# Patient Record
Sex: Male | Born: 1935 | Race: White | Hispanic: No | Marital: Married | Smoking: Never smoker
Health system: Southern US, Community
[De-identification: ages and names within clinical notes are randomized; demographics above are authoritative.]

## PROBLEM LIST (undated history)

## (undated) DIAGNOSIS — N179 Acute kidney failure, unspecified: Secondary | ICD-10-CM

## (undated) DIAGNOSIS — N400 Enlarged prostate without lower urinary tract symptoms: Secondary | ICD-10-CM

## (undated) DIAGNOSIS — Z978 Presence of other specified devices: Secondary | ICD-10-CM

## (undated) DIAGNOSIS — N189 Chronic kidney disease, unspecified: Secondary | ICD-10-CM

## (undated) DIAGNOSIS — H547 Unspecified visual loss: Secondary | ICD-10-CM

## (undated) DIAGNOSIS — H9193 Unspecified hearing loss, bilateral: Secondary | ICD-10-CM

## (undated) DIAGNOSIS — E039 Hypothyroidism, unspecified: Secondary | ICD-10-CM

## (undated) DIAGNOSIS — Z960 Presence of urogenital implants: Secondary | ICD-10-CM

## (undated) DIAGNOSIS — M1990 Unspecified osteoarthritis, unspecified site: Secondary | ICD-10-CM

## (undated) DIAGNOSIS — G709 Myoneural disorder, unspecified: Secondary | ICD-10-CM

## (undated) HISTORY — PX: HERNIA REPAIR: SHX51

## (undated) HISTORY — PX: KNEE ARTHROSCOPY WITH EXCISION BAKER'S CYST: SHX5646

## (undated) HISTORY — PX: VASECTOMY: SHX75

## (undated) HISTORY — PX: BACK SURGERY: SHX140

## (undated) HISTORY — PX: TESTICLE REMOVAL: SHX68

## (undated) HISTORY — PX: CATARACT EXTRACTION: SUR2

## (undated) HISTORY — PX: CARPAL TUNNEL RELEASE: SHX101

## (undated) HISTORY — PX: EYE SURGERY: SHX253

## (undated) HISTORY — PX: KYPHOPLASTY: SHX5884

## (undated) HISTORY — PX: SEPTOPLASTY: SUR1290

## (undated) HISTORY — PX: ABDOMINAL HERNIA REPAIR: SHX539

---

## 1997-09-12 ENCOUNTER — Emergency Department (HOSPITAL_COMMUNITY): Admission: EM | Admit: 1997-09-12 | Discharge: 1997-09-12 | Payer: Self-pay | Admitting: Emergency Medicine

## 1997-10-08 ENCOUNTER — Encounter: Payer: Self-pay | Admitting: Otolaryngology

## 1997-10-09 ENCOUNTER — Ambulatory Visit (HOSPITAL_COMMUNITY): Admission: RE | Admit: 1997-10-09 | Discharge: 1997-10-09 | Payer: Self-pay | Admitting: Otolaryngology

## 1999-10-06 ENCOUNTER — Ambulatory Visit (HOSPITAL_BASED_OUTPATIENT_CLINIC_OR_DEPARTMENT_OTHER): Admission: RE | Admit: 1999-10-06 | Discharge: 1999-10-06 | Payer: Self-pay | Admitting: Otolaryngology

## 2000-04-18 ENCOUNTER — Encounter: Admission: RE | Admit: 2000-04-18 | Discharge: 2000-04-18 | Payer: Self-pay | Admitting: Internal Medicine

## 2000-04-18 ENCOUNTER — Encounter: Payer: Self-pay | Admitting: Internal Medicine

## 2002-04-29 ENCOUNTER — Encounter: Payer: Self-pay | Admitting: Ophthalmology

## 2002-04-29 ENCOUNTER — Ambulatory Visit (HOSPITAL_COMMUNITY): Admission: RE | Admit: 2002-04-29 | Discharge: 2002-04-30 | Payer: Self-pay | Admitting: Ophthalmology

## 2002-08-19 ENCOUNTER — Encounter: Admission: RE | Admit: 2002-08-19 | Discharge: 2002-08-19 | Payer: Self-pay | Admitting: Internal Medicine

## 2002-08-19 ENCOUNTER — Encounter: Payer: Self-pay | Admitting: Internal Medicine

## 2002-09-18 ENCOUNTER — Encounter: Payer: Self-pay | Admitting: Internal Medicine

## 2002-09-18 ENCOUNTER — Encounter: Admission: RE | Admit: 2002-09-18 | Discharge: 2002-09-18 | Payer: Self-pay | Admitting: Internal Medicine

## 2002-10-17 ENCOUNTER — Ambulatory Visit (HOSPITAL_COMMUNITY): Admission: RE | Admit: 2002-10-17 | Discharge: 2002-10-17 | Payer: Self-pay | Admitting: Ophthalmology

## 2003-08-05 ENCOUNTER — Emergency Department (HOSPITAL_COMMUNITY): Admission: EM | Admit: 2003-08-05 | Discharge: 2003-08-05 | Payer: Self-pay | Admitting: Family Medicine

## 2003-11-27 ENCOUNTER — Encounter: Admission: RE | Admit: 2003-11-27 | Discharge: 2003-11-27 | Payer: Self-pay | Admitting: Neurosurgery

## 2003-11-28 ENCOUNTER — Encounter: Admission: RE | Admit: 2003-11-28 | Discharge: 2003-11-28 | Payer: Self-pay | Admitting: Orthopedic Surgery

## 2003-12-24 ENCOUNTER — Ambulatory Visit (HOSPITAL_COMMUNITY): Admission: RE | Admit: 2003-12-24 | Discharge: 2003-12-24 | Payer: Self-pay | Admitting: Gastroenterology

## 2003-12-24 ENCOUNTER — Encounter (INDEPENDENT_AMBULATORY_CARE_PROVIDER_SITE_OTHER): Payer: Self-pay | Admitting: Specialist

## 2004-08-19 ENCOUNTER — Ambulatory Visit (HOSPITAL_COMMUNITY): Admission: RE | Admit: 2004-08-19 | Discharge: 2004-08-19 | Payer: Self-pay | Admitting: Ophthalmology

## 2006-07-12 ENCOUNTER — Encounter: Admission: RE | Admit: 2006-07-12 | Discharge: 2006-07-12 | Payer: Self-pay | Admitting: Urology

## 2006-07-16 ENCOUNTER — Ambulatory Visit (HOSPITAL_BASED_OUTPATIENT_CLINIC_OR_DEPARTMENT_OTHER): Admission: RE | Admit: 2006-07-16 | Discharge: 2006-07-16 | Payer: Self-pay | Admitting: Urology

## 2006-07-16 ENCOUNTER — Encounter (INDEPENDENT_AMBULATORY_CARE_PROVIDER_SITE_OTHER): Payer: Self-pay | Admitting: Urology

## 2007-03-28 ENCOUNTER — Emergency Department (HOSPITAL_COMMUNITY): Admission: EM | Admit: 2007-03-28 | Discharge: 2007-03-28 | Payer: Self-pay | Admitting: Family Medicine

## 2007-03-29 ENCOUNTER — Emergency Department (HOSPITAL_COMMUNITY): Admission: EM | Admit: 2007-03-29 | Discharge: 2007-03-29 | Payer: Self-pay | Admitting: Emergency Medicine

## 2007-03-30 ENCOUNTER — Emergency Department (HOSPITAL_COMMUNITY): Admission: EM | Admit: 2007-03-30 | Discharge: 2007-03-30 | Payer: Self-pay | Admitting: Emergency Medicine

## 2007-05-18 ENCOUNTER — Emergency Department (HOSPITAL_COMMUNITY): Admission: EM | Admit: 2007-05-18 | Discharge: 2007-05-18 | Payer: Self-pay | Admitting: Family Medicine

## 2007-08-03 ENCOUNTER — Emergency Department (HOSPITAL_COMMUNITY): Admission: EM | Admit: 2007-08-03 | Discharge: 2007-08-03 | Payer: Self-pay | Admitting: Emergency Medicine

## 2008-05-01 ENCOUNTER — Encounter: Admission: RE | Admit: 2008-05-01 | Discharge: 2008-05-01 | Payer: Self-pay | Admitting: Internal Medicine

## 2010-03-06 ENCOUNTER — Encounter: Payer: Self-pay | Admitting: Orthopedic Surgery

## 2010-06-28 NOTE — Op Note (Signed)
[name], [name]               ACCOUNT NO.:  [id number]   MEDICAL RECORD NO.:  [id number]          PATIENT TYPE:  AMB   LOCATION:  [facility]                         FACILITY:  [facility]   PHYSICIAN:  Mark C. Vernie Ammons, M.D.  DATE OF BIRTH:  [birth year]   DATE OF PROCEDURE:  07/16/2006  DATE OF DISCHARGE:                               OPERATIVE REPORT   PREOP DIAGNOSIS:  Chronic left testicular pain.   POSTOP DIAGNOSIS:  Chronic left testicular pain.   PROCEDURE:  Scrotal exploration and left simple orchiectomy.   SURGEON:  Mark C. Vernie Ammons, M.D.   ANESTHESIA:  General with local supplements.   SPECIMENS:  Left testicle to pathology.   BLOOD LOSS:  Less than 1 mL.   COMPLICATIONS:  None.   INDICATIONS:  The patient is a 77 year old white male who has had a  longstanding chronic left testicular pain.  I have tried multiple  antibiotics without improvement.  It had gotten to the point where I was  trying just to control the pain with Lyrica and that was marginally  beneficial.  I have discussed with the patient the options; and we have  elected to proceed with removal of the testicle.  The risks,  complication, and alternatives were discussed.  The patient understands  and elects to proceed.   DESCRIPTION OF OPERATION:  After informed consent, the patient was  brought to the major OR, and placed on the table and administered  general anesthesia.  Scrotum was sterilely prepped and draped.  A  midline median raphe skin incision was then made; and scrotal  exploration was undertaken.  Once entering the skin.  I then explored  the left hemi scrotum; and found no definite abnormality within.  The  testicle was palpably normal although somewhat atrophic.  I then  delivered the testicle and further exploration revealed no definite  masses or growths associated with the testicle.   I then used blunt dissection to dissect up the cord nearly to the  external inguinal ring.  I then divided  the cord into two equal  portions; and used Kelly clamps on each portion dividing the cord  distally to the clamps.  I then doubly ligated each portion of the cord  with 2-0 Vicryl suture first placing a 2-0 Vicryl tie, then a 2-0 Vicryl  suture ligature.  No bleeding was noted; and the cord was allowed to  drop back into its normal anatomic position.  Further exploration of the  scrotum revealed no abnormality within the scrotum and no bleeding  points were noted.   I injected a total of 12 mL of 1/2% plain Marcaine in the subcu tissue  and closed the skin with running 3-0 chromic suture.  A sterile dressing  as well as fluffed Kerlix and scrotal support were applied.  The patient  was then awakened and taken to recovery in stable satisfactory  condition.  He tolerated the procedure well; there were no  intraoperative complications.   He will be given a prescription for Vicoprofen 1-2 q.4 h. p.r.n. pain  #30.  He will follow up in  my office in 2 weeks; and I will contact him  with results of the pathology as his left testicle and associated  spermatic cord have been sent to pathology for pathologic evaluation.      Mark C. Vernie Ammons, M.D.  Electronically Signed     MCO/MEDQ  D:  07/16/2006  T:  07/16/2006  Job:  [id number]

## 2010-07-01 NOTE — Op Note (Signed)
NAME:  [name], [name]                         ACCOUNT NO.:  [id number]   MEDICAL RECORD NO.:  [id number]                   PATIENT TYPE:  OIB   LOCATION:  5735                                 FACILITY:  [facility]   PHYSICIAN:  Guadelupe Sabin, M.D.             DATE OF BIRTH:  September 19, 1925   DATE OF PROCEDURE:  04/29/2002  DATE OF DISCHARGE:  04/30/2002                                 OPERATIVE REPORT   PREOPERATIVE DIAGNOSIS:  Rhegmatogenous retinal detachment, left eye.   POSTOPERATIVE DIAGNOSIS:  Rhegmatogenous retinal detachment, left eye.   OPERATION PERFORMED:  1. Scleral buckling procedure, left eye, using solid silicone implants, #277     and 240.  2. Cryoapplication and diathermy application, external drainage of     subretinal fluid.   SURGEON:  Guadelupe Sabin, M.D.   ASSISTANT:  Nurse.   ANESTHESIA:  General.   OPHTHALMOSCOPY:  As previously described.   DESCRIPTION OF OPERATION:  After the patient was prepped and draped lid  traction sutures were placed in the left upper and lower lids.  The lid  speculum was inserted.  A peritomy was performed adjacent to the limbus 360  degrees.  The subconjunctival tissue was cleaned and the rectus muscles  isolated and looped with 4-0 silk traction sutures.  The sclera was  inspected and felt to be of satisfactory thickness for lamellar scleral  dissection.  Localization of the retinal tear was then performed using the  retinal cryoprobe and the tear itself treated with cryoapplications.  Lamellar scleral dissection was then carried out from the 10 to 2 o'clock  position, the bed measuring 9 mm in width.  Light diathermy applications  were applied to the inner scleral lamella.  A total of three 4-0 green  Mersilene sutures were used to close the scleral flap loosely over a trimmed  #277 solid silicone implant.  A 240 solid silicone encircling band was  placed about the globe, tied with two sutures of 4-0 green Mersilene at  the  4 o'clock position and anchoring sutures of 5-0 white dacron were placed at  the 4:30 and 8 o'clock positions to hold the encircling band in place.   After repeat indirect ophthalmoscopy it was elected to drain fluid at the  12:30 position in the bed.  An incision was made through the inner scleral  lamella, the choroid treated with direct diathermy applications and then  perforated with the 10 electrode.  A moderate amount of subretinal fluid  drained.  The retina was inspected revealing flattening of the retina.  An  additional site, however, was then utilized as there was no subretinal fluid  remaining at the previously treated areas.  A second site was performed at  the 1:30 position at the end of the buckle.  Perforation again revealed  further drainage of subretinal fluid.  The eye was becoming quite hypotonus  and it was elected to  inject 1 mL of filtered air through a pars plana  injection with a 30-gauge needle.  This reestablished the intraocular  pressure.  Fundus inspection revealed flattening of the retina, although  there still was a meridional wrinkle at the 12:30 position.  It was felt  that with the intraocular tamponade of the air bubble that the retina would  further settle in place.  It was therefore elected to close.   The tension of the encircling band was adjusted.  Tenon's capsule was pulled  forward in the four quadrants and tied as a separate layer with a 6-0  chromic catgut suture.  The conjunctiva was then pulled forward with a  running 6-0 chromic catgut suture.  Depo-Garamycin and dexamethasone were  injected in the sub-Tenon's space inferiorly.  Maxitrol and atropine  ointments were instilled in the conjunctival cul-de-sac.  A light patch and  protective shield were applied to the operated left eye.   DURATION OF PROCEDURE:  One-and-one-half hours.   CONDITION:  The patient tolerated the procedure well in general and left the  operating room for the  recovery room in good condition.  The patient was  taken to the recovery room and subsequently to the 23-hour observation unit.  The patient was to be positioned on his back with the head elevated at least  at 45 degrees to allow the intraocular air bubble to tamponade the break.  Schiotz tonometry at the end of the procedure was recorded at five to six  scale units with a 5.5 gram weight indicating a normotensive eye.                                                 Guadelupe Sabin, M.D.    HNJ/MEDQ  D:  07/25/2002  T:  07/25/2002  Job:  [id number]

## 2010-07-01 NOTE — Op Note (Signed)
NAMEFIELD, STANISZEWSKI NO.:  [id number]   MEDICAL RECORD NO.:  [id number]          PATIENT TYPE:  OIB   LOCATION:  2899                         FACILITY:  [facility]   PHYSICIAN:  Guadelupe Sabin, M.D.DATE OF BIRTH:  January 24, 1926   DATE OF PROCEDURE:  DATE OF DISCHARGE:                                 OPERATIVE REPORT   PREOPERATIVE DIAGNOSIS:  Senile nuclear brunescent cataract, right eye.   POSTOPERATIVE DIAGNOSIS:  Senile nuclear brunescent cataract, right eye.   DATE OF OPERATION:  Planned extracapsular cataract extraction,  phacoemulsification, primary insertion of posterior chamber intraocular lens  implant.   SURGEON:  Guadelupe Sabin, M.D.   ASSISTANT:  Nurse.   ANESTHESIA:  Local 4% Xylocaine, 0.75% Marcaine retrobulbar block was Wydase  added, topical tetracaine, intraocular Xylocaine, anesthesia standby  required in this patient.  The patient was given sodium Pentothal  intravenously during the period of retrobulbar blocking.   OPERATIVE PROCEDURE:  After the patient was prepped and draped, a lid  speculum was inserted in the right eye.  The eye was turned downward and a  superior rectus traction suture placed.  Schiotz tonometry was recorded at  seven scale units with a 5.5 g weight.  A peritomy was performed adjacent to  the limbus from the 11 to 1 o'clock position.  The corneoscleral junction  was cleaned and a corneoscleral groove made with a 45 degree Superblade.  The anterior chamber was then entered with a 2.5 mm diamond keratome at the  12 o'clock position and a 15 degrees blade at the 2:30 position.  Using a  bent 26 gauge needle on an Ocucoat syringe, a circular capsulorrhexis was  begun and then completed with the Utrata forceps.  Hydrodissection and  hydrodelineation were performed using 1% Xylocaine.  The 30 degree  phacoemulsification tip was then inserted with slow emulsification of the  brunescent, firm nucleus.  Back-cracking was  achieved with the Bechert pick.  Total ultrasonic time 1 minute 59 seconds, average power level 18%, total  amount of fluid used 90 mL.  Following removal of the nucleus, the residual  cortex was aspirated with the silicone-tipped irrigation-aspiration probe.  The posterior capsule appeared intact.  It was therefore elected to insert  an [employer] SI40NB silicone three-piece posterior chamber  intraocular lens implant, diopter strength +17.00.  This was inserted with  the McDonald forceps into the anterior chamber and then centered into the  capsular bag using the [facility] lens rotator.  The lens appeared to be well-  centered.  The Ocucoat and Provisc which had been used intermittently during  the procedures were aspirated and replaced with balanced salt solution and  Miochol ophthalmic solution.  The operative incisions appeared to be self-  sealing.  It was, however, elected to place a single 10-0 interrupted nylon  suture across the 12 o'clock incision to ensure closure and  to prevent endophthalmitis.  Pilopine ophthalmic gel and Maxitrol ointment  were instilled in the conjunctival cul-de-sac and a light patch and  protective shield applied.  Duration of procedure and anesthesia  administration 45  minutes.  The patient tolerated the procedure well in  general, left the operating room for the recovery room in good condition.       HNJ/MEDQ  D:  08/19/2004  T:  08/19/2004  Job:  [id number]

## 2010-07-01 NOTE — H&P (Signed)
NAME:  [name], [name]                         ACCOUNT NO.:  [id number]   MEDICAL RECORD NO.:  [id number]                   PATIENT TYPE:  OIB   LOCATION:  5735                                 FACILITY:  [facility]   PHYSICIAN:  Guadelupe Sabin, M.D.             DATE OF BIRTH:  [birth year]   DATE OF ADMISSION:  04/29/2002  DATE OF DISCHARGE:  04/30/2002                                HISTORY & PHYSICAL   IDENTIFYING INFORMATION/JUSTIFICATION FOR ADMISSION AND CARE:  This is an  urgent outpatient admission of this 77 year old white male admitted with a  rhegmatogenous retinal detachment of the left eye.   PRESENT ILLNESS:  This patient has been followed in my office intermittently  since December 08, 1993.  At that time the patient was complaining of  floaters and flashes in his right eye.  Examination revealed an acute  posterior vitreous detachment with mild vitreous hemorrhage associated with  early cataract formation.  The patient limited his activity and no retinal  detachment developed.  The patient, however, began to note the sudden onset  of similar symptoms in his left eye.  He returned to the office and was  found to have a superior rhegmatogenous retinal detachment with a horseshoe  tear located at the 12:30 position.  The patient was given oral discussion  and printed information concerning the proposed procedure and its possible  complications.  The patient signed an informed consent and arrangements were  made for his outpatient admission at this time.   PAST MEDICAL HISTORY:  The patient has also had recent cataract implant  surgery of the left eye performed by Dr. Cherly Hensen in October 2003.   REVIEW OF SYSTEMS:  No cardiorespiratory complaints.   MEDICATIONS:  The patient has been taking levofloxacin from Dr. Vernie Ammons for  a recent urinary tract infection.  Also the patient takes Cardura one tablet  at bedtime and doxycycline.  He has been taking Refresh ophthalmic  solution  as needed for eye dryness.   ALLERGIES:  SULFA and CIPRO.   PHYSICAL EXAMINATION:  VITAL SIGNS:  As recorded on admission.  Blood  pressure 132/79, heart rate 107 and temperature 98.8.  GENERAL APPEARANCE:  The patient is a pleasant, well-nourished, well-  developed white male in no acute distress, except for his eyes.  HEENT:  Eyes:  Visual acuity with correction 20/40 +2, right eye; less than  20/400, finger counting, left eye.  The patient has an inferior half or more  visual field defect in the left eye.  Applanation tonometry 16 mm right eye  and 18 left eye.  External ocular and slit lamp examination:  The eyes are  what and clear.  A cataract incision can be seen in the left eye at the 11  o'clock position.  The corneae are clear.  Anterior chamber deep and clear.  A dense nuclear cataract is present in the right eye and  there appears to be  some residual cortex remaining in the left eye.  A posterior chamber  intraocular lens implant is present and the posterior capsule appears open.  Detailed fundus examination:  The right eye dilated; a dim view due to dense  cataract formation.  The vitreous is clear.  The retina is attached and no  retinal tear or detachment area is seen.  Left eye; the vitreous is clear.  The retina is detached with a bullous rhegmatogenous retinal detachment  superiorly with a horseshoe tear at the 12:30 position.  The macular area is  detachment.  CHEST AND LUNGS:  Clear to percussion and auscultation.  HEART:  Normal sinus rhythm.  No cardiomegaly.  No murmurs.  ABDOMEN: Negative.  EXTREMITIES: Negative.   ADMISSION DIAGNOSES:  1. Rhegmatogenous retinal detachment, left eye.  2. Pseudophakia, left eye.   SURGICAL PLAN:  A scleral buckling procedure, left eye, with possible  vitrectomy.                                                 Guadelupe Sabin, M.D.    HNJ/MEDQ  D:  07/25/2002  T:  07/25/2002  Job:  [id number]

## 2010-07-01 NOTE — H&P (Signed)
NAME:  [name], [name]                         ACCOUNT NO.:  [id number]   MEDICAL RECORD NO.:  [id number]                   PATIENT TYPE:  OIB   LOCATION:  2875                                 FACILITY:  [facility]   PHYSICIAN:  Guadelupe Sabin, M.D.             DATE OF BIRTH:  17-May-1925   DATE OF ADMISSION:  10/17/2002  DATE OF DISCHARGE:  10/17/2002                                HISTORY & PHYSICAL   REASON FOR ADMISSION:  This was an urgent outpatient admission of this heavy  77 year old white male admitted with exposure of retinal silicone implants  following retinal detachment surgery.   HISTORY OF PRESENT ILLNESS:  This patient was previously admitted to this  hospital on 04/29/02 and underwent a scleral buckling procedure of the left  eye with solid silicone implants, cryoapplication, diathermy application,  and drainage of subretinal fluid (see detailed admission and operative  note).  The patient was discharged from the hospital and continued to do  well with slow improvement of vision.  Recently, when he returned to the  office for a routine follow-up examination, it was noted that the patient  complained of some watering and irritation of the operated left eye and on  examination there appeared to be exposure of the silicone implants and  retraction of the conjunctiva.  A loose suture was removed from the  conjunctival dehiscence.  It was felt that the patient warranted covering of  the exposed implant which would require further surgery.  The patient was  placed on Tobrex ophthalmic solution until surgery at this time.   PAST MEDICAL HISTORY:  Stable general health, see old chart.   REVIEW OF SYSTEMS:  No current cardiorespiratory complaints.   PHYSICAL EXAMINATION:  VITAL SIGNS:  As recorded on admission, blood  pressure 140/78, respirations 18, heart rate 99, temperature 97.  GENERAL APPEARANCE:  The patient is a pleasant well-developed, well-  nourished white male in  acute distress.  HEENT:  Eyes - visual acuity on 10/14/02 20/30 right eye, 20/40 + 3 left eye.  External ocular and slit lamp examination of the right eye is normal, the  left eye shows no purulent discharge.  On elevation of the lid, the area of  dehiscence and retraction of the conjunctiva is noted with a small oval  opening exposing the underlying silicone implants.  CHEST:  Lungs clear to percussion and auscultation.  HEART:  Normal sinus rhythm, no cardiomegaly, no murmurs.  ABDOMEN:  Negative.  EXTREMITIES:  Negative.   ADMISSION DIAGNOSIS:  Exposure of solid silicone implants.   SURGICAL PLAN:  Conjunctival repair and use of scleral patch graft.  The  patient has been given oral discussion concerning the operative procedure,  he signed an informed consent and arrangements were made for his outpatient  admission at this time.  Guadelupe Sabin, M.D.   HNJ/MEDQ  D:  10/17/2002  T:  10/18/2002  Job:  [id number]

## 2010-07-01 NOTE — Op Note (Signed)
NAMEGARETT, [name]               ACCOUNT NO.:  [id number]   MEDICAL RECORD NO.:  [id number]          PATIENT TYPE:  AMB   LOCATION:  ENDO                         [facility]ACILITY[facility]   PHYSICIAN:  Danise Edge, M.D.   DATE OF BIRTH:  [birth year]   DATE OF PROCEDURE:  12/24/2003  DATE OF DISCHARGE:                                 OPERATIVE REPORT   PROCEDURE:  Screening colonoscopy.   PROCEDURE INDICATION:  Mr. Ryley Bachtel is a 77 year old male, born  12/25/1925.  Mr. Langan is scheduled to undergo his first screening  colonoscopy with polypectomy to prevent colon cancer.   ENDOSCOPIST:  Danise Edge, M.D.   PREMEDICATION:  1.  Versed 6 mg.  2.  Demerol 60 mg.   DESCRIPTION OF PROCEDURE:  After obtaining informed consent, Mr. Rossa was  placed in the left lateral decubitus position.  I administered intravenous  Demerol and intravenous Versed to achieve conscious sedation for the  procedure.  The patient's blood pressure, oxygen saturation, and cardiac  rhythm were monitored throughout the procedure and documented in the medical  record.   Anal inspection was normal.  Digital rectal exam revealed an enlarged  prostate.  The Olympus adjustable pediatric colonoscope was introduced into  the rectum and advanced to the cecum.  Colonic preparation for the exam  today was satisfactory.   RECTUM:  A diminutive polyp was removed from the proximal rectum with the  cold biopsy forceps.  SIGMOID COLON AND DESCENDING COLON:  Left colonic diverticulosis.  SPLENIC FLEXURE:  Normal.  TRANSVERSE COLON:  Normal.  HEPATIC FLEXURE:  Normal.  ASCENDING COLON:  Normal.  CECUM AND ILEOCECAL VALVE:  Normal.   ASSESSMENT:  1.  A diminutive polyp was removed from the distal rectum.  2.  Left colonic diverticulosis noted.      MJ/MEDQ  D:  12/24/2003  T:  12/24/2003  Job:  [id number]   cc:   Georgann Housekeeper, MD  [address]  [place]  [place] [address]  Fax: [phone]

## 2010-07-01 NOTE — H&P (Signed)
[name], [name] NO.:  [id number]   MEDICAL RECORD NO.:  [id number]          PATIENT TYPE:  OIB   LOCATION:  2899                         FACILITY:  [facility]   PHYSICIAN:  Guadelupe Sabin, M.D.DATE OF BIRTH:  01/27/1926   DATE OF ADMISSION:  08/19/2004  DATE OF DISCHARGE:                                HISTORY & PHYSICAL   This was a planned outpatient readmission of this 77 year old white male  admitted for cataract implant surgery of the right eye.   PRESENT ILLNESS:  This patient has a past history of progressive cataract  formation in both eyes.  The patient underwent complicated cataract implant  surgery of the left eye in October 2003 by Dr. Cherly Hensen.  The patient did well,  however, until April 28, 2002, when a retinal detachment developed in the  operated eye and the patient underwent a scleral buckling procedure.  The  patient had retinal reattachment health with a good return of vision.  In  September 2004, an exposure of the silicone retinal implant was noted and  the patient was readmitted on October 17, 2002, for a scleral patch repair.  The patient again continued to do well with return of vision to 20/30.  The  patient, however, gradually was noted to have deteriorating vision in the  unoperated right eye due to progressive brunescent nuclear cataract  formation.  The patient has elected to proceed with similar cataract implant  surgery of the right eye at this time.  He has signed an informed consent  and arrangements made for his readmission at this time.   PAST MEDICAL HISTORY:  The patient is in stable general health, taking  multiple medications including Crestor, doxazosin, multivitamin tablets,  Lunesta, UroXatral,  and Cardura for prostate enlargement.  He has been  taking FML ophthalmic solution one drop to the left eye each day.   ALLERGIES:  CIPRO.   REVIEW OF SYSTEMS:  No cardiorespiratory complaints.   PHYSICAL EXAMINATION:   VITAL SIGNS:  As recorded on admission, blood  pressure 131/85, pulse 97, respirations 20, temperature 97.7.  GENERAL APPEARANCE:  The patient is a pleasant, well-nourished, well-  developed white male in no acute distress.  HEENT:  EYES:  Visual acuity 20/40 right eye, 20/30 +2, left eye.  Applanation tonometry 18 mm right eye, 26 left eye.  External ocular:  The  eyes are white and clear, and there is no exposure of the silicone implant  in the left eye.  The cornea is clear, anterior chamber deep and clear.  A  dense brunescent nuclear cataract is present in the right eye and a  posterior chamber implant in the left eye with a clear posterior capsule.  Dilated detailed fundus examination shows a clear vitreous behind the  cataractous lens in the right eye.  There are no retinal tears or detachment  areas present.  The left eye shows a clear vitreous with an old scleral  buckling procedure in the retinal periphery.  The optic nerve, blood vessels  and macula appear normal.  Disk/cup ratio 0.5.  CHEST:  Lungs clear  to percussion and auscultation.  HEART:  Normal sinus rhythm, no cardiomegaly. No murmurs.  ABDOMEN:  Negative.  EXTREMITIES:  Negative.   ADMISSION DIAGNOSES:  1.  Senile brunescent cataract, right eye.  2.  Pseudophakia, left eye status, post scleral buckling procedure, left      eye, for retinal detachment.   SURGICAL PLAN:  Cataract implant surgery, right eye.       HNJ/MEDQ  D:  08/19/2004  T:  08/19/2004  Job:  [id number]

## 2010-07-01 NOTE — Discharge Summary (Signed)
   NAME:  [name], [name]                         ACCOUNT NO.:  [id number]   MEDICAL RECORD NO.:  [id number]                   PATIENT TYPE:  OIB   LOCATION:  5735                                 FACILITY:  [facility]   PHYSICIAN:  Guadelupe Sabin, M.D.             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   HOSPITAL COURSE:  The patient was evaluated preoperatively and felt to be in  satisfactory condition for the proposed surgery.  He therefore was taken  into the operating room where a scleral buckling procedure was performed on  the left eye under general anesthesia without complication.  Intraocular air  was utilized in addition to the scleral buckling.  The patient was taken to  the recovery room and subsequently to the 23-hour observation unit.  It was  noted that the patient had had a previous urinary tract infection or  inflammation was having some difficulty with benign prostatic hypertrophy.  The patient was place on Flomax.  The patient was placed in the sitting  position with the head elevated.  He was checked on the evening of surgery  and was having some difficulty with urination - urinary retention.  In and  out catheterization was ordered as needed.  By the following morning the  patient felt that he was doing well and that he could be discharged home to  be followed in the office.  The patient was cautioned that should urinary  retention continue that he should contact his urologist, Dr. Vernie Ammons.   DISCHARGE MEDICATIONS:  Discharge ocular medications included TobraDex and  Cyclomydril ophthalmic solutions one drop four times a day five minutes  apart, and atropine and Maxitrol ointments at bedtime.   DISCHARGE CONDITION:  Condition on discharge; improved.   FOLLOW UP:  Follow up appointment in my office in three to five days.   DISCHARGE DIAGNOSES:  1. Rhegmatogenous retinal detachment, left eye.  2. Pseudophakia, left eye.                                                 Guadelupe Sabin, M.D.    HNJ/MEDQ  D:  07/25/2002  T:  07/25/2002  Job:  [id number]

## 2010-07-01 NOTE — Op Note (Signed)
[name], [name]                         ACCOUNT NO.:  [id number]   MEDICAL RECORD NO.:  [id number]                   PATIENT TYPE:  OIB   LOCATION:  2875                                 FACILITY:  [facility]   PHYSICIAN:  Guadelupe Sabin, M.D.             DATE OF BIRTH:  1925/05/08   DATE OF PROCEDURE:  10/17/2002  DATE OF DISCHARGE:  10/17/2002                                 OPERATIVE REPORT   PREOPERATIVE DIAGNOSIS:  Exposure of retinal silicone implants, left eye.   POSTOPERATIVE DIAGNOSIS:  Exposure of retinal silicone implants, left eye.   OPERATION:  Conjunctival repair with scleral eye bank patch graft.   SURGEON:  Guadelupe Sabin, M.D.   ASSISTANT:  Nurse.   ANESTHESIA:  Local, 4% Xylocaine, 0.75% Marcaine retrobulbar block, lid  akinesia.  Anesthesia standby required in this patient.  The patient was  given Ditropan intravenously during the period of retrobulbar blocking.   DESCRIPTION OF PROCEDURE:  After the patient was prepped and draped, a lid  speculum was inserted in the left eye.  A detailed fundus examination with  indirect ophthalmoscopy was performed.  The retina appeared to be in place,  reattached with a good scleral buckling superior indentation.  Following  indirect ophthalmoscopy, the sclera was inspected externally, revealing the  small linear dehiscence.  It appeared that the entire superior conjunctiva  had retracted away from the limbus, causing the exposure of the implant  material.  The conjunctiva was then freed up over the superior 180 degrees.  An operative culture was obtained.  There appeared to be no purulent  discharge present.  Neosporin was used to irrigate the eye following the  culture.  A piece of sclera from the eye bank measuring approximately 5 x 8  mm in length was fashioned to cover the exposed implant area.  This was  attached to the sclera with two 7-0 Vicryl sutures to insure that the patch  graft over the silicone did  not dislocate.  The underlying conjunctiva was  then freed up and the anterior edge of the conjunctiva incised along the  corneal limbus.  The conjunctiva was then pulled forward with the tenon's  capsule with approximately four 6-0 chromic catgut sutures.  The conjunctiva  itself was then pulled forward and a running 7-0 Vicryl suture used to close  the conjunctival opening over the superior 180 degrees.  Depo, Garamycin and  dexamethasone were injected in the subtenon space inferiorly.  Maxitrol and  Atropine ointment were instilled in the conjunctival cul-de-sac.  A light  patch and protective shield were applied to the operated left eye.   DURATION OF PROCEDURE:  45-60 minutes.   DISPOSITION:  The patient tolerated the procedure well in general and left  the operating room for the recovery room in good condition.  Guadelupe Sabin, M.D.    HNJ/MEDQ  D:  10/17/2002  T:  10/18/2002  Job:  [id number]

## 2010-10-24 ENCOUNTER — Encounter (INDEPENDENT_AMBULATORY_CARE_PROVIDER_SITE_OTHER): Payer: Medicare Other | Admitting: Ophthalmology

## 2010-10-24 DIAGNOSIS — H43819 Vitreous degeneration, unspecified eye: Secondary | ICD-10-CM

## 2010-10-24 DIAGNOSIS — H33009 Unspecified retinal detachment with retinal break, unspecified eye: Secondary | ICD-10-CM

## 2010-11-10 LAB — URINALYSIS, ROUTINE W REFLEX MICROSCOPIC
Bilirubin Urine: NEGATIVE
Hgb urine dipstick: NEGATIVE
Nitrite: NEGATIVE
Protein, ur: NEGATIVE
Specific Gravity, Urine: 1.02
Urobilinogen, UA: 0.2

## 2010-12-01 LAB — POCT HEMOGLOBIN-HEMACUE
Hemoglobin: 15.1
Operator id: 268271

## 2011-02-17 DIAGNOSIS — H01009 Unspecified blepharitis unspecified eye, unspecified eyelid: Secondary | ICD-10-CM | POA: Diagnosis not present

## 2011-02-17 DIAGNOSIS — H4030X0 Glaucoma secondary to eye trauma, unspecified eye, stage unspecified: Secondary | ICD-10-CM | POA: Diagnosis not present

## 2011-02-17 DIAGNOSIS — S0590XA Unspecified injury of unspecified eye and orbit, initial encounter: Secondary | ICD-10-CM | POA: Diagnosis not present

## 2011-04-26 DIAGNOSIS — R972 Elevated prostate specific antigen [PSA]: Secondary | ICD-10-CM | POA: Diagnosis not present

## 2011-04-26 DIAGNOSIS — N401 Enlarged prostate with lower urinary tract symptoms: Secondary | ICD-10-CM | POA: Diagnosis not present

## 2011-05-01 DIAGNOSIS — R972 Elevated prostate specific antigen [PSA]: Secondary | ICD-10-CM | POA: Diagnosis not present

## 2011-05-01 DIAGNOSIS — N401 Enlarged prostate with lower urinary tract symptoms: Secondary | ICD-10-CM | POA: Diagnosis not present

## 2011-05-16 DIAGNOSIS — G479 Sleep disorder, unspecified: Secondary | ICD-10-CM | POA: Diagnosis not present

## 2011-05-16 DIAGNOSIS — M1990 Unspecified osteoarthritis, unspecified site: Secondary | ICD-10-CM | POA: Diagnosis not present

## 2011-05-16 DIAGNOSIS — N400 Enlarged prostate without lower urinary tract symptoms: Secondary | ICD-10-CM | POA: Diagnosis not present

## 2011-05-16 DIAGNOSIS — E782 Mixed hyperlipidemia: Secondary | ICD-10-CM | POA: Diagnosis not present

## 2011-05-16 DIAGNOSIS — Z1331 Encounter for screening for depression: Secondary | ICD-10-CM | POA: Diagnosis not present

## 2011-06-20 DIAGNOSIS — T1510XA Foreign body in conjunctival sac, unspecified eye, initial encounter: Secondary | ICD-10-CM | POA: Diagnosis not present

## 2011-06-20 DIAGNOSIS — H01009 Unspecified blepharitis unspecified eye, unspecified eyelid: Secondary | ICD-10-CM | POA: Diagnosis not present

## 2011-06-20 DIAGNOSIS — H409 Unspecified glaucoma: Secondary | ICD-10-CM | POA: Diagnosis not present

## 2011-06-20 DIAGNOSIS — H4011X0 Primary open-angle glaucoma, stage unspecified: Secondary | ICD-10-CM | POA: Diagnosis not present

## 2011-09-14 DIAGNOSIS — T1510XA Foreign body in conjunctival sac, unspecified eye, initial encounter: Secondary | ICD-10-CM | POA: Diagnosis not present

## 2011-10-20 DIAGNOSIS — H409 Unspecified glaucoma: Secondary | ICD-10-CM | POA: Diagnosis not present

## 2011-10-20 DIAGNOSIS — H01009 Unspecified blepharitis unspecified eye, unspecified eyelid: Secondary | ICD-10-CM | POA: Diagnosis not present

## 2011-10-20 DIAGNOSIS — H35379 Puckering of macula, unspecified eye: Secondary | ICD-10-CM | POA: Diagnosis not present

## 2011-10-20 DIAGNOSIS — H4011X0 Primary open-angle glaucoma, stage unspecified: Secondary | ICD-10-CM | POA: Diagnosis not present

## 2011-10-23 ENCOUNTER — Encounter (INDEPENDENT_AMBULATORY_CARE_PROVIDER_SITE_OTHER): Payer: Medicare Other | Admitting: Ophthalmology

## 2011-11-23 DIAGNOSIS — Z1211 Encounter for screening for malignant neoplasm of colon: Secondary | ICD-10-CM | POA: Diagnosis not present

## 2011-11-23 DIAGNOSIS — G479 Sleep disorder, unspecified: Secondary | ICD-10-CM | POA: Diagnosis not present

## 2011-11-23 DIAGNOSIS — R7309 Other abnormal glucose: Secondary | ICD-10-CM | POA: Diagnosis not present

## 2011-11-23 DIAGNOSIS — R972 Elevated prostate specific antigen [PSA]: Secondary | ICD-10-CM | POA: Diagnosis not present

## 2011-11-23 DIAGNOSIS — Z1331 Encounter for screening for depression: Secondary | ICD-10-CM | POA: Diagnosis not present

## 2011-11-23 DIAGNOSIS — N400 Enlarged prostate without lower urinary tract symptoms: Secondary | ICD-10-CM | POA: Diagnosis not present

## 2011-11-23 DIAGNOSIS — R634 Abnormal weight loss: Secondary | ICD-10-CM | POA: Diagnosis not present

## 2011-11-23 DIAGNOSIS — Z0000 Encounter for general adult medical examination without abnormal findings: Secondary | ICD-10-CM | POA: Diagnosis not present

## 2011-11-23 DIAGNOSIS — J309 Allergic rhinitis, unspecified: Secondary | ICD-10-CM | POA: Diagnosis not present

## 2011-11-23 DIAGNOSIS — E782 Mixed hyperlipidemia: Secondary | ICD-10-CM | POA: Diagnosis not present

## 2011-11-29 DIAGNOSIS — Z1211 Encounter for screening for malignant neoplasm of colon: Secondary | ICD-10-CM | POA: Diagnosis not present

## 2011-12-08 DIAGNOSIS — L570 Actinic keratosis: Secondary | ICD-10-CM | POA: Diagnosis not present

## 2011-12-08 DIAGNOSIS — C44320 Squamous cell carcinoma of skin of unspecified parts of face: Secondary | ICD-10-CM | POA: Diagnosis not present

## 2011-12-08 DIAGNOSIS — D235 Other benign neoplasm of skin of trunk: Secondary | ICD-10-CM | POA: Diagnosis not present

## 2011-12-08 DIAGNOSIS — B351 Tinea unguium: Secondary | ICD-10-CM | POA: Diagnosis not present

## 2012-01-10 DIAGNOSIS — L821 Other seborrheic keratosis: Secondary | ICD-10-CM | POA: Diagnosis not present

## 2012-01-10 DIAGNOSIS — Z85828 Personal history of other malignant neoplasm of skin: Secondary | ICD-10-CM | POA: Diagnosis not present

## 2012-01-10 DIAGNOSIS — L570 Actinic keratosis: Secondary | ICD-10-CM | POA: Diagnosis not present

## 2012-02-22 DIAGNOSIS — H4011X0 Primary open-angle glaucoma, stage unspecified: Secondary | ICD-10-CM | POA: Diagnosis not present

## 2012-02-22 DIAGNOSIS — T1510XA Foreign body in conjunctival sac, unspecified eye, initial encounter: Secondary | ICD-10-CM | POA: Diagnosis not present

## 2012-02-22 DIAGNOSIS — H409 Unspecified glaucoma: Secondary | ICD-10-CM | POA: Diagnosis not present

## 2012-03-26 DIAGNOSIS — G479 Sleep disorder, unspecified: Secondary | ICD-10-CM | POA: Diagnosis not present

## 2012-03-26 DIAGNOSIS — N400 Enlarged prostate without lower urinary tract symptoms: Secondary | ICD-10-CM | POA: Diagnosis not present

## 2012-03-26 DIAGNOSIS — E782 Mixed hyperlipidemia: Secondary | ICD-10-CM | POA: Diagnosis not present

## 2012-03-26 DIAGNOSIS — J309 Allergic rhinitis, unspecified: Secondary | ICD-10-CM | POA: Diagnosis not present

## 2012-05-07 DIAGNOSIS — N401 Enlarged prostate with lower urinary tract symptoms: Secondary | ICD-10-CM | POA: Diagnosis not present

## 2012-05-16 DIAGNOSIS — M25519 Pain in unspecified shoulder: Secondary | ICD-10-CM | POA: Diagnosis not present

## 2012-08-22 DIAGNOSIS — H4011X0 Primary open-angle glaucoma, stage unspecified: Secondary | ICD-10-CM | POA: Diagnosis not present

## 2012-08-22 DIAGNOSIS — H01009 Unspecified blepharitis unspecified eye, unspecified eyelid: Secondary | ICD-10-CM | POA: Diagnosis not present

## 2012-08-22 DIAGNOSIS — T1510XA Foreign body in conjunctival sac, unspecified eye, initial encounter: Secondary | ICD-10-CM | POA: Diagnosis not present

## 2012-08-22 DIAGNOSIS — H409 Unspecified glaucoma: Secondary | ICD-10-CM | POA: Diagnosis not present

## 2012-09-24 DIAGNOSIS — N318 Other neuromuscular dysfunction of bladder: Secondary | ICD-10-CM | POA: Diagnosis not present

## 2012-11-26 DIAGNOSIS — Z23 Encounter for immunization: Secondary | ICD-10-CM | POA: Diagnosis not present

## 2012-11-26 DIAGNOSIS — E782 Mixed hyperlipidemia: Secondary | ICD-10-CM | POA: Diagnosis not present

## 2012-11-26 DIAGNOSIS — Z1331 Encounter for screening for depression: Secondary | ICD-10-CM | POA: Diagnosis not present

## 2012-11-26 DIAGNOSIS — M1990 Unspecified osteoarthritis, unspecified site: Secondary | ICD-10-CM | POA: Diagnosis not present

## 2012-11-26 DIAGNOSIS — R7989 Other specified abnormal findings of blood chemistry: Secondary | ICD-10-CM | POA: Diagnosis not present

## 2012-11-26 DIAGNOSIS — Z0000 Encounter for general adult medical examination without abnormal findings: Secondary | ICD-10-CM | POA: Diagnosis not present

## 2012-11-26 DIAGNOSIS — N400 Enlarged prostate without lower urinary tract symptoms: Secondary | ICD-10-CM | POA: Diagnosis not present

## 2012-12-20 DIAGNOSIS — T1510XA Foreign body in conjunctival sac, unspecified eye, initial encounter: Secondary | ICD-10-CM | POA: Diagnosis not present

## 2012-12-20 DIAGNOSIS — H409 Unspecified glaucoma: Secondary | ICD-10-CM | POA: Diagnosis not present

## 2012-12-20 DIAGNOSIS — H4011X0 Primary open-angle glaucoma, stage unspecified: Secondary | ICD-10-CM | POA: Diagnosis not present

## 2012-12-20 DIAGNOSIS — H01009 Unspecified blepharitis unspecified eye, unspecified eyelid: Secondary | ICD-10-CM | POA: Diagnosis not present

## 2013-03-10 DIAGNOSIS — J069 Acute upper respiratory infection, unspecified: Secondary | ICD-10-CM | POA: Diagnosis not present

## 2013-03-10 DIAGNOSIS — R350 Frequency of micturition: Secondary | ICD-10-CM | POA: Diagnosis not present

## 2013-06-05 DIAGNOSIS — T1510XA Foreign body in conjunctival sac, unspecified eye, initial encounter: Secondary | ICD-10-CM | POA: Diagnosis not present

## 2013-06-05 DIAGNOSIS — H01009 Unspecified blepharitis unspecified eye, unspecified eyelid: Secondary | ICD-10-CM | POA: Diagnosis not present

## 2013-06-05 DIAGNOSIS — H409 Unspecified glaucoma: Secondary | ICD-10-CM | POA: Diagnosis not present

## 2013-06-05 DIAGNOSIS — H4011X0 Primary open-angle glaucoma, stage unspecified: Secondary | ICD-10-CM | POA: Diagnosis not present

## 2013-07-16 DIAGNOSIS — C44611 Basal cell carcinoma of skin of unspecified upper limb, including shoulder: Secondary | ICD-10-CM | POA: Diagnosis not present

## 2013-07-16 DIAGNOSIS — C44621 Squamous cell carcinoma of skin of unspecified upper limb, including shoulder: Secondary | ICD-10-CM | POA: Diagnosis not present

## 2013-08-06 DIAGNOSIS — Z85828 Personal history of other malignant neoplasm of skin: Secondary | ICD-10-CM | POA: Diagnosis not present

## 2013-08-06 DIAGNOSIS — L570 Actinic keratosis: Secondary | ICD-10-CM | POA: Diagnosis not present

## 2013-09-05 DIAGNOSIS — T1510XA Foreign body in conjunctival sac, unspecified eye, initial encounter: Secondary | ICD-10-CM | POA: Diagnosis not present

## 2013-10-02 DIAGNOSIS — H409 Unspecified glaucoma: Secondary | ICD-10-CM | POA: Diagnosis not present

## 2013-10-02 DIAGNOSIS — H02059 Trichiasis without entropian unspecified eye, unspecified eyelid: Secondary | ICD-10-CM | POA: Diagnosis not present

## 2013-10-02 DIAGNOSIS — H01009 Unspecified blepharitis unspecified eye, unspecified eyelid: Secondary | ICD-10-CM | POA: Diagnosis not present

## 2013-10-02 DIAGNOSIS — H4011X0 Primary open-angle glaucoma, stage unspecified: Secondary | ICD-10-CM | POA: Diagnosis not present

## 2013-10-07 DIAGNOSIS — Z85828 Personal history of other malignant neoplasm of skin: Secondary | ICD-10-CM | POA: Diagnosis not present

## 2013-10-07 DIAGNOSIS — L821 Other seborrheic keratosis: Secondary | ICD-10-CM | POA: Diagnosis not present

## 2013-10-07 DIAGNOSIS — L570 Actinic keratosis: Secondary | ICD-10-CM | POA: Diagnosis not present

## 2013-11-27 DIAGNOSIS — R739 Hyperglycemia, unspecified: Secondary | ICD-10-CM | POA: Diagnosis not present

## 2013-11-27 DIAGNOSIS — Z1389 Encounter for screening for other disorder: Secondary | ICD-10-CM | POA: Diagnosis not present

## 2013-11-27 DIAGNOSIS — G4700 Insomnia, unspecified: Secondary | ICD-10-CM | POA: Diagnosis not present

## 2013-11-27 DIAGNOSIS — E780 Pure hypercholesterolemia: Secondary | ICD-10-CM | POA: Diagnosis not present

## 2013-11-27 DIAGNOSIS — Z23 Encounter for immunization: Secondary | ICD-10-CM | POA: Diagnosis not present

## 2013-11-27 DIAGNOSIS — R972 Elevated prostate specific antigen [PSA]: Secondary | ICD-10-CM | POA: Diagnosis not present

## 2013-11-27 DIAGNOSIS — K219 Gastro-esophageal reflux disease without esophagitis: Secondary | ICD-10-CM | POA: Diagnosis not present

## 2013-11-27 DIAGNOSIS — N400 Enlarged prostate without lower urinary tract symptoms: Secondary | ICD-10-CM | POA: Diagnosis not present

## 2013-11-27 DIAGNOSIS — Z0000 Encounter for general adult medical examination without abnormal findings: Secondary | ICD-10-CM | POA: Diagnosis not present

## 2013-12-01 DIAGNOSIS — T1512XA Foreign body in conjunctival sac, left eye, initial encounter: Secondary | ICD-10-CM | POA: Diagnosis not present

## 2013-12-09 DIAGNOSIS — R972 Elevated prostate specific antigen [PSA]: Secondary | ICD-10-CM | POA: Diagnosis not present

## 2013-12-12 DIAGNOSIS — Z1211 Encounter for screening for malignant neoplasm of colon: Secondary | ICD-10-CM | POA: Diagnosis not present

## 2013-12-17 DIAGNOSIS — Z1211 Encounter for screening for malignant neoplasm of colon: Secondary | ICD-10-CM | POA: Diagnosis not present

## 2013-12-31 DIAGNOSIS — H4011X2 Primary open-angle glaucoma, moderate stage: Secondary | ICD-10-CM | POA: Diagnosis not present

## 2013-12-31 DIAGNOSIS — H01002 Unspecified blepharitis right lower eyelid: Secondary | ICD-10-CM | POA: Diagnosis not present

## 2013-12-31 DIAGNOSIS — H01001 Unspecified blepharitis right upper eyelid: Secondary | ICD-10-CM | POA: Diagnosis not present

## 2013-12-31 DIAGNOSIS — H01004 Unspecified blepharitis left upper eyelid: Secondary | ICD-10-CM | POA: Diagnosis not present

## 2014-02-03 DIAGNOSIS — H04123 Dry eye syndrome of bilateral lacrimal glands: Secondary | ICD-10-CM | POA: Diagnosis not present

## 2014-02-03 DIAGNOSIS — Z961 Presence of intraocular lens: Secondary | ICD-10-CM | POA: Diagnosis not present

## 2014-02-10 DIAGNOSIS — Z961 Presence of intraocular lens: Secondary | ICD-10-CM | POA: Diagnosis not present

## 2014-02-10 DIAGNOSIS — H04123 Dry eye syndrome of bilateral lacrimal glands: Secondary | ICD-10-CM | POA: Diagnosis not present

## 2014-02-25 DIAGNOSIS — K136 Irritative hyperplasia of oral mucosa: Secondary | ICD-10-CM | POA: Diagnosis not present

## 2014-03-03 DIAGNOSIS — H04123 Dry eye syndrome of bilateral lacrimal glands: Secondary | ICD-10-CM | POA: Diagnosis not present

## 2014-04-06 DIAGNOSIS — H31412 Hemorrhagic choroidal detachment, left eye: Secondary | ICD-10-CM | POA: Diagnosis not present

## 2014-04-07 DIAGNOSIS — H31412 Hemorrhagic choroidal detachment, left eye: Secondary | ICD-10-CM | POA: Diagnosis not present

## 2014-04-07 DIAGNOSIS — H43811 Vitreous degeneration, right eye: Secondary | ICD-10-CM | POA: Diagnosis not present

## 2014-04-14 ENCOUNTER — Ambulatory Visit: Payer: Self-pay | Admitting: Ophthalmology

## 2014-04-14 DIAGNOSIS — H269 Unspecified cataract: Secondary | ICD-10-CM | POA: Diagnosis not present

## 2014-04-14 DIAGNOSIS — Z01810 Encounter for preprocedural cardiovascular examination: Secondary | ICD-10-CM | POA: Diagnosis not present

## 2014-04-15 ENCOUNTER — Ambulatory Visit: Payer: Self-pay | Admitting: Ophthalmology

## 2014-04-15 DIAGNOSIS — H578 Other specified disorders of eye and adnexa: Secondary | ICD-10-CM | POA: Diagnosis not present

## 2014-04-15 DIAGNOSIS — H31412 Hemorrhagic choroidal detachment, left eye: Secondary | ICD-10-CM | POA: Diagnosis not present

## 2014-04-15 DIAGNOSIS — H3322 Serous retinal detachment, left eye: Secondary | ICD-10-CM | POA: Diagnosis not present

## 2014-04-15 DIAGNOSIS — H33012 Retinal detachment with single break, left eye: Secondary | ICD-10-CM | POA: Diagnosis not present

## 2014-04-15 DIAGNOSIS — Z882 Allergy status to sulfonamides status: Secondary | ICD-10-CM | POA: Diagnosis not present

## 2014-04-15 DIAGNOSIS — H409 Unspecified glaucoma: Secondary | ICD-10-CM | POA: Diagnosis not present

## 2014-04-15 DIAGNOSIS — Z881 Allergy status to other antibiotic agents status: Secondary | ICD-10-CM | POA: Diagnosis not present

## 2014-04-15 DIAGNOSIS — Z87442 Personal history of urinary calculi: Secondary | ICD-10-CM | POA: Diagnosis not present

## 2014-05-26 DIAGNOSIS — H31412 Hemorrhagic choroidal detachment, left eye: Secondary | ICD-10-CM | POA: Diagnosis not present

## 2014-06-14 NOTE — Op Note (Signed)
PATIENT NAME:  [name], [name] MR#:  [id number] DATE OF BIRTH:  [birth year]  DATE OF PROCEDURE:  04/15/2014   PREPROCEDURE DIAGNOSIS: Choroidal hemorrhage, left eye.   POSTPROCEDURE DIAGNOSIS:  Choroidal hemorrhage, left eye and retinal detachment, left eye.   PROCEDURE: A 25-gauge pars plana vitrectomy with drainage of hemorrhagic choroidal detachment, drain retinotomy air fluid exchange, endolaser and 18% C3F8 gas.   ANESTHESIA: MAC with retrobulbar block.   COMPLICATIONS: None.   ESTIMATED BLOOD LOSS: Minimal.   SPECIMENS:  None.   PROCEDURE NOTE: The patient presented to clinic after having severe eye pain and loss of vision with heavy lifting.  Of note, the patient was taking doses of saw palmetto which is known to be an herbal anticoagulant.  When he presented to clinic, he had severe choroidal hemorrhage in the left eye with the choroidal extending up to just posterior to the intraocular lens.  The patient was observed for a week. There was minimal improvement of the choroidal at day 10, so the decision was made to proceed with pars plana vitrectomy with coronal drainage.  Of note, the patient did have a scleral buckle previously for a retinal detachment.  Risks, benefits, alternatives, and complications were discussed with the patient, and he elected to proceed with pars plana vitrectomy with drainage of choroidal hemorrhages and any other indicated procedure.   On the day of surgery, the patient was greeted in the preoperative holding area. The left eye was marked and any questions were answered. The patient was brought into the Operating Room in supine position.  Monitored anesthesia care was administered and 5 mL of retrobulbar block consisting of lidocaine plain, Marcaine plain and Wydase was injected.  The left eye was then prepped and draped in usual sterile fashion.  A conjunctival peritomy was performed for 270 degrees.  There was a degree of fibrosis conjunctiva superotemporally from  his previous scleral buckle which was left intact. The infusion trocar was placed inferiorly as to avoid the area of the choroidal hemorrhage if possible. The lumen of the infusion was viewed to be in the vitreous cavity before starting the infusion. This was checked very carefully using the light pipe.  Attention was drawn then to the sclera where cautery was used for hemostasis and a trocar blade was used to incise the sclera about 5 mm posterior to the limbus. This incision was extended to about 2 to 3 mm and then using manipulation with a 0.12 choroidal hemorrhage was drained in the temporal location first.  This created significant resolution of the choroidal hemorrhage in that area, although there was still a large nasal choroidal hemorrhage and so the decision was made to also make an incision in the nasal sclera.  This choroidal was also drained.  At this point the choroidals were much smaller and access to the vitreous cavity was performed using the other 2 trocars.  A light pipe and vitrector were inserted into the trocars and a core vitrectomy was performed. The patient did already have a PVD.  As noted, he did have a scleral buckle on the eye and so a vitrectomy was performed for 360 degrees out to the periphery removing all of the vitreous.  It was noted during this procedure that the patient did have a retinal detachment.  It appeared that his prior hole, which had been treated with a scleral buckle, had reopened and so the subretinal fluid was drained partially with a fluid exchange at this point.  It was also  noted that there was still significant nasal suprachoroidal detachment and so a second sclerotomy, superior to the initial drainage site, was created to drain more of the suprachoroidal hemorrhage.   Retinal detachment was then addressed.  Due to the very anterior nature of the original hole, and the fact that that original hole was laying on the buckle, decision was made to make a drainage  retinotomy more posteriorly in the meridian of the break.  Subretinal fluid was then drained using the soft tip cannula and air was exchanged.  Once the retina was attached, extensive laser was performed for 360 degrees on the area of the attached retina, the retinal hole was lasered and the drain retinotomy was also lasered for 360 degrees around.  Scleral depression when the eyelid under fluid was performed and there were no additional retinal tears or breaks to address.  At this point, the gas was drawn up 18% C3F8 and 4 times the vitreous volume of gas was exchanged.  A single stitch was drawn through each of the sclerotomies to close them to ensure appropriate healing given the retinal detachment.  The third sclerotomy was not closed.  It was left open to drain further hemorrhage as needed. The trocar sites were then sutured shut and the pressure in the eye was acceptable by palpation.  The conjunctiva was then carefully closed. Overall the buckle element and elements of the sclerotomies and other wounds and the other incisions.  Subconjunctival cefuroxime and dexamethasone was injected. The patient was then patched and shielded with Neo-Poly-Dex ointment and taken to the recovery area in stable condition.  He has follow-up tomorrow in the [place] office.     ____________________________ Laban Emperor. Oval Linsey, MD jdr:DT D: 04/15/2014 20:45:22 ET T: 04/16/2014 08:42:34 ET JOB#: [id number]  cc: Janett Billow D. Oval Linsey, MD, <Dictator> Alexia Freestone MD ELECTRONICALLY SIGNED 05/06/2014 7:51

## 2014-07-06 DIAGNOSIS — H4440 Unspecified hypotony of eye: Secondary | ICD-10-CM | POA: Diagnosis not present

## 2014-07-07 DIAGNOSIS — H5989 Other postprocedural complications and disorders of eye and adnexa, not elsewhere classified: Secondary | ICD-10-CM | POA: Diagnosis not present

## 2014-07-09 DIAGNOSIS — R634 Abnormal weight loss: Secondary | ICD-10-CM | POA: Diagnosis not present

## 2014-07-09 DIAGNOSIS — R972 Elevated prostate specific antigen [PSA]: Secondary | ICD-10-CM | POA: Diagnosis not present

## 2014-08-07 DIAGNOSIS — D225 Melanocytic nevi of trunk: Secondary | ICD-10-CM | POA: Diagnosis not present

## 2014-08-07 DIAGNOSIS — L570 Actinic keratosis: Secondary | ICD-10-CM | POA: Diagnosis not present

## 2014-08-07 DIAGNOSIS — B351 Tinea unguium: Secondary | ICD-10-CM | POA: Diagnosis not present

## 2014-08-07 DIAGNOSIS — X32XXXD Exposure to sunlight, subsequent encounter: Secondary | ICD-10-CM | POA: Diagnosis not present

## 2014-08-13 DIAGNOSIS — H5989 Other postprocedural complications and disorders of eye and adnexa, not elsewhere classified: Secondary | ICD-10-CM | POA: Diagnosis not present

## 2014-08-27 DIAGNOSIS — L304 Erythema intertrigo: Secondary | ICD-10-CM | POA: Diagnosis not present

## 2014-09-24 DIAGNOSIS — H33012 Retinal detachment with single break, left eye: Secondary | ICD-10-CM | POA: Diagnosis not present

## 2014-09-24 DIAGNOSIS — H5989 Other postprocedural complications and disorders of eye and adnexa, not elsewhere classified: Secondary | ICD-10-CM | POA: Diagnosis not present

## 2014-09-24 DIAGNOSIS — H31402 Unspecified choroidal detachment, left eye: Secondary | ICD-10-CM | POA: Diagnosis not present

## 2014-10-02 DIAGNOSIS — W5501XA Bitten by cat, initial encounter: Secondary | ICD-10-CM | POA: Diagnosis not present

## 2014-10-02 DIAGNOSIS — S60811A Abrasion of right wrist, initial encounter: Secondary | ICD-10-CM | POA: Diagnosis not present

## 2014-11-19 DIAGNOSIS — H31412 Hemorrhagic choroidal detachment, left eye: Secondary | ICD-10-CM | POA: Diagnosis not present

## 2014-11-19 DIAGNOSIS — H5989 Other postprocedural complications and disorders of eye and adnexa, not elsewhere classified: Secondary | ICD-10-CM | POA: Diagnosis not present

## 2014-11-26 DIAGNOSIS — L03031 Cellulitis of right toe: Secondary | ICD-10-CM | POA: Diagnosis not present

## 2014-11-26 DIAGNOSIS — B351 Tinea unguium: Secondary | ICD-10-CM | POA: Diagnosis not present

## 2014-12-15 DIAGNOSIS — D649 Anemia, unspecified: Secondary | ICD-10-CM | POA: Diagnosis not present

## 2014-12-15 DIAGNOSIS — R972 Elevated prostate specific antigen [PSA]: Secondary | ICD-10-CM | POA: Diagnosis not present

## 2014-12-15 DIAGNOSIS — N402 Nodular prostate without lower urinary tract symptoms: Secondary | ICD-10-CM | POA: Diagnosis not present

## 2014-12-15 DIAGNOSIS — J309 Allergic rhinitis, unspecified: Secondary | ICD-10-CM | POA: Diagnosis not present

## 2014-12-15 DIAGNOSIS — R7309 Other abnormal glucose: Secondary | ICD-10-CM | POA: Diagnosis not present

## 2014-12-15 DIAGNOSIS — Z0000 Encounter for general adult medical examination without abnormal findings: Secondary | ICD-10-CM | POA: Diagnosis not present

## 2014-12-15 DIAGNOSIS — G4700 Insomnia, unspecified: Secondary | ICD-10-CM | POA: Diagnosis not present

## 2014-12-15 DIAGNOSIS — E559 Vitamin D deficiency, unspecified: Secondary | ICD-10-CM | POA: Diagnosis not present

## 2014-12-15 DIAGNOSIS — Z1389 Encounter for screening for other disorder: Secondary | ICD-10-CM | POA: Diagnosis not present

## 2014-12-15 DIAGNOSIS — E782 Mixed hyperlipidemia: Secondary | ICD-10-CM | POA: Diagnosis not present

## 2015-02-16 DIAGNOSIS — H5989 Other postprocedural complications and disorders of eye and adnexa, not elsewhere classified: Secondary | ICD-10-CM | POA: Diagnosis not present

## 2015-02-16 DIAGNOSIS — H31412 Hemorrhagic choroidal detachment, left eye: Secondary | ICD-10-CM | POA: Diagnosis not present

## 2015-03-09 DIAGNOSIS — H5989 Other postprocedural complications and disorders of eye and adnexa, not elsewhere classified: Secondary | ICD-10-CM | POA: Diagnosis not present

## 2015-03-09 DIAGNOSIS — H16212 Exposure keratoconjunctivitis, left eye: Secondary | ICD-10-CM | POA: Diagnosis not present

## 2015-03-09 DIAGNOSIS — T85398A Other mechanical complication of other ocular prosthetic devices, implants and grafts, initial encounter: Secondary | ICD-10-CM | POA: Diagnosis not present

## 2015-03-19 DIAGNOSIS — D225 Melanocytic nevi of trunk: Secondary | ICD-10-CM | POA: Diagnosis not present

## 2015-03-19 DIAGNOSIS — L570 Actinic keratosis: Secondary | ICD-10-CM | POA: Diagnosis not present

## 2015-03-19 DIAGNOSIS — X32XXXD Exposure to sunlight, subsequent encounter: Secondary | ICD-10-CM | POA: Diagnosis not present

## 2015-03-19 DIAGNOSIS — B351 Tinea unguium: Secondary | ICD-10-CM | POA: Diagnosis not present

## 2015-04-15 DIAGNOSIS — H31412 Hemorrhagic choroidal detachment, left eye: Secondary | ICD-10-CM | POA: Diagnosis not present

## 2015-06-16 DIAGNOSIS — J309 Allergic rhinitis, unspecified: Secondary | ICD-10-CM | POA: Diagnosis not present

## 2015-06-16 DIAGNOSIS — M419 Scoliosis, unspecified: Secondary | ICD-10-CM | POA: Diagnosis not present

## 2015-06-16 DIAGNOSIS — G4700 Insomnia, unspecified: Secondary | ICD-10-CM | POA: Diagnosis not present

## 2015-06-16 DIAGNOSIS — N400 Enlarged prostate without lower urinary tract symptoms: Secondary | ICD-10-CM | POA: Diagnosis not present

## 2015-06-16 DIAGNOSIS — R7303 Prediabetes: Secondary | ICD-10-CM | POA: Diagnosis not present

## 2015-06-16 DIAGNOSIS — M5136 Other intervertebral disc degeneration, lumbar region: Secondary | ICD-10-CM | POA: Diagnosis not present

## 2015-06-24 DIAGNOSIS — H18422 Band keratopathy, left eye: Secondary | ICD-10-CM | POA: Diagnosis not present

## 2015-06-24 DIAGNOSIS — H5201 Hypermetropia, right eye: Secondary | ICD-10-CM | POA: Diagnosis not present

## 2015-06-24 DIAGNOSIS — H401122 Primary open-angle glaucoma, left eye, moderate stage: Secondary | ICD-10-CM | POA: Diagnosis not present

## 2015-06-24 DIAGNOSIS — H401112 Primary open-angle glaucoma, right eye, moderate stage: Secondary | ICD-10-CM | POA: Diagnosis not present

## 2015-08-03 ENCOUNTER — Other Ambulatory Visit: Payer: Self-pay | Admitting: Orthopaedic Surgery

## 2015-08-03 ENCOUNTER — Ambulatory Visit
Admission: RE | Admit: 2015-08-03 | Discharge: 2015-08-03 | Disposition: A | Discharge status: Home / Self Care | Payer: Medicare Other | Source: Ambulatory Visit | Attending: Orthopaedic Surgery | Admitting: Orthopaedic Surgery

## 2015-08-03 DIAGNOSIS — M4850XS Collapsed vertebra, not elsewhere classified, site unspecified, sequela of fracture: Secondary | ICD-10-CM

## 2015-08-03 DIAGNOSIS — M4806 Spinal stenosis, lumbar region: Secondary | ICD-10-CM | POA: Diagnosis not present

## 2015-08-03 DIAGNOSIS — M545 Low back pain: Secondary | ICD-10-CM | POA: Diagnosis not present

## 2015-08-03 DIAGNOSIS — M25551 Pain in right hip: Secondary | ICD-10-CM | POA: Diagnosis not present

## 2015-08-06 ENCOUNTER — Other Ambulatory Visit (HOSPITAL_COMMUNITY): Payer: Self-pay | Admitting: Interventional Radiology

## 2015-08-06 DIAGNOSIS — IMO0002 Reserved for concepts with insufficient information to code with codable children: Secondary | ICD-10-CM

## 2015-08-06 DIAGNOSIS — M546 Pain in thoracic spine: Secondary | ICD-10-CM

## 2015-08-07 ENCOUNTER — Emergency Department (HOSPITAL_COMMUNITY)
Admission: EM | Admit: 2015-08-07 | Discharge: 2015-08-07 | Disposition: A | Discharge status: Home / Self Care | Payer: Medicare Other | Attending: Emergency Medicine | Admitting: Emergency Medicine

## 2015-08-07 ENCOUNTER — Encounter (HOSPITAL_COMMUNITY): Payer: Self-pay | Admitting: *Deleted

## 2015-08-07 DIAGNOSIS — M545 Low back pain, unspecified: Secondary | ICD-10-CM

## 2015-08-07 HISTORY — DX: Benign prostatic hyperplasia without lower urinary tract symptoms: N40.0

## 2015-08-07 MED ORDER — HYDROCODONE-ACETAMINOPHEN 5-325 MG PO TABS: 1.0000 | ORAL_TABLET | Freq: Four times a day (QID) | ORAL | Status: DC | PRN

## 2015-08-07 MED ORDER — BUPIVACAINE HCL 0.5 % IJ SOLN
10.0000 mL | Freq: Once | INTRAMUSCULAR | Status: DC
  Filled 2015-08-07: qty 10

## 2015-08-07 NOTE — Discharge Instructions (Signed)

## 2015-08-07 NOTE — ED Notes (Signed)
MD at bedside for injection, pt now ambulating to BR with can, no assistance needed

## 2015-08-07 NOTE — ED Notes (Signed)
Pt is HOH, pt c/o back pain, worse beginning Monday, pt seen this week and had MRI, pt and wife aware of T12 compression fx. Pt does have appt for Monday afternoon with Dr. Blinda Leatherwood(?), pt states his pain is 10/10 with movement, unrelieved by Aleve and Dones.

## 2015-08-07 NOTE — ED Notes (Signed)
Pt w/ hx of lumbar compression fractures complains of pain to lower back. Pt wife states the pt was unable to get out of bed due to pain. Pt states he has not been prescribed any pain medication by the doctors he has seen for his back pain. Pt took aleve for pain, which he states has helped in the past.

## 2015-08-07 NOTE — ED Notes (Signed)
Pt ambulatory with cane to [place], by pts choice. Pt and wife given directions to [facility].

## 2015-08-07 NOTE — ED Provider Notes (Signed)
CSN: [id number]     Arrival date & time 08/07/15  1740 History   First MD Initiated Contact with Patient 08/07/15 1941     Chief Complaint  Patient presents with  . Back Pain     (Consider location/radiation/quality/duration/timing/severity/associated sxs/prior Treatment) Patient is a 82 y.o. male presenting with back pain. The history is provided by the patient.  Back Pain Location:  Lumbar spine and gluteal region Quality:  Aching Radiates to:  Does not radiate Pain severity:  Moderate Pain is:  Worse during the night Onset quality:  Gradual Duration:  6 days Timing:  Constant Progression:  Unchanged Chronicity:  New Relieved by:  Nothing Worsened by:  Nothing tried Ineffective treatments:  None tried Associated symptoms: no fever     Past Medical History  Diagnosis Date  . Enlarged prostate    History reviewed. No pertinent past surgical history. No family history on file. Social History  Substance Use Topics  . Smoking status: Never Smoker   . Smokeless tobacco: None  . Alcohol Use: No    Review of Systems  Constitutional: Negative for fever.  Musculoskeletal: Positive for back pain.  All other systems reviewed and are negative.     Allergies  Review of patient's allergies indicates not on file.  Home Medications   Prior to Admission medications   Not on File   BP 129/86 mmHg  Pulse 91  Temp(Src) 98.8 F (37.1 C) (Oral)  Resp 16  SpO2 97% Physical Exam  Constitutional: He is oriented to person, place, and time. He appears well-developed and well-nourished. No distress.  HENT:  Head: Normocephalic and atraumatic.  Eyes: Conjunctivae are normal.  Neck: Neck supple. No tracheal deviation present.  Cardiovascular: Normal rate and regular rhythm.   Pulmonary/Chest: Effort normal. No respiratory distress.  Abdominal: Soft. He exhibits no distension.  Musculoskeletal:       Lumbar back: He exhibits tenderness and spasm. He exhibits normal range of  motion.       Back:  Neurological: He is alert and oriented to person, place, and time.  Skin: Skin is warm and dry.  Psychiatric: He has a normal mood and affect.  Vitals reviewed.   ED Course  Procedures (including critical care time)  Procedure Note: Trigger Point Injection for Myofascial pain  Performed by Dr. Laneta Simmers Indication: muscle/myofascial pain Muscle body and tendon sheath of the left superior gluteal muscle(s) were injected with 0.5% bupivacaine under sterile technique for release of muscle spasm/pain. Patient tolerated well with immediate improvement of symptoms and no immediate complications following procedure.  CPT Code:   1 or 2 muscle bodies: 20552   Labs Review Labs Reviewed - No data to display  Imaging Review No results found. I have personally reviewed and evaluated these images and lab results as part of my medical decision-making.   EKG Interpretation None      MDM   Final diagnoses:  Left-sided low back pain without sciatica    82 y.o. male presents with left low back pain after picking up a box with a cat in it and straining. Hurts with certain positions and lying down. Had MR of back showing compression fracture. Here, tenderness is far left of midline and is focally tender over a superior gluteal muscle body. Trigger point injection was applied and patient was able to ambulate pain free. Was provided a few norco pills for breakthrough as he is pending spine evaluation early this week.     Leo Grosser, MD 08/08/15  1318 

## 2015-08-07 NOTE — ED Notes (Signed)
Bed: [id number] Expected date:  Expected time:  Means of arrival:  Comments: Pt. Still in rm.

## 2015-08-09 ENCOUNTER — Ambulatory Visit (HOSPITAL_COMMUNITY)
Admission: RE | Admit: 2015-08-09 | Discharge: 2015-08-09 | Disposition: A | Discharge status: Home / Self Care | Payer: Medicare Other | Source: Ambulatory Visit | Attending: Interventional Radiology | Admitting: Interventional Radiology

## 2015-08-09 ENCOUNTER — Other Ambulatory Visit (HOSPITAL_COMMUNITY): Payer: Self-pay | Admitting: Interventional Radiology

## 2015-08-09 DIAGNOSIS — IMO0002 Reserved for concepts with insufficient information to code with codable children: Secondary | ICD-10-CM

## 2015-08-09 DIAGNOSIS — M545 Low back pain: Secondary | ICD-10-CM | POA: Diagnosis not present

## 2015-08-09 DIAGNOSIS — M546 Pain in thoracic spine: Secondary | ICD-10-CM

## 2015-08-09 DIAGNOSIS — T148 Other injury of unspecified body region: Secondary | ICD-10-CM | POA: Diagnosis not present

## 2015-08-10 ENCOUNTER — Encounter (HOSPITAL_COMMUNITY): Payer: Self-pay | Admitting: *Deleted

## 2015-08-10 ENCOUNTER — Emergency Department (HOSPITAL_COMMUNITY)
Admission: EM | Admit: 2015-08-10 | Discharge: 2015-08-10 | Disposition: A | Discharge status: Home / Self Care | Payer: Medicare Other | Attending: Emergency Medicine | Admitting: Emergency Medicine

## 2015-08-10 DIAGNOSIS — M545 Low back pain: Secondary | ICD-10-CM | POA: Diagnosis not present

## 2015-08-10 DIAGNOSIS — Z79899 Other long term (current) drug therapy: Secondary | ICD-10-CM | POA: Diagnosis not present

## 2015-08-10 MED ORDER — HYDROCODONE-ACETAMINOPHEN 5-325 MG PO TABS: 1.0000 | ORAL_TABLET | Freq: Four times a day (QID) | ORAL | Status: DC | PRN

## 2015-08-10 MED ORDER — HYDROCODONE-ACETAMINOPHEN 5-325 MG PO TABS
1.0000 | ORAL_TABLET | Freq: Once | ORAL | Status: AC
  Administered 2015-08-10: 1 via ORAL
  Filled 2015-08-10: qty 1

## 2015-08-10 NOTE — ED Notes (Signed)
Pt states that he was diagnosed with a compression fx to his back; pt states that he saw with Orthopedic MD yesterday; pt states that he is to have surgery to repair the fx on Fri morning; pt states that he is out of pain medications and the Ortho MD did not prescribe any additional meds; pt states that he was unable to sleep or get any rest last night due to the pain

## 2015-08-10 NOTE — ED Provider Notes (Signed)
CSN: [id number]     Arrival date & time 08/10/15  0608 History   First MD Initiated Contact with Patient 08/10/15 [phone]     Chief Complaint  Patient presents with  . Back Pain     The history is provided by the patient. No language interpreter was used.   [name] is a 82 y.o. male who presents to the Emergency Department complaining of back pain.  A week and a day ago he picked up his cat and developed acute onset back pain. Pain is in his mid back little bit to the left side. He saw his orthopedic surgeon who obtained plain films and MRI. He was then referred to Dr. Estanislado Pandy for intervention. He is been seen by Dr. Estanislado Pandy and he has surgery scheduled for Friday for his back pain. He was seen in the emergency department 3 days ago and was treated with a trigger point injection and Norco. His pain had improved at that time but yesterday when he was seen in the radiology office he was started on naproxen twice daily. He states the naproxen is not treating his pain. He has persistent pain in the left mid back. He denies any abdominal pain, nausea, numbness, weakness. Pain is worse when going from sitting to standing. As soon as he is standing his pain is ok. He does endorse constipation for the last several days. He is still able to pass gas.  Past Medical History  Diagnosis Date  . Enlarged prostate    History reviewed. No pertinent past surgical history. No family history on file. Social History  Substance Use Topics  . Smoking status: Never Smoker   . Smokeless tobacco: None  . Alcohol Use: No    Review of Systems  All other systems reviewed and are negative.     Allergies  Ciprofloxacin and Sulfa antibiotics  Home Medications   Prior to Admission medications   Medication Sig Start Date End Date Taking? Authorizing Provider  alfuzosin (UROXATRAL) 10 MG 24 hr tablet Take 10 mg by mouth daily after supper.   Yes Historical Provider, MD  Eszopiclone 3 MG TABS Take 3 mg  by mouth at bedtime. 06/20/15  Yes Historical Provider, MD  hypromellose (SYSTANE OVERNIGHT THERAPY) 0.3 % GEL ophthalmic ointment Place 1 application into the left eye at bedtime.   Yes Historical Provider, MD  Multiple Vitamin (MULTIVITAMIN WITH MINERALS) TABS tablet Take 1 tablet by mouth daily.   Yes Historical Provider, MD  naproxen sodium (ALEVE) 220 MG tablet Take 220 mg by mouth every 8 (eight) hours as needed (For pain.).   Yes Historical Provider, MD  TRAVATAN Z 0.004 % SOLN ophthalmic solution Place 1 drop into both eyes at bedtime. 05/05/15  Yes Historical Provider, MD  HYDROcodone-acetaminophen (NORCO/VICODIN) 5-325 MG tablet Take 1 tablet by mouth every 6 (six) hours as needed for severe pain. 08/10/15   Quintella Reichert, MD   BP 127/85 mmHg  Pulse 84  Temp(Src) 97.6 F (36.4 C) (Oral)  Resp 20  Ht 5\' 10"  (1.778 m)  Wt 139 lb (63.05 kg)  BMI 19.94 kg/m2  SpO2 96% Physical Exam  Constitutional: He is oriented to person, place, and time. He appears well-developed and well-nourished.  HENT:  Head: Normocephalic and atraumatic.  Cardiovascular: Normal rate and regular rhythm.   No murmur heard. Pulmonary/Chest: Effort normal and breath sounds normal. No respiratory distress.  Abdominal: Soft. There is no tenderness. There is no rebound and no guarding.  Musculoskeletal: He  exhibits no edema or tenderness.  Negative straight leg raise. Pain is reproducible with sitting up in bed. There is no palpable tenderness on examination of the back.  Neurological: He is alert and oriented to person, place, and time.  5 out of 5 strength in all 4 extremities, sensation to light touch intact in all 4 extremities  Skin: Skin is warm and dry.  Psychiatric: He has a normal mood and affect. His behavior is normal.  Nursing note and vitals reviewed.   ED Course  Procedures (including critical care time) Labs Review Labs Reviewed - No data to display  Imaging Review No results found. I have  personally reviewed and evaluated these images and lab results as part of my medical decision-making.   EKG Interpretation None      MDM   Final diagnoses:  Low back pain without sciatica, unspecified back pain laterality   Reviewed records. Patient has an acute T12 compression fracture on recent MRI.    Patient here for back pain following recent diagnosis of T12 compression fracture. He is neurovascularly intact on examination. His pain was better with Norco but he ran out of the medication. He also has constipation related to the pain meds. Discussed home care for constipation and providing pain meds to cover him until his intervention can be performed on Friday. Home care and return precautions were discussed.    Quintella Reichert, MD 08/10/15 [phone]

## 2015-08-10 NOTE — Discharge Instructions (Signed)
Take miralax, twice daily for constipation until symptoms improve.  You can also take Senokot according to label instructions.  If you have persistent constipation you can use a Fleet's enema.   Vertebral Fracture A vertebral fracture is a break in one of the bones that make up the spine (vertebrae). The vertebrae are stacked on top of each other to form the spinal column. They support the body and protect the spinal cord. The vertebral column has an upper part (cervical spine), a middle part (thoracic spine), and a lower part (lumbar spine). Most vertebral fractures occur in the thoracic spine or lumbar spine. There are three main types of vertebral fractures:  Flexion fracture. This happens when vertebrae collapse. Vertebrae can collapse:  In the front (compression fracture). This type of fracture is common in people who have a condition that causes their bones to be weak and brittle (osteoporosis). The fracture can make a person lose height.  In the front and back (axial burst fracture).  Extension fracture. This happens when an external force pulls apart the vertebrae.  Rotation fracture. This happens when the spine bends extremely in one direction. This type can cause a piece of a vertebra to break off (transverse process fracture) or move out of its normal position (fracture dislocation). This type of fracture has a high risk for spinal cord injury. Vertebral fractures can range from mild to very severe. The most severe types are those that cause the broken bones to move out of place (unstable) and those that injure or press on the spinal cord. CAUSES This condition is usually caused by a forceful injury. This type of injury commonly results from:  Car accidents.  Falling or jumping from a great height.  Collisions in contact sports.  Violent acts, such as an assault or a gunshot wound. RISK FACTORS This injury is more likely to happen to people who:  Have  osteoporosis.  Participate in contact sports.  Are in situations that could result in falls or other violent injuries. SYMPTOMS Symptoms of this injury depend on the location and the type of fracture. The most common symptom is back pain that gets worse with movement. You may also have trouble standing or walking. If a fracture has damaged your spinal cord or is pressing on it, you may also have:  Numbness.  Tingling.  Weakness.  Loss of movement.  Loss of bowel or bladder control. DIAGNOSIS This injury may be diagnosed based on symptoms, medical history, and a physical exam. You may also have imaging tests to confirm the diagnosis. These may include:  Spine X-ray.  CT scan.  MRI. TREATMENT Treatment for this injury depends on the type of fracture. If your fracture is stable and does not affect your spinal cord, it may heal with nonsurgical treatment, such as:  Taking pain medicine.  Wearing a cast or a brace.  Doing physical therapy exercises. If your vertebral fracture is unstable or it affects your spinal cord, you may need surgical treatment, such as:  Laminectomy. This procedure involves removing the part of a vertebra that is pushing on the spinal cord (spinal decompression surgery). Bone fragments may also be removed.  Spinal fusion. This procedure is used to stabilize an unstable fracture. Vertebrae may be joined together with a piece of bone from another part of your body (graft) and held in place with rods, plates, or screws.  Vertebroplasty. In this procedure, bone cement is used to rebuild collapsed vertebrae. HOME CARE INSTRUCTIONS General Instructions  Take medicines only as directed by your health care provider.  Do not drive or operate heavy machinery while taking pain medicine.  If directed, apply ice to the injured area:  Put ice in a plastic bag.  Place a towel between your skin and the bag.  Leave the ice on for 30 minutes every two hours at  first. Then apply the ice as needed.  Wear your neck brace or back brace as directed by your health care provider.  Do not drink alcohol. Alcohol can interfere with your treatment.  Keep all follow-up visits as directed by your health care provider. This is important. It can help to prevent permanent injury, disability, and long-lasting (chronic) pain. Activity  Stay in bed (on bed rest) only as directed by your health care provider. Being on bed rest for too long can make your condition worse.  Return to your normal activities as directed by your health care provider. Ask what activities are safe for you.  Do exercises to improve motion and strength in your back (physical therapy), as recommended by your health care provider.   Exercise regularly as directed by your health care provider. SEEK MEDICAL CARE IF:  You have a fever.  You develop a cough that makes your pain worse.  Your pain medicine is not helping.  Your pain does not get better over time.  You cannot return to your normal activities as planned or expected. SEEK IMMEDIATE MEDICAL CARE IF:  Your pain is very bad and it suddenly gets worse.  You are unable to move any body part (paralysis) that is below the level of your injury.  You have numbness, tingling, or weakness in any body part that is below the level of your injury.  You cannot control your bladder or bowels.   This information is not intended to replace advice given to you by your health care provider. Make sure you discuss any questions you have with your health care provider.   Document Released: 03/09/2004 Document Revised: 06/16/2014 Document Reviewed: 02/04/2014 Elsevier Interactive Patient Education 2016 [employer].  Constipation, Adult Constipation is when a person has fewer than three bowel movements a week, has difficulty having a bowel movement, or has stools that are dry, hard, or larger than normal. As people grow older, constipation  is more common. A low-fiber diet, not taking in enough fluids, and taking certain medicines may make constipation worse.  CAUSES   Certain medicines, such as antidepressants, pain medicine, iron supplements, antacids, and water pills.   Certain diseases, such as diabetes, irritable bowel syndrome (IBS), thyroid disease, or depression.   Not drinking enough water.   Not eating enough fiber-rich foods.   Stress or travel.   Lack of physical activity or exercise.   Ignoring the urge to have a bowel movement.   Using laxatives too much.  SIGNS AND SYMPTOMS   Having fewer than three bowel movements a week.   Straining to have a bowel movement.   Having stools that are hard, dry, or larger than normal.   Feeling full or bloated.   Pain in the lower abdomen.   Not feeling relief after having a bowel movement.  DIAGNOSIS  Your health care provider will take a medical history and perform a physical exam. Further testing may be done for severe constipation. Some tests may include:  A barium enema X-ray to examine your rectum, colon, and, sometimes, your small intestine.   A sigmoidoscopy to examine your lower colon.  A colonoscopy to examine your entire colon. TREATMENT  Treatment will depend on the severity of your constipation and what is causing it. Some dietary treatments include drinking more fluids and eating more fiber-rich foods. Lifestyle treatments may include regular exercise. If these diet and lifestyle recommendations do not help, your health care provider may recommend taking over-the-counter laxative medicines to help you have bowel movements. Prescription medicines may be prescribed if over-the-counter medicines do not work.  HOME CARE INSTRUCTIONS   Eat foods that have a lot of fiber, such as fruits, vegetables, whole grains, and beans.  Limit foods high in fat and processed sugars, such as french fries, hamburgers, cookies, candies, and soda.    A fiber supplement may be added to your diet if you cannot get enough fiber from foods.   Drink enough fluids to keep your urine clear or pale yellow.   Exercise regularly or as directed by your health care provider.   Go to the restroom when you have the urge to go. Do not hold it.   Only take over-the-counter or prescription medicines as directed by your health care provider. Do not take other medicines for constipation without talking to your health care provider first.  English IF:   You have bright red blood in your stool.   Your constipation lasts for more than 4 days or gets worse.   You have abdominal or rectal pain.   You have thin, pencil-like stools.   You have unexplained weight loss. MAKE SURE YOU:   Understand these instructions.  Will watch your condition.  Will get help right away if you are not doing well or get worse.   This information is not intended to replace advice given to you by your health care provider. Make sure you discuss any questions you have with your health care provider.   Document Released: 10/29/2003 Document Revised: 02/20/2014 Document Reviewed: 11/11/2012 Elsevier Interactive Patient Education [employer].

## 2015-08-12 ENCOUNTER — Other Ambulatory Visit: Payer: Self-pay | Admitting: Internal Medicine

## 2015-08-12 ENCOUNTER — Other Ambulatory Visit: Payer: Self-pay | Admitting: Radiology

## 2015-08-12 DIAGNOSIS — R109 Unspecified abdominal pain: Secondary | ICD-10-CM | POA: Diagnosis not present

## 2015-08-12 DIAGNOSIS — S22000A Wedge compression fracture of unspecified thoracic vertebra, initial encounter for closed fracture: Secondary | ICD-10-CM | POA: Diagnosis not present

## 2015-08-12 DIAGNOSIS — K838 Other specified diseases of biliary tract: Secondary | ICD-10-CM | POA: Diagnosis not present

## 2015-08-12 DIAGNOSIS — R1011 Right upper quadrant pain: Secondary | ICD-10-CM

## 2015-08-13 ENCOUNTER — Ambulatory Visit (HOSPITAL_COMMUNITY)
Admission: RE | Admit: 2015-08-13 | Discharge: 2015-08-13 | Disposition: A | Discharge status: Home / Self Care | Payer: Medicare Other | Source: Ambulatory Visit | Attending: Interventional Radiology | Admitting: Interventional Radiology

## 2015-08-13 ENCOUNTER — Encounter (HOSPITAL_COMMUNITY): Payer: Self-pay

## 2015-08-13 DIAGNOSIS — IMO0002 Reserved for concepts with insufficient information to code with codable children: Secondary | ICD-10-CM

## 2015-08-13 DIAGNOSIS — Z882 Allergy status to sulfonamides status: Secondary | ICD-10-CM

## 2015-08-13 DIAGNOSIS — X58XXXA Exposure to other specified factors, initial encounter: Secondary | ICD-10-CM | POA: Diagnosis present

## 2015-08-13 DIAGNOSIS — R410 Disorientation, unspecified: Secondary | ICD-10-CM | POA: Diagnosis present

## 2015-08-13 DIAGNOSIS — F419 Anxiety disorder, unspecified: Secondary | ICD-10-CM | POA: Diagnosis present

## 2015-08-13 DIAGNOSIS — N401 Enlarged prostate with lower urinary tract symptoms: Secondary | ICD-10-CM | POA: Diagnosis present

## 2015-08-13 DIAGNOSIS — S22089A Unspecified fracture of T11-T12 vertebra, initial encounter for closed fracture: Secondary | ICD-10-CM | POA: Diagnosis present

## 2015-08-13 DIAGNOSIS — M4854XA Collapsed vertebra, not elsewhere classified, thoracic region, initial encounter for fracture: Secondary | ICD-10-CM

## 2015-08-13 DIAGNOSIS — S22080A Wedge compression fracture of T11-T12 vertebra, initial encounter for closed fracture: Secondary | ICD-10-CM | POA: Diagnosis not present

## 2015-08-13 DIAGNOSIS — N179 Acute kidney failure, unspecified: Secondary | ICD-10-CM | POA: Diagnosis not present

## 2015-08-13 DIAGNOSIS — N400 Enlarged prostate without lower urinary tract symptoms: Secondary | ICD-10-CM

## 2015-08-13 DIAGNOSIS — E875 Hyperkalemia: Secondary | ICD-10-CM | POA: Diagnosis present

## 2015-08-13 DIAGNOSIS — R339 Retention of urine, unspecified: Secondary | ICD-10-CM | POA: Diagnosis present

## 2015-08-13 DIAGNOSIS — R451 Restlessness and agitation: Secondary | ICD-10-CM | POA: Diagnosis present

## 2015-08-13 DIAGNOSIS — K5900 Constipation, unspecified: Secondary | ICD-10-CM | POA: Diagnosis not present

## 2015-08-13 DIAGNOSIS — M549 Dorsalgia, unspecified: Secondary | ICD-10-CM | POA: Diagnosis not present

## 2015-08-13 LAB — BASIC METABOLIC PANEL
ANION GAP: 13 (ref 5–15)
Anion gap: 11 (ref 5–15)
BUN: 55 mg/dL — ABNORMAL HIGH (ref 6–20)
BUN: 54 mg/dL — AB (ref 6–20)
CALCIUM: 9 mg/dL (ref 8.9–10.3)
CALCIUM: 8.9 mg/dL (ref 8.9–10.3)
CHLORIDE: 100 mmol/L — AB (ref 101–111)
CO2: 24 mmol/L (ref 22–32)
CO2: 25 mmol/L (ref 22–32)
CREATININE: 4.39 mg/dL — AB (ref 0.61–1.24)
Chloride: 103 mmol/L (ref 101–111)
Creatinine, Ser: 4.35 mg/dL — ABNORMAL HIGH (ref 0.61–1.24)
GFR calc non Af Amer: 11 mL/min — ABNORMAL LOW (ref >60)
GFR calc non Af Amer: 11 mL/min — ABNORMAL LOW (ref >60)
GFR, EST AFRICAN AMERICAN: 13 mL/min — AB (ref >60)
GFR, EST AFRICAN AMERICAN: 13 mL/min — AB (ref >60)
Glucose, Bld: 97 mg/dL (ref 65–99)
Glucose, Bld: 94 mg/dL (ref 65–99)
POTASSIUM: 4.9 mmol/L (ref 3.5–5.1)
Potassium: 4.7 mmol/L (ref 3.5–5.1)
Sodium: 137 mmol/L (ref 135–145)
Sodium: 139 mmol/L (ref 135–145)

## 2015-08-13 LAB — CBC
HEMATOCRIT: 40.1 % (ref 39.0–52.0)
HEMOGLOBIN: 13.3 g/dL (ref 13.0–17.0)
MCH: 32.3 pg (ref 26.0–34.0)
MCHC: 33.2 g/dL (ref 30.0–36.0)
MCV: 97.3 fL (ref 78.0–100.0)
Platelets: 250 10*3/uL (ref 150–400)
RBC: 4.12 MIL/uL — AB (ref 4.22–5.81)
RDW: 13.6 % (ref 11.5–15.5)
WBC: 9.4 10*3/uL (ref 4.0–10.5)

## 2015-08-13 LAB — APTT: aPTT: 31 seconds (ref 24–37)

## 2015-08-13 LAB — PROTIME-INR
INR: 1.24 (ref 0.00–1.49)
PROTHROMBIN TIME: 15.8 s — AB (ref 11.6–15.2)

## 2015-08-13 MED ORDER — MIDAZOLAM HCL 2 MG/2ML IJ SOLN
INTRAMUSCULAR | Status: AC
  Filled 2015-08-13: qty 4

## 2015-08-13 MED ORDER — SODIUM CHLORIDE 0.9 % IV SOLN
Freq: Once | INTRAVENOUS | Status: AC
  Administered 2015-08-13: 10:00:00 via INTRAVENOUS

## 2015-08-13 MED ORDER — CEFAZOLIN SODIUM-DEXTROSE 2-4 GM/100ML-% IV SOLN
INTRAVENOUS | Status: AC
  Filled 2015-08-13: qty 100

## 2015-08-13 MED ORDER — SODIUM CHLORIDE 0.9 % IV SOLN: INTRAVENOUS | Status: AC

## 2015-08-13 MED ORDER — HYDROMORPHONE HCL 1 MG/ML IJ SOLN
INTRAMUSCULAR | Status: AC
  Filled 2015-08-13: qty 1

## 2015-08-13 MED ORDER — FENTANYL CITRATE (PF) 100 MCG/2ML IJ SOLN
INTRAMUSCULAR | Status: AC
  Filled 2015-08-13: qty 4

## 2015-08-13 MED ORDER — FENTANYL CITRATE (PF) 100 MCG/2ML IJ SOLN
INTRAMUSCULAR | Status: AC | PRN
  Administered 2015-08-13: 25 ug via INTRAVENOUS

## 2015-08-13 MED ORDER — MIDAZOLAM HCL 2 MG/2ML IJ SOLN
INTRAMUSCULAR | Status: AC | PRN
  Administered 2015-08-13: 1 mg via INTRAVENOUS

## 2015-08-13 MED ORDER — CEFAZOLIN SODIUM-DEXTROSE 2-4 GM/100ML-% IV SOLN
2.0000 g | INTRAVENOUS | Status: AC
  Administered 2015-08-13: 2 g via INTRAVENOUS

## 2015-08-13 MED ORDER — BUPIVACAINE HCL (PF) 0.25 % IJ SOLN
INTRAMUSCULAR | Status: AC
  Administered 2015-08-13: 20 mL
  Filled 2015-08-13: qty 30

## 2015-08-13 MED ORDER — IOPAMIDOL (ISOVUE-300) INJECTION 61%
INTRAVENOUS | Status: AC
  Administered 2015-08-13: 1 mL
  Filled 2015-08-13: qty 50

## 2015-08-13 MED ORDER — TOBRAMYCIN SULFATE 1.2 G IJ SOLR
INTRAMUSCULAR | Status: AC
  Administered 2015-08-13: 1.2 g
  Filled 2015-08-13: qty 1.2

## 2015-08-13 NOTE — Discharge Instructions (Signed)
1.No stooping,bending or lifting more than 10 lbs for 2 weeks. 2.Use walker to ambulate for 2 weeks. 3.RTC in 2 weeks  KYPHOPLASTY/VERTEBROPLASTY DISCHARGE INSTRUCTIONS  Medications: (check all that apply)     Resume all home medications as before procedure.                       Continue your pain medications as prescribed as needed.  Over the next 3-5 days, decrease your pain medication as tolerated.  Over the counter medications (i.e. Tylenol, ibuprofen, and aleve) may be substituted once severe/moderate pain symptoms have subsided.   Wound Care: - Bandages may be removed the day following your procedure.  You may get your incision wet once bandages are removed.  Bandaids may be used to cover the incisions until scab formation.  Topical ointments are optional.  - If you develop a fever greater than 101 degrees, have increased skin redness at the incision sites or pus-like oozing from incisions occurring within 1 week of the procedure, contact radiology at [phone] or [phone].  - Ice pack to back for 15-20 minutes 2-3 time per day for first 2-3 days post procedure.  The ice will expedite muscle healing and help with the pain from the incisions.   Activity: - Bedrest today with limited activity for 24 hours post procedure.  - No driving for 48 hours.  - Increase your activity as tolerated after bedrest (with assistance if necessary).  - Refrain from any strenuous activity or heavy lifting (greater than 10 lbs.).   Follow up:  - If a biopsy was performed at the time of your procedure, your referring physician should receive the results in usually 2-3 days.

## 2015-08-13 NOTE — Procedures (Signed)
S/P T 12 balloon KP 

## 2015-08-13 NOTE — H&P (Signed)
Chief Complaint: back pain  Referring Physician:Dr. Joni Fears  Supervising Physician: Luanne Bras  Patient Status:Out-pt  HPI: [name] is an 82 y.o. male with no significant PMH who about 2 weeks ago began having back pain. He had twisted his back while lifting a box.  His pain became more and more severe.  He went to his Dr. Durward Fortes who set him up for an MRI which revealed an acute T12 compression fracture.  He has seen Dr. Estanislado Pandy and set up for a kyphoplasty.  He presents today for this procedure.  He has no other new complaints.  Past Medical History:  Past Medical History  Diagnosis Date  . Enlarged prostate     Past Surgical History: History reviewed. No pertinent past surgical history.  Family History: History reviewed. No pertinent family history.  Social History:  reports that he has never smoked. He does not have any smokeless tobacco history on file. He reports that he does not drink alcohol. His drug history is not on file.  Allergies:  Allergies  Allergen Reactions  . Ciprofloxacin Hives  . Sulfa Antibiotics Hives    Medications:   Medication List    ASK your doctor about these medications        ALEVE 220 MG tablet  Generic drug:  naproxen sodium  Take 220 mg by mouth every 8 (eight) hours as needed (For pain.).     alfuzosin 10 MG 24 hr tablet  Commonly known as:  UROXATRAL  Take 10 mg by mouth daily after supper.     Eszopiclone 3 MG Tabs  Take 3 mg by mouth at bedtime.     HYDROcodone-acetaminophen 5-325 MG tablet  Commonly known as:  NORCO/VICODIN  Take 1 tablet by mouth every 6 (six) hours as needed for severe pain.     multivitamin with minerals Tabs tablet  Take 1 tablet by mouth daily.     SYSTANE OVERNIGHT THERAPY 0.3 % Gel ophthalmic ointment  Generic drug:  hypromellose  Place 1 application into the left eye at bedtime.     TRAVATAN Z 0.004 % Soln ophthalmic solution  Generic drug:  Travoprost (BAK Free)   Place 1 drop into both eyes at bedtime.        Please HPI for pertinent positives, otherwise complete 10 system ROS negative, except recent weight loss.  Mallampati Score: MD Evaluation Airway: WNL Heart: WNL Abdomen: WNL Chest/ Lungs: WNL ASA  Classification: 2 Mallampati/Airway Score: Two  Physical Exam: BP 127/82 mmHg  Pulse 86  Temp(Src) 97.9 F (36.6 C) (Oral)  Resp 18  Ht 5\' 10"  (1.778 m)  Wt 137 lb (62.143 kg)  BMI 19.66 kg/m2  SpO2 96% Body mass index is 19.66 kg/(m^2). General: pleasant, WD, WN white male who is laying in bed in NAD HEENT: head is normocephalic, atraumatic.  Sclera are noninjected.  PERRL.  Ears and nose without any masses or lesions.  Mouth is pink and moist Heart: regular, rate, and rhythm.  Normal s1,s2. No obvious murmurs, gallops, or rubs noted.  Palpable radial and pedal pulses bilaterally Lungs: CTAB, no wheezes, rhonchi, or rales noted.  Respiratory effort nonlabored Abd: soft, NT, ND, +BS, no masses, hernias, or organomegaly MS: all 4 extremities are symmetrical with no cyanosis, clubbing, or edema. Psych: A&Ox3 with an appropriate affect, but very forgetful as he relies on his wife for most of the information provided   Labs: Pending  Imaging: No results found.  Assessment/Plan 1. T12  compression fracture -we will plan for T12 kyphoplasty vs vertebroplasty, if unable to do KP.  No need for a bone biopsy. -vitals have been reviewed.  His labs are currently pending -the patient did request some vicodin after his procedure.  I have written him for vicodin 5/325mg  #20. -Risks and Benefits discussed with the patient including, but not limited to education regarding the natural healing process of compression fractures without intervention, bleeding, infection, cement migration which may cause spinal cord damage, paralysis, pulmonary embolism or even death. All of the patient's questions were answered, patient is agreeable to  proceed. Consent signed and in chart.   Thank you for this interesting consult.  I greatly enjoyed meeting [name] and look forward to participating in their care.  A copy of this report was sent to the requesting provider on this date.  Electronically Signed: Henreitta Cea 08/13/2015, 10:11 AM   I spent a total of    25 Minutes in face to face in clinical consultation, greater than 50% of which was counseling/coordinating care for T12 compression fracture

## 2015-08-15 ENCOUNTER — Encounter (HOSPITAL_COMMUNITY): Payer: Self-pay | Admitting: Emergency Medicine

## 2015-08-15 ENCOUNTER — Observation Stay (HOSPITAL_COMMUNITY): Payer: Medicare Other

## 2015-08-15 ENCOUNTER — Inpatient Hospital Stay (HOSPITAL_COMMUNITY)
Admission: EM | Admit: 2015-08-15 | Discharge: 2015-08-19 | DRG: 982 | Disposition: A | Discharge status: Skilled Nursing Facility | Payer: Medicare Other | Attending: Internal Medicine | Admitting: Internal Medicine

## 2015-08-15 DIAGNOSIS — R339 Retention of urine, unspecified: Secondary | ICD-10-CM | POA: Diagnosis not present

## 2015-08-15 DIAGNOSIS — N179 Acute kidney failure, unspecified: Secondary | ICD-10-CM | POA: Diagnosis not present

## 2015-08-15 DIAGNOSIS — E875 Hyperkalemia: Secondary | ICD-10-CM | POA: Diagnosis not present

## 2015-08-15 DIAGNOSIS — IMO0002 Reserved for concepts with insufficient information to code with codable children: Secondary | ICD-10-CM

## 2015-08-15 DIAGNOSIS — K5900 Constipation, unspecified: Secondary | ICD-10-CM

## 2015-08-15 DIAGNOSIS — R8299 Other abnormal findings in urine: Secondary | ICD-10-CM | POA: Diagnosis not present

## 2015-08-15 LAB — BASIC METABOLIC PANEL
ANION GAP: 6 (ref 5–15)
Anion gap: 12 (ref 5–15)
BUN: 66 mg/dL — AB (ref 6–20)
BUN: 53 mg/dL — ABNORMAL HIGH (ref 6–20)
CALCIUM: 8.6 mg/dL — AB (ref 8.9–10.3)
CHLORIDE: 104 mmol/L (ref 101–111)
CHLORIDE: 107 mmol/L (ref 101–111)
CO2: 23 mmol/L (ref 22–32)
CO2: 23 mmol/L (ref 22–32)
CREATININE: 5.55 mg/dL — AB (ref 0.61–1.24)
Calcium: 8.1 mg/dL — ABNORMAL LOW (ref 8.9–10.3)
Creatinine, Ser: 3.63 mg/dL — ABNORMAL HIGH (ref 0.61–1.24)
GFR calc Af Amer: 16 mL/min — ABNORMAL LOW (ref >60)
GFR calc non Af Amer: 8 mL/min — ABNORMAL LOW (ref >60)
GFR, EST AFRICAN AMERICAN: 10 mL/min — AB (ref >60)
GFR, EST NON AFRICAN AMERICAN: 14 mL/min — AB (ref >60)
GLUCOSE: 136 mg/dL — AB (ref 65–99)
Glucose, Bld: 110 mg/dL — ABNORMAL HIGH (ref 65–99)
POTASSIUM: 4.6 mmol/L (ref 3.5–5.1)
Potassium: 4.8 mmol/L (ref 3.5–5.1)
SODIUM: 139 mmol/L (ref 135–145)
SODIUM: 136 mmol/L (ref 135–145)

## 2015-08-15 LAB — CBC WITH DIFFERENTIAL/PLATELET
BASOS PCT: 0 %
Basophils Absolute: 0 10*3/uL (ref 0.0–0.1)
EOS ABS: 0.3 10*3/uL (ref 0.0–0.7)
EOS PCT: 4 %
HCT: 35.3 % — ABNORMAL LOW (ref 39.0–52.0)
Hemoglobin: 11.9 g/dL — ABNORMAL LOW (ref 13.0–17.0)
LYMPHS ABS: 0.7 10*3/uL (ref 0.7–4.0)
Lymphocytes Relative: 9 %
MCH: 32.5 pg (ref 26.0–34.0)
MCHC: 33.7 g/dL (ref 30.0–36.0)
MCV: 96.4 fL (ref 78.0–100.0)
MONO ABS: 0.8 10*3/uL (ref 0.1–1.0)
MONOS PCT: 10 %
Neutro Abs: 6.1 10*3/uL (ref 1.7–7.7)
Neutrophils Relative %: 77 %
Platelets: 244 10*3/uL (ref 150–400)
RBC: 3.66 MIL/uL — ABNORMAL LOW (ref 4.22–5.81)
RDW: 13.5 % (ref 11.5–15.5)
WBC: 7.8 10*3/uL (ref 4.0–10.5)

## 2015-08-15 LAB — I-STAT CHEM 8, ED
BUN: 61 mg/dL — AB (ref 6–20)
CALCIUM ION: 1.1 mmol/L — AB (ref 1.12–1.23)
CHLORIDE: 102 mmol/L (ref 101–111)
CREATININE: 6.3 mg/dL — AB (ref 0.61–1.24)
GLUCOSE: 116 mg/dL — AB (ref 65–99)
HCT: 36 % — ABNORMAL LOW (ref 39.0–52.0)
Hemoglobin: 12.2 g/dL — ABNORMAL LOW (ref 13.0–17.0)
Potassium: 5.2 mmol/L — ABNORMAL HIGH (ref 3.5–5.1)
SODIUM: 137 mmol/L (ref 135–145)
TCO2: 25 mmol/L (ref 0–100)

## 2015-08-15 LAB — URINALYSIS, ROUTINE W REFLEX MICROSCOPIC
BILIRUBIN URINE: NEGATIVE
GLUCOSE, UA: NEGATIVE mg/dL
HGB URINE DIPSTICK: NEGATIVE
Ketones, ur: NEGATIVE mg/dL
Leukocytes, UA: NEGATIVE
Nitrite: NEGATIVE
PH: 5.5 (ref 5.0–8.0)
Protein, ur: NEGATIVE mg/dL
SPECIFIC GRAVITY, URINE: 1.021 (ref 1.005–1.030)

## 2015-08-15 LAB — SODIUM, URINE, RANDOM: Sodium, Ur: 110 mmol/L

## 2015-08-15 MED ORDER — ACETAMINOPHEN 325 MG PO TABS
650.0000 mg | ORAL_TABLET | Freq: Four times a day (QID) | ORAL | Status: DC | PRN
  Administered 2015-08-15 – 2015-08-19 (×3): 650 mg via ORAL
  Filled 2015-08-15 (×2): qty 2

## 2015-08-15 MED ORDER — TRAMADOL HCL 50 MG PO TABS
50.0000 mg | ORAL_TABLET | Freq: Two times a day (BID) | ORAL | Status: DC | PRN
  Administered 2015-08-15: 50 mg via ORAL
  Filled 2015-08-15: qty 1

## 2015-08-15 MED ORDER — SODIUM CHLORIDE 0.9% FLUSH
3.0000 mL | Freq: Two times a day (BID) | INTRAVENOUS | Status: DC
Start: 2015-08-15 — End: 2015-08-19
  Administered 2015-08-16 – 2015-08-19 (×7): 3 mL via INTRAVENOUS

## 2015-08-15 MED ORDER — SODIUM CHLORIDE 0.9 % IV BOLUS (SEPSIS)
500.0000 mL | Freq: Once | INTRAVENOUS | Status: AC
  Administered 2015-08-15: 500 mL via INTRAVENOUS

## 2015-08-15 MED ORDER — LATANOPROST 0.005 % OP SOLN
1.0000 [drp] | Freq: Every day | OPHTHALMIC | Status: DC
  Administered 2015-08-15 – 2015-08-18 (×4): 1 [drp] via OPHTHALMIC
  Filled 2015-08-15: qty 2.5

## 2015-08-15 MED ORDER — POLYETHYLENE GLYCOL 3350 17 G PO PACK: 17.0000 g | PACK | Freq: Every day | ORAL | Status: DC | PRN

## 2015-08-15 MED ORDER — SODIUM CHLORIDE 0.9 % IV SOLN: INTRAVENOUS | Status: DC

## 2015-08-15 MED ORDER — POLYETHYLENE GLYCOL 3350 17 G PO PACK: 17.0000 g | PACK | Freq: Every day | ORAL | Status: DC

## 2015-08-15 MED ORDER — TRAZODONE HCL 50 MG PO TABS
25.0000 mg | ORAL_TABLET | Freq: Every evening | ORAL | Status: DC | PRN
  Administered 2015-08-15: 25 mg via ORAL
  Filled 2015-08-15: qty 1

## 2015-08-15 MED ORDER — ZOLPIDEM TARTRATE 5 MG PO TABS: 5.0000 mg | ORAL_TABLET | Freq: Every evening | ORAL | Status: DC | PRN

## 2015-08-15 MED ORDER — ONDANSETRON HCL 4 MG/2ML IJ SOLN: 4.0000 mg | Freq: Four times a day (QID) | INTRAMUSCULAR | Status: DC | PRN

## 2015-08-15 MED ORDER — BISACODYL 10 MG RE SUPP
10.0000 mg | Freq: Once | RECTAL | Status: AC
Start: 2015-08-15 — End: 2015-08-15
  Administered 2015-08-15: 10 mg via RECTAL
  Filled 2015-08-15: qty 1

## 2015-08-15 MED ORDER — HEPARIN SODIUM (PORCINE) 5000 UNIT/ML IJ SOLN
5000.0000 [IU] | Freq: Three times a day (TID) | INTRAMUSCULAR | Status: DC
Start: 2015-08-15 — End: 2015-08-19
  Administered 2015-08-15 – 2015-08-19 (×14): 5000 [IU] via SUBCUTANEOUS
  Filled 2015-08-15 (×10): qty 1

## 2015-08-15 MED ORDER — ONDANSETRON HCL 4 MG PO TABS
4.0000 mg | ORAL_TABLET | Freq: Four times a day (QID) | ORAL | Status: DC | PRN
Start: 2015-08-15 — End: 2015-08-19

## 2015-08-15 MED ORDER — POLYETHYLENE GLYCOL 3350 17 G PO PACK
17.0000 g | PACK | Freq: Every day | ORAL | Status: DC
  Administered 2015-08-15 – 2015-08-19 (×5): 17 g via ORAL
  Filled 2015-08-15 (×5): qty 1

## 2015-08-15 MED ORDER — SODIUM CHLORIDE 0.9 % IV SOLN
Freq: Once | INTRAVENOUS | Status: AC
  Administered 2015-08-15: 08:00:00 via INTRAVENOUS

## 2015-08-15 MED ORDER — ACETAMINOPHEN 650 MG RE SUPP: 650.0000 mg | Freq: Four times a day (QID) | RECTAL | Status: DC | PRN

## 2015-08-15 MED ORDER — BISACODYL 5 MG PO TBEC
5.0000 mg | DELAYED_RELEASE_TABLET | Freq: Every day | ORAL | Status: DC | PRN
  Filled 2015-08-15: qty 1

## 2015-08-15 NOTE — Progress Notes (Signed)
Repeat BMET reviewed  Cr has improved with IV fluids  3.65  <<<<  5.5 <<<< 6.3.   Continue maintenance IVF, no NSAIDS. Ultram BID ordered for pain. BMET in am. Hopefully will continue to improve, if not then consider Renal consult

## 2015-08-15 NOTE — ED Provider Notes (Signed)
CSN: [id number]     Arrival date & time 08/15/15  0319 History   First MD Initiated Contact with Patient 08/15/15 0459     Chief Complaint  Patient presents with  . Back Pain      HPI  Patient presents for evaluation of difficulty having a bowel movement, and urinary retention.  Patient had a fall and had a diagnosed T12 compression fracture. Seen by orthopedics and referred to Dr.  Estanislado Pandy. He then underwent T12 kyphoplasty on Friday, 2 days ago.  His pain in his back is considerably better. However he has not had a bowel movement since Wednesday, 5 days ago. He has not urinated since Friday morning, almost 48 hours ago.  No numbness weakness or lower extremity symptoms.  Past Medical History  Diagnosis Date  . Enlarged prostate    History reviewed. No pertinent past surgical history. No family history on file. Social History  Substance Use Topics  . Smoking status: Never Smoker   . Smokeless tobacco: None  . Alcohol Use: No    Review of Systems  Constitutional: Negative for fever, chills, diaphoresis, appetite change and fatigue.  HENT: Negative for mouth sores, sore throat and trouble swallowing.   Eyes: Negative for visual disturbance.  Respiratory: Negative for cough, chest tightness, shortness of breath and wheezing.   Cardiovascular: Negative for chest pain.  Gastrointestinal: Positive for constipation. Negative for nausea, vomiting, abdominal pain, diarrhea and abdominal distention.  Endocrine: Negative for polydipsia, polyphagia and polyuria.  Genitourinary: Positive for difficulty urinating. Negative for dysuria, frequency and hematuria.  Musculoskeletal: Positive for back pain. Negative for gait problem.  Skin: Negative for color change, pallor and rash.  Neurological: Negative for dizziness, syncope, light-headedness and headaches.  Hematological: Does not bruise/bleed easily.  Psychiatric/Behavioral: Negative for behavioral problems and confusion.       Allergies  Ciprofloxacin and Sulfa antibiotics  Home Medications   Prior to Admission medications   Medication Sig Start Date End Date Taking? Authorizing Provider  alfuzosin (UROXATRAL) 10 MG 24 hr tablet Take 10 mg by mouth daily after supper.   Yes Historical Provider, MD  Eszopiclone 3 MG TABS Take 3 mg by mouth at bedtime. 06/20/15  Yes Historical Provider, MD  HYDROcodone-acetaminophen (NORCO/VICODIN) 5-325 MG tablet Take 1 tablet by mouth every 6 (six) hours as needed for severe pain. 08/10/15  Yes Quintella Reichert, MD  hypromellose (SYSTANE OVERNIGHT THERAPY) 0.3 % GEL ophthalmic ointment Place 1 application into the left eye at bedtime.   Yes Historical Provider, MD  Multiple Vitamin (MULTIVITAMIN WITH MINERALS) TABS tablet Take 1 tablet by mouth daily.   Yes Historical Provider, MD  naproxen sodium (ALEVE) 220 MG tablet Take 220 mg by mouth every 8 (eight) hours as needed (For pain.).   Yes Historical Provider, MD  TRAVATAN Z 0.004 % SOLN ophthalmic solution Place 1 drop into both eyes at bedtime. 05/05/15  Yes Historical Provider, MD   BP 121/79 mmHg  Pulse 81  Temp(Src) 98 F (36.7 C) (Oral)  Resp 16  SpO2 98% Physical Exam  Constitutional: He is oriented to person, place, and time. He appears well-developed and well-nourished. No distress.  HENT:  Head: Normocephalic.  Eyes: Conjunctivae are normal. Pupils are equal, round, and reactive to light. No scleral icterus.  Neck: Normal range of motion. Neck supple. No thyromegaly present.  Cardiovascular: Normal rate and regular rhythm.  Exam reveals no gallop and no friction rub.   No murmur heard. Pulmonary/Chest: Effort normal and breath sounds  normal. No respiratory distress. He has no wheezes. He has no rales.  Abdominal: Soft. Bowel sounds are normal. He exhibits no distension. There is no tenderness. There is no rebound.    Genitourinary:     Musculoskeletal: Normal range of motion.  Neurological: He is alert  and oriented to person, place, and time.  Skin: Skin is warm and dry. No rash noted.  Psychiatric: He has a normal mood and affect. His behavior is normal.    ED Course  Procedures (including critical care time) Labs Review Labs Reviewed  URINALYSIS, ROUTINE W REFLEX MICROSCOPIC (NOT AT [facility]) - Abnormal; Notable for the following:    Color, Urine AMBER (*)    All other components within normal limits  I-STAT CHEM 8, ED - Abnormal; Notable for the following:    Potassium 5.2 (*)    BUN 61 (*)    Creatinine, Ser 6.30 (*)    Glucose, Bld 116 (*)    Calcium, Ion 1.10 (*)    Hemoglobin 12.2 (*)    HCT 36.0 (*)    All other components within normal limits  CBC WITH DIFFERENTIAL/PLATELET  BASIC METABOLIC PANEL    Imaging Review No results found. I have personally reviewed and evaluated these images and lab results as part of my medical decision-making.   EKG Interpretation None      MDM   Final diagnoses:  Acute kidney injury [facility])  Urinary retention    Plan  : Foley catheter. Will need leg bag for bladder stretch injury. I-STAT to make to confirm renal function. Will needs softener, when necessary laxative.  06:26:  I-STAT shows a creatinine of 6.3. He was 4.36 on Friday just 2 days ago. He reports no history of known renal insufficiency and has no prior labs in our system.    Tanna Furry, MD 08/15/15 [phone]

## 2015-08-15 NOTE — ED Notes (Signed)
Pt. reports worsening low back pain onset last week unrelieved by prescription Hydrocodone , denies fall or injury.  No hematuria or fever .

## 2015-08-15 NOTE — ED Notes (Signed)
Admitting at bedside 

## 2015-08-15 NOTE — H&P (Signed)
History and Physical    [name] [id number] DOB: 21-Sep-1925 DOA: 08/15/2015  PCP: Wenda Low, MD  Patient coming from:  Home    Chief Complaint: constipation / urinary retention  HPI: [name] is a 82 y.o. male in relatively good health. Several days ago patient sustained a T12 compression fracture for which he has been taking NSAIDs and pain medications. He underwent T12 kyphoplasty 3 days ago. On that same day patient was found to be in acute renal failure with Cr of 4.35. He was discharged home Friday following the procedure. As instructed, patient laid flat in bed for 24 hours. He ambulated some yesterday but it was painful. During the night patient awoke his wife with complaints of back pain. He had not urinated in nearly two days and last BM was 4-5 days ago. Patient has not previous history of urinary retention. He has occasional constipation but not to this degree. Patient took a senokot yesterday but no results. Denies nausea. He has been consuming "moderate" amount of fluids but wife disagrees Foley catheter placed   ED Course:  Afebrile, vital signs stable Normal WBC. Hemoglobin 11.9 Potassium 5.2 BUN 61, creatinine 6.3 500 cc bolus  Review of Systems: As per HPI, otherwise 10 point review of systems negative.    Past Medical History  Diagnosis Date  . Enlarged prostate     Past Surgical History  Procedure Laterality Date  . Cataract extraction    . Kyphoplasty    . Carpal tunnel release Bilateral   . Abdominal hernia repair      Social History   Social History  . Marital Status: Married    Spouse Name: N/A  . Number of Children: N/A  . Years of Education: N/A   Occupational History  . Not on file.   Social History Main Topics  . Smoking status: Never Smoker   . Smokeless tobacco: Not on file  . Alcohol Use: No  . Drug Use: Not on file  . Sexual Activity: Not on file   Other Topics Concern  . Not on file   Social History  Narrative    Allergies  Allergen Reactions  . Ciprofloxacin Hives  . Sulfa Antibiotics Hives    Family History  Problem Relation Age of Onset  . Heart attack      Prior to Admission medications   Medication Sig Start Date End Date Taking? Authorizing Provider  alfuzosin (UROXATRAL) 10 MG 24 hr tablet Take 10 mg by mouth daily after supper.   Yes Historical Provider, MD  Eszopiclone 3 MG TABS Take 3 mg by mouth at bedtime. 06/20/15  Yes Historical Provider, MD  HYDROcodone-acetaminophen (NORCO/VICODIN) 5-325 MG tablet Take 1 tablet by mouth every 6 (six) hours as needed for severe pain. 08/10/15  Yes Quintella Reichert, MD  hypromellose (SYSTANE OVERNIGHT THERAPY) 0.3 % GEL ophthalmic ointment Place 1 application into the left eye at bedtime.   Yes Historical Provider, MD  Multiple Vitamin (MULTIVITAMIN WITH MINERALS) TABS tablet Take 1 tablet by mouth daily.   Yes Historical Provider, MD  naproxen sodium (ALEVE) 220 MG tablet Take 220 mg by mouth every 8 (eight) hours as needed (For pain.).   Yes Historical Provider, MD  TRAVATAN Z 0.004 % SOLN ophthalmic solution Place 1 drop into both eyes at bedtime. 05/05/15  Yes Historical Provider, MD    Physical Exam: Filed Vitals:   08/15/15 0500 08/15/15 0515 08/15/15 0545 08/15/15 0548  BP: 135/83  121/79   Pulse:  87 81   Temp:      TempSrc:      Resp:      SpO2:  97% 97% 98%    Constitutional:  Pleasant thin, white male in NAD, calm, comfortable Filed Vitals:   08/15/15 0500 08/15/15 0515 08/15/15 0545 08/15/15 0548  BP: 135/83  121/79   Pulse:  87 81   Temp:      TempSrc:      Resp:      SpO2:  97% 97% 98%   Eyes: PER, lids and conjunctivae normal ENMT: Mucous membranes are moist. Posterior pharynx clear of any exudate or lesions..  Neck: normal, supple, no masses Respiratory: clear to auscultation bilaterally, no wheezing, no crackles. Normal respiratory effort. No accessory muscle use.  Cardiovascular: Regular rate and rhythm,  no murmurs / rubs / gallops. No extremity edema. 2+ pedal pulses.   Abdomen: soft, mild- mod distention with tympany. Nno tenderness, no masses palpated. Hypoactive bowel sounds.   Musculoskeletal: no clubbing / cyanosis. No joint deformity upper and lower extremities. Good ROM, no contractures. Normal muscle tone.  Skin: no rashes, lesions, ulcers.  Neurologic: CN 2-12 grossly intact. Sensation intact, Strength 5/5 in all 4.  Psychiatric: Normal judgment and insight. Alert and oriented x 3. Normal mood.   Labs on Admission: I have personally reviewed following labs and imaging studies   Urine analysis:    Component Value Date/Time   COLORURINE AMBER* 08/15/2015 0542   APPEARANCEUR CLEAR 08/15/2015 0542   LABSPEC 1.021 08/15/2015 0542   PHURINE 5.5 08/15/2015 0542   GLUCOSEU NEGATIVE 08/15/2015 0542   HGBUR NEGATIVE 08/15/2015 0542   BILIRUBINUR NEGATIVE 08/15/2015 0542   KETONESUR NEGATIVE 08/15/2015 0542   PROTEINUR NEGATIVE 08/15/2015 0542   UROBILINOGEN 0.2 08/03/2007 0928   NITRITE NEGATIVE 08/15/2015 0542   LEUKOCYTESUR NEGATIVE 08/15/2015 0542    Radiological Exams on Admission: No results found.  EKG: Independently reviewed.   EKG Interpretation  Date/Time:    Ventricular Rate:    PR Interval:    QRS Duration:   QT Interval:    QTC Calculation:   R Axis:     Text Interpretation:        Assessment/Plan   Principal Problem:   Acute renal failure (HCC) Active Problems:   AKI (acute kidney injury) ([place])   Constipation   Urinary retention   Hyperkalemia      Acute renal failure .   Cr 6.3, up from 4.35 three days ago. Baseline renal function unknown but presumed normal.          -Discontinue home NSAIDS -IVF. Received 548ml bolus in ED, start maintenance at 75/hr -avoid nephrotoxic medications.  -repeat BMET at 2pm today and in am  Hyperkalemia in setting of ARF.  -IV fluids -monitor on tele -await 2pm bmet  Urinary retention, post- op .  Multifactorial (limited mobility, narcotics).  -foley already placed with 1000cc output. Continue foley for now     Constipation. Secondary to limited mobility, recent surgical procedure, narcotics.  Abdomen distended with tympany, he may an ileus.  -Dulcolax supp has previously worked for him so will start with that.  If insufficient results will give enema -add miralax.                                  DVT prophylaxis:   Lovenox  Code Status:   Full code Family Communication:   Spoke with wife  in room, she understands and agrees with plan of treatment..  Disposition Plan: Discharge home in 24-48 hours              Consults called:   None  Admission status:  Observation - Medical bed / telemetry  Admission -  Medical bed  Telemetry    Tye Savoy NP [facility] Pager [phone]  If 7PM-7AM, please contact night-coverage [email or web address] Password [email or web address]  08/15/2015, 7:14 AM     Addendum,  ARF No nsaids check urine sodium, urine creatinine, urine eosinophils, renal ultrasound, spep, upep Please obtain nephrology consultation and try to obtain baseline creatinine tomorrow.  Andres Ege.D.

## 2015-08-15 NOTE — Progress Notes (Addendum)
Admission note:  Arrival Method: Patient arrived from ED on stretcher accompanied by the staff and wife. Mental Orientation:Alert and oriented x 4. Telemetry: 6E01, CCMD notified, NSR. Assessment: See doc flow sheets. Skin: Warm, dry and intact, no open wounds are any bruising noted.  Assessed by two nurses Jonni Sanger). IV: Right forearm NS 100 ml/hr. Pain: Denies any pain. Tubes: Foley draining light pink urine with some sediments. Safety Measures: Bed in low position, call bell and phone within reach. Fall Prevention Safety Plan: Reviewed prevention plan, understood and acknowledged. Admission Screening: In progress. 6700 Orientation: Patient has been oriented to the unit, staff and to the room.

## 2015-08-16 DIAGNOSIS — N179 Acute kidney failure, unspecified: Secondary | ICD-10-CM | POA: Diagnosis not present

## 2015-08-16 DIAGNOSIS — R339 Retention of urine, unspecified: Secondary | ICD-10-CM | POA: Diagnosis not present

## 2015-08-16 DIAGNOSIS — R278 Other lack of coordination: Secondary | ICD-10-CM | POA: Diagnosis not present

## 2015-08-16 DIAGNOSIS — H40059 Ocular hypertension, unspecified eye: Secondary | ICD-10-CM | POA: Diagnosis not present

## 2015-08-16 DIAGNOSIS — T148 Other injury of unspecified body region: Secondary | ICD-10-CM | POA: Diagnosis not present

## 2015-08-16 DIAGNOSIS — N401 Enlarged prostate with lower urinary tract symptoms: Secondary | ICD-10-CM | POA: Diagnosis present

## 2015-08-16 DIAGNOSIS — E875 Hyperkalemia: Secondary | ICD-10-CM | POA: Diagnosis not present

## 2015-08-16 DIAGNOSIS — E569 Vitamin deficiency, unspecified: Secondary | ICD-10-CM | POA: Diagnosis not present

## 2015-08-16 DIAGNOSIS — F419 Anxiety disorder, unspecified: Secondary | ICD-10-CM | POA: Diagnosis present

## 2015-08-16 DIAGNOSIS — R451 Restlessness and agitation: Secondary | ICD-10-CM | POA: Diagnosis present

## 2015-08-16 DIAGNOSIS — R410 Disorientation, unspecified: Secondary | ICD-10-CM | POA: Diagnosis present

## 2015-08-16 DIAGNOSIS — K5900 Constipation, unspecified: Secondary | ICD-10-CM | POA: Diagnosis not present

## 2015-08-16 DIAGNOSIS — M6281 Muscle weakness (generalized): Secondary | ICD-10-CM | POA: Diagnosis not present

## 2015-08-16 DIAGNOSIS — X58XXXA Exposure to other specified factors, initial encounter: Secondary | ICD-10-CM | POA: Diagnosis present

## 2015-08-16 DIAGNOSIS — R2689 Other abnormalities of gait and mobility: Secondary | ICD-10-CM | POA: Diagnosis not present

## 2015-08-16 DIAGNOSIS — R262 Difficulty in walking, not elsewhere classified: Secondary | ICD-10-CM | POA: Diagnosis not present

## 2015-08-16 DIAGNOSIS — N189 Chronic kidney disease, unspecified: Secondary | ICD-10-CM | POA: Diagnosis not present

## 2015-08-16 DIAGNOSIS — K5909 Other constipation: Secondary | ICD-10-CM | POA: Diagnosis not present

## 2015-08-16 DIAGNOSIS — G8929 Other chronic pain: Secondary | ICD-10-CM | POA: Diagnosis not present

## 2015-08-16 DIAGNOSIS — S22080D Wedge compression fracture of T11-T12 vertebra, subsequent encounter for fracture with routine healing: Secondary | ICD-10-CM | POA: Diagnosis not present

## 2015-08-16 DIAGNOSIS — S22089A Unspecified fracture of T11-T12 vertebra, initial encounter for closed fracture: Secondary | ICD-10-CM | POA: Diagnosis present

## 2015-08-16 DIAGNOSIS — I10 Essential (primary) hypertension: Secondary | ICD-10-CM | POA: Diagnosis not present

## 2015-08-16 DIAGNOSIS — H409 Unspecified glaucoma: Secondary | ICD-10-CM | POA: Diagnosis not present

## 2015-08-16 LAB — BASIC METABOLIC PANEL
ANION GAP: 7 (ref 5–15)
BUN: 23 mg/dL — ABNORMAL HIGH (ref 6–20)
CALCIUM: 8.5 mg/dL — AB (ref 8.9–10.3)
CHLORIDE: 104 mmol/L (ref 101–111)
CO2: 26 mmol/L (ref 22–32)
Creatinine, Ser: 0.89 mg/dL (ref 0.61–1.24)
GFR calc non Af Amer: >60 mL/min (ref >60)
Glucose, Bld: 98 mg/dL (ref 65–99)
Potassium: 4.3 mmol/L (ref 3.5–5.1)
Sodium: 137 mmol/L (ref 135–145)

## 2015-08-16 LAB — CBC
HCT: 37.9 % — ABNORMAL LOW (ref 39.0–52.0)
HEMOGLOBIN: 12.5 g/dL — AB (ref 13.0–17.0)
MCH: 31.9 pg (ref 26.0–34.0)
MCHC: 33 g/dL (ref 30.0–36.0)
MCV: 96.7 fL (ref 78.0–100.0)
Platelets: 276 10*3/uL (ref 150–400)
RBC: 3.92 MIL/uL — AB (ref 4.22–5.81)
RDW: 13.3 % (ref 11.5–15.5)
WBC: 5.7 10*3/uL (ref 4.0–10.5)

## 2015-08-16 LAB — PROTEIN ELECTROPHORESIS, SERUM
A/G RATIO SPE: 0.8 (ref 0.7–1.7)
ALPHA-1-GLOBULIN: 0.4 g/dL (ref 0.0–0.4)
ALPHA-2-GLOBULIN: 0.8 g/dL (ref 0.4–1.0)
Albumin ELP: 2.6 g/dL — ABNORMAL LOW (ref 2.9–4.4)
Beta Globulin: 1.6 g/dL — ABNORMAL HIGH (ref 0.7–1.3)
GLOBULIN, TOTAL: 3.3 g/dL (ref 2.2–3.9)
Gamma Globulin: 0.5 g/dL (ref 0.4–1.8)
M-Spike, %: 0.6 g/dL — ABNORMAL HIGH
Total Protein ELP: 5.9 g/dL — ABNORMAL LOW (ref 6.0–8.5)

## 2015-08-16 MED ORDER — ALFUZOSIN HCL ER 10 MG PO TB24
10.0000 mg | ORAL_TABLET | Freq: Every day | ORAL | Status: DC
  Administered 2015-08-16 – 2015-08-19 (×4): 10 mg via ORAL
  Filled 2015-08-16 (×4): qty 1

## 2015-08-16 MED ORDER — ENSURE ENLIVE PO LIQD
237.0000 mL | Freq: Every day | ORAL | Status: DC
  Administered 2015-08-18 – 2015-08-19 (×2): 237 mL via ORAL

## 2015-08-16 MED ORDER — HALOPERIDOL LACTATE 5 MG/ML IJ SOLN
2.0000 mg | Freq: Four times a day (QID) | INTRAMUSCULAR | Status: DC | PRN
  Administered 2015-08-17: 2 mg via INTRAVENOUS
  Filled 2015-08-16: qty 1

## 2015-08-16 MED ORDER — ZOLPIDEM TARTRATE 5 MG PO TABS
5.0000 mg | ORAL_TABLET | Freq: Every evening | ORAL | Status: DC | PRN
  Administered 2015-08-16: 5 mg via ORAL
  Filled 2015-08-16: qty 1

## 2015-08-16 MED ORDER — LORAZEPAM 2 MG/ML IJ SOLN
0.5000 mg | Freq: Once | INTRAMUSCULAR | Status: AC
  Administered 2015-08-16: 0.5 mg via INTRAVENOUS
  Filled 2015-08-16: qty 1

## 2015-08-16 NOTE — Care Management Obs Status (Signed)
[facility] NOTIFICATION   Patient Details  Name: DAG ROHLOFF MRN: [id number] Date of Birth: 11/18/1925   Medicare Observation Status Notification Given:  Yes    Maki Hege, Rory Percy, RN 08/16/2015, 1:34 PM

## 2015-08-16 NOTE — Progress Notes (Signed)
Initial Nutrition Assessment  DOCUMENTATION CODES:   Not applicable  INTERVENTION:  Provide Ensure Enlive po once daily, each supplement provides 350 kcal and 20 grams of protein.  Encourage adequate PO intake.   NUTRITION DIAGNOSIS:   Increased nutrient needs related to acute illness as evidenced by estimated needs.  GOAL:   Patient will meet greater than or equal to 90% of their needs  MONITOR:   PO intake, Supplement acceptance, Weight trends, Labs, I & O's  REASON FOR ASSESSMENT:   Malnutrition Screening Tool    ASSESSMENT:   Patient presents for evaluation of difficulty having a bowel movement, and urinary retention. Patient had a fall and had a diagnosed T12 compression fracture. Seen by orthopedics and referred to Dr. Estanislado Pandy. He then underwent T12 kyphoplasty on Friday,6/30 . Pt with AKI.  Meal completion has been 50-100%. Wife at bedside. Pt was agitated and confused this AM. Wife reports pt with poor po intake this AM as pt was agitated. Wife reports pt usually eats well PTA with usual consumption of at least 3 meals a day with an Ensure on occasions. Weight has been stable. RD to order Ensure to aid in caloric and protein needs.   Nutrition-Focused physical exam completed. Findings are moderate fat depletion, moderate muscle depletion, and no edema. Depletion may be related to the natural aging process.   Labs and medications reviewed.   Diet Order:  Diet Heart Room service appropriate?: Yes; Fluid consistency:: Thin  Skin:  Wound (see comment) (wound on L leg, incision on back)  Last BM:  7/2  Height:   Ht Readings from Last 1 Encounters:  08/15/15 5\' 10"  (1.778 m)    Weight:   Wt Readings from Last 1 Encounters:  08/15/15 143 lb 4.8 oz (65 kg)    Ideal Body Weight:  75.45 kg  BMI:  Body mass index is 20.56 kg/(m^2).  Estimated Nutritional Needs:   Kcal:  1750-1850  Protein:  75-85 grams  Fluid:  1.7 - 1.8 L/day  EDUCATION NEEDS:    No education needs identified at this time  Corrin Parker, MS, RD, LDN Pager # [phone] After hours/ weekend pager # [phone]

## 2015-08-16 NOTE — Progress Notes (Signed)
[facility] notified that pt was becoming very agitated HR 140's non sustained atempting to get out of bed New order 0.5 mg IV ativan given will continue to monitor.

## 2015-08-16 NOTE — Progress Notes (Signed)
PROGRESS NOTE    CASETON Davidson  [id number] DOB: 05/21/1925 DOA: 08/15/2015 PCP: Wenda Low, MD  Brief Narrative:  [name] is a 82 y.o. male in relatively good health. Several days ago patient sustained a T12 compression fracture for which he has been taking NSAIDs and pain medications. He underwent T12 kyphoplasty 3 days ago. On that same day patient was found to be in acute renal failure with Cr of 4.35. He was discharged home Friday following the procedure. As instructed, patient laid flat in bed for 24 hours. He ambulated some yesterday but it was painful. During the night patient awoke his wife with complaints of back pain. He had not urinated in nearly two days and last BM was 4-5 days ago. Patient has no previous history of urinary retention  Assessment & Plan:   Acute renal failure . Cr 6.3, up from 4.35 three days ago. Baseline renal function unknown but presumed normal.  -due to urinary retention, h/o enlarged prostate and borderline PSA, followed by Dr.Ottelin -creatinine normalized with foley placement -stop IVF -resume alfuzosin  Hyperkalemia in setting of ARF.  -resolved  Urinary retention,  -now with foley, resume alpha blocker -may need foley at DC  Constipation. Secondary to limited mobility, recent surgical procedure, narcotics. -having Bms after dulcolax, continue miralax PRN  Delirium -due to meds, hospitalization, constipation, urinary retention -afebrile, UA not s/o UTI -exam non focal -Re-orientation, hold sedating meds -haldol PRN for severe agitation only  S/p recent kyphoplasty  -for T12 fracture -hold narcotics due to delirium  DVT prophylaxis: Lovenox  Code Status: Full code Family Communication: Spoke with wife in room, she understands and agrees with plan of treatment..  Disposition Plan: Discharge home in 24-48 hours   Subjective: Confused and agitated since last  pm  Objective: Filed Vitals:   08/15/15 1700 08/15/15 2007 08/16/15 0417 08/16/15 0910  BP: 116/79 128/74 127/81 117/57  Pulse: 90 85 79 109  Temp: 98.3 F (36.8 C) 98.2 F (36.8 C) 97.8 F (36.6 C) 98.1 F (36.7 C)  TempSrc: Oral Oral Oral Oral  Resp: 18 18 18 17   Height:      Weight:  65 kg (143 lb 4.8 oz)    SpO2: 95% 95% 96% 100%    Intake/Output Summary (Last 24 hours) at 08/16/15 0934 Last data filed at 08/16/15 0911  Gross per 24 hour  Intake 1913.75 ml  Output   1850 ml  Net  63.75 ml   Filed Weights   08/15/15 0838 08/15/15 2007  Weight: 62.8 kg (138 lb 7.2 oz) 65 kg (143 lb 4.8 oz)    Examination:  General exam: Appears calm and comfortable  Respiratory system: Clear to auscultation. Respiratory effort normal. Cardiovascular system: S1 & S2 heard, RRR. No JVD, murmurs, rubs, gallops or clicks. No pedal edema. Gastrointestinal system: Abdomen is nondistended, soft and nontender. No organomegaly or masses felt. Normal bowel sounds heard. Central nervous system: Alert and oriented. No focal neurological deficits. Extremities: Symmetric 5 x 5 power. Skin: No rashes, lesions or ulcers Psychiatry: Judgement and insight appear normal. Mood & affect appropriate.     Data Reviewed: I have personally reviewed following labs and imaging studies  CBC:  Recent Labs Lab 08/13/15 1010 08/15/15 0558 08/15/15 0650 08/16/15 0450  WBC 9.4  --  7.8 5.7  NEUTROABS  --   --  6.1  --   HGB 13.3 12.2* 11.9* 12.5*  HCT 40.1 36.0* 35.3* 37.9*  MCV 97.3  --  96.4 96.7  PLT 250  --  244 [id number]   Basic Metabolic Panel:  Recent Labs Lab 08/13/15 1010 08/13/15 1150 08/15/15 0558 08/15/15 0650 08/15/15 1139 08/16/15 0450  NA 137 139 137 139 136 137  K 4.9 4.7 5.2* 4.8 4.6 4.3  CL 100* 103 102 104 107 104  CO2 24 25  --  23 23 26   GLUCOSE 97 94 116* 110* 136* 98  BUN 55* 54* 61* 66* 53* 23*  CREATININE 4.35* 4.39* 6.30* 5.55* 3.63* 0.89  CALCIUM 9.0 8.9  --  8.6*  8.1* 8.5*   GFR: Estimated Creatinine Clearance: 53.8 mL/min (by C-G formula based on Cr of 0.89). Liver Function Tests: No results for input(s): AST, ALT, ALKPHOS, BILITOT, PROT, ALBUMIN in the last 168 hours. No results for input(s): LIPASE, AMYLASE in the last 168 hours. No results for input(s): AMMONIA in the last 168 hours. Coagulation Profile:  Recent Labs Lab 08/13/15 1010  INR 1.24   Cardiac Enzymes: No results for input(s): CKTOTAL, CKMB, CKMBINDEX, TROPONINI in the last 168 hours. BNP (last 3 results) No results for input(s): PROBNP in the last 8760 hours. HbA1C: No results for input(s): HGBA1C in the last 72 hours. CBG: No results for input(s): GLUCAP in the last 168 hours. Lipid Profile: No results for input(s): CHOL, HDL, LDLCALC, TRIG, CHOLHDL, LDLDIRECT in the last 72 hours. Thyroid Function Tests: No results for input(s): TSH, T4TOTAL, FREET4, T3FREE, THYROIDAB in the last 72 hours. Anemia Panel: No results for input(s): VITAMINB12, FOLATE, FERRITIN, TIBC, IRON, RETICCTPCT in the last 72 hours. Urine analysis:    Component Value Date/Time   COLORURINE AMBER* 08/15/2015 0542   APPEARANCEUR CLEAR 08/15/2015 0542   LABSPEC 1.021 08/15/2015 0542   PHURINE 5.5 08/15/2015 0542   GLUCOSEU NEGATIVE 08/15/2015 0542   HGBUR NEGATIVE 08/15/2015 0542   BILIRUBINUR NEGATIVE 08/15/2015 0542   KETONESUR NEGATIVE 08/15/2015 0542   PROTEINUR NEGATIVE 08/15/2015 0542   UROBILINOGEN 0.2 08/03/2007 0928   NITRITE NEGATIVE 08/15/2015 0542   LEUKOCYTESUR NEGATIVE 08/15/2015 0542   Sepsis Labs: @LABRCNTIP (procalcitonin:4,lacticidven:4)  )No results found for this or any previous visit (from the past 240 hour(s)).       Radiology Studies: US Renal  08/15/2015  CLINICAL DATA:  Acute renal failure EXAM: RENAL / URINARY TRACT ULTRASOUND COMPLETE COMPARISON:  None. FINDINGS: Right Kidney: Length: 11.6 cm. Mild increase in cortical echogenicity noted. No focal abnormality.  No hydronephrosis. Left Kidney: Length: 11.9 cm. Increased cortical echogenicity without hydronephrosis. Probable small interpolar nonobstructing stone. Bladder: Decompressed by Foley catheter. IMPRESSION: No hydronephrosis. Increased cortical echogenicity suggests medical renal disease. Electronically Signed   By: Misty Stanley M.D.   On: 08/15/2015 14:45        Scheduled Meds: . alfuzosin  10 mg Oral Q breakfast  . heparin  5,000 Units Subcutaneous Q8H  . latanoprost  1 drop Both Eyes QHS  . polyethylene glycol  17 g Oral Daily  . sodium chloride flush  3 mL Intravenous Q12H   Continuous Infusions:    LOS: 1 day    Time spent: 5min    Domenic Polite, MD [facility] Pager [phone]  If 7PM-7AM, please contact night-coverage [email or web address] Password [facility] 08/16/2015, 9:34 AM

## 2015-08-16 NOTE — Progress Notes (Signed)
Paged Dr. Broadus John in regards to pt's agitation and anxiety. Pt refusing to stay in bed; will not stay in chair either. Pt in chair with chair alarm at nurses' station for safety. Attempted diversion activity, but activities unsuccessful.

## 2015-08-16 NOTE — Progress Notes (Signed)
Pt has been very confused and has not had any sleep all night I was told that tramadol was given during day shift and wife stated that he is oriented and that the behavior he was demonstrating was not the norm for him. Tramadol is now listed as allergy and was discontinued. Tylenol 650 anmg and 25 mg of Trazodone given for pain and sleep not effective. Arthor Captain LPN

## 2015-08-16 NOTE — Care Management Note (Signed)
Case Management Note  Patient Details  Name: [name] MRN: [id number] Date of Birth: 1925-10-02  Subjective/Objective:           CM following for progression and d/c planning.          Action/Plan: 08/16/2015 Met with pt and wife, plan is to d/c to home, most likely would benefit from [facility] and [facility] services. Await PT eval.    Expected Discharge Date:                  Expected Discharge Plan:  [place]  In-House Referral:  NA  Discharge planning Services  CM Consult  Post Acute Care Choice:  NA Choice offered to:     DME Arranged:    DME Agency:     HH Arranged:  RN, PT [place] Agency:     Status of Service:  In process, will continue to follow  If discussed at Long Length of Stay Meetings, dates discussed:    Additional Comments:  Adron Bene, RN 08/16/2015, 1:46 PM

## 2015-08-17 LAB — BASIC METABOLIC PANEL
ANION GAP: 10 (ref 5–15)
BUN: 14 mg/dL (ref 6–20)
CALCIUM: 8.7 mg/dL — AB (ref 8.9–10.3)
CO2: 25 mmol/L (ref 22–32)
Chloride: 102 mmol/L (ref 101–111)
Creatinine, Ser: 0.65 mg/dL (ref 0.61–1.24)
GFR calc non Af Amer: >60 mL/min (ref >60)
Glucose, Bld: 119 mg/dL — ABNORMAL HIGH (ref 65–99)
POTASSIUM: 3.8 mmol/L (ref 3.5–5.1)
Sodium: 137 mmol/L (ref 135–145)

## 2015-08-17 LAB — CBC
HCT: 39.8 % (ref 39.0–52.0)
Hemoglobin: 13.2 g/dL (ref 13.0–17.0)
MCH: 31.9 pg (ref 26.0–34.0)
MCHC: 33.2 g/dL (ref 30.0–36.0)
MCV: 96.1 fL (ref 78.0–100.0)
Platelets: 318 10*3/uL (ref 150–400)
RBC: 4.14 MIL/uL — AB (ref 4.22–5.81)
RDW: 12.9 % (ref 11.5–15.5)
WBC: 7.2 10*3/uL (ref 4.0–10.5)

## 2015-08-17 MED ORDER — METOPROLOL TARTRATE 25 MG PO TABS
25.0000 mg | ORAL_TABLET | Freq: Two times a day (BID) | ORAL | Status: DC
  Administered 2015-08-17 – 2015-08-19 (×5): 25 mg via ORAL
  Filled 2015-08-17 (×5): qty 1

## 2015-08-17 MED ORDER — LORAZEPAM 2 MG/ML IJ SOLN
1.0000 mg | Freq: Four times a day (QID) | INTRAMUSCULAR | Status: DC | PRN
  Administered 2015-08-17 (×2): 1 mg via INTRAVENOUS
  Filled 2015-08-17 (×2): qty 1

## 2015-08-17 NOTE — Progress Notes (Signed)
OT Cancellation Note  Patient Details Name: [name] MRN: [id number] DOB: Jan 31, 1926   Cancelled Treatment:    Reason Eval/Treat Not Completed: Other (comment). Spoke with PT who had just recently finished working with pt and reports that pt had Ativan about 11:30 this AM and is still fairly lethargic from that, advised that tomorrow would probably be a better day to try and see pt.  Almon Register [id number] 08/17/2015, 2:54 PM

## 2015-08-17 NOTE — Evaluation (Signed)
Physical Therapy Evaluation Patient Details Name: [name] MRN: [id number] DOB: 06-25-1925 Today's Date: 08/17/2015   History of Present Illness  [name] is a 82 y.o. male in relatively good health. Several days ago patient sustained a T12 compression fracture for which he has been taking NSAIDs and pain medications. He underwent T12 kyphoplasty 3 days ago. On that same day patient was found to be in acute renal failure with Cr of 4.35. He was discharged home Friday following the procedure. As instructed, patient laid flat in bed for 24 hours. He ambulated some the day after but it was painful.  He had not urinated in nearly two days and last BM was 4-5 days ago. Patient has no previous history of urinary retention  Clinical Impression   Pt admitted with above diagnosis. Pt currently with functional limitations due to the deficits listed below (see PT Problem List). I anticipate a return to functional ambulation and supervision/independent functional level quickly after delirium subsides;   Pt will benefit from skilled PT to increase their independence and safety with mobility to allow discharge to the venue listed below.       Follow Up Recommendations Home health PT;Supervision/Assistance - 24 hour (If delirium is slow to subside, and pt does not progress as quickly as anticipated, we may need to consider SNF for rehab)    Equipment Recommendations  Rolling walker with 5" wheels;3in1 (PT)    Recommendations for Other Services OT consult     Precautions / Restrictions Precautions Precautions: Back Precaution Comments: T12 kyphoplasty approx 3 days ago; per wife, Interventional Radiology MD did not order a brace      Mobility  Bed Mobility Overal bed mobility: Needs Assistance Bed Mobility: Rolling;Sidelying to Sit;Sit to Supine Rolling: Max assist Sidelying to sit: Total assist   Sit to supine: Total assist   General bed mobility comments: Cues for technique; Used  bed pad to cradle hips and facilitate movement; Persistent posterior lean once up sitting EOB, requiring max assist to keep balance; difficulty with hip flexion for upright sitting; put RW  in front of pt for a target to reach for in hopes of bringing trunk anterior, but posterior lean and rigidity in hip extension persisted; Assisted pt back supine with Total assist  Transfers                    Ambulation/Gait                Stairs            Wheelchair Mobility    Modified Rankin (Stroke Patients Only)       Balance Overall balance assessment: Needs assistance   Sitting balance-Leahy Scale: Zero Sitting balance - Comments: persistent posterior lean and trunk and hip extension sitting EOB                                     Pertinent Vitals/Pain Pain Assessment: No/denies pain    Home Living Family/patient expects to be discharged to:: Private residence Living Arrangements: Spouse/significant other Available Help at Discharge: Family;Available 24 hours/day Type of Home: House Home Access: Stairs to enter Entrance Stairs-Rails: Right Entrance Stairs-Number of Steps: 2 Home Layout: One level Home Equipment: None      Prior Function Level of Independence: Independent               Hand Dominance  Extremity/Trunk Assessment   Upper Extremity Assessment: Defer to OT evaluation (Uncoordiated movement)           Lower Extremity Assessment: RLE deficits/detail;LLE deficits/detail RLE Deficits / Details: Under the influence of Ativan during eval; difficulty planning and carrying out motor tasks; requires tactile cues and assist to flex hips and knees in supine with stiffness, rigidity noted LLE Deficits / Details: Under the influence of Ativan during eval; difficulty planning and carrying out motor tasks; requires tactile cues and assist to flex hips and knees in supine with stiffness, rigidity noted     Communication    Communication: Other (comment);Expressive difficulties (Recent dose of Ativan)  Cognition Arousal/Alertness: Lethargic;Suspect due to medications Behavior During Therapy: [facility] for tasks assessed/performed Overall Cognitive Status: Impaired/Different from baseline Area of Impairment: Orientation;Attention;Following commands;Safety/judgement Orientation Level: Disoriented to;Place;Situation Current Attention Level: Sustained Memory: Decreased recall of precautions Following Commands: Follows one step commands inconsistently;Follows one step commands with increased time Safety/Judgement: Decreased awareness of safety;Decreased awareness of deficits     General Comments: Pt would wake up enough to answer questions    General Comments      Exercises        Assessment/Plan    PT Assessment Patient needs continued PT services  PT Diagnosis Difficulty walking;Acute pain   PT Problem List Decreased range of motion;Decreased activity tolerance;Decreased balance;Decreased mobility;Decreased coordination;Decreased cognition;Decreased knowledge of use of DME;Decreased safety awareness;Decreased knowledge of precautions;Pain;Impaired tone  PT Treatment Interventions DME instruction;Gait training;Stair training;Functional mobility training;Therapeutic activities;Therapeutic exercise;Balance training;Cognitive remediation;Patient/family education   PT Goals (Current goals can be found in the Care Plan section) Acute Rehab PT Goals Patient Stated Goal: Unable to state PT Goal Formulation: With family Time For Goal Achievement: 08/31/15 Potential to Achieve Goals: Good    Frequency Min 3X/week   Barriers to discharge Other (comment) Pt's wife is petite, and he would need to be independent, not needing physical assist, in order to DC home    Co-evaluation               End of Session Equipment Utilized During Treatment:  (Bed Pad) Activity Tolerance: Patient limited by  lethargy Patient left: in bed;with call bell/phone within reach;with bed alarm set;with family/visitor present Nurse Communication: Mobility status         Time: [id number] PT Time Calculation (min) (ACUTE ONLY): 30 min   Charges:   PT Evaluation $PT Eval Moderate Complexity: 1 Procedure PT Treatments $Therapeutic Activity: 8-22 mins   PT G Codes:        Quin Hoop 08/17/2015, 3:27 PM  Roney Marion, [place] Pager [phone] Office [phone]

## 2015-08-17 NOTE — Progress Notes (Addendum)
PROGRESS NOTE    [name]  [id number] DOB: 1925/11/22 DOA: 08/15/2015 PCP: Wenda Low, MD  Brief Narrative:  [name] is a 82 y.o. male in relatively good health. Several days ago patient sustained a T12 compression fracture for which he has been taking NSAIDs and pain medications. He underwent T12 kyphoplasty 3 days ago. On that same day patient was found to be in acute renal failure with Cr of 4.35. He was discharged home Friday following the procedure.He had not urinated in nearly two days and last BM was 4-5 days ago. Acute kidney injury resolved with Foley/relief of obstruction, constipation improved as well however hospitalization now complicated by delirium  Assessment & Plan:   Acute renal failure . Cr 6.3, up from 4.35 three days ago. Baseline renal function unknown but presumed normal.  -due to urinary retention, h/o enlarged prostate and borderline PSA, followed by Dr.Ottelin -creatinine normalized with foley placement -stopped IVF -resumed alfuzosin -continue foley, will not do voiding trial today due to confusion/delirium  Hyperkalemia in setting of ARF.  -resolved  Urinary retention,  -now with foley, resume alpha blocker -may need foley at DC  Constipation. Secondary to limited mobility, recent surgical procedure, narcotics. -having Bms after dulcolax, continue miralax PRN  Delirium -due to meds, hospitalization, constipation, urinary retention -afebrile, UA not s/o UTI -exam non focal -Re-orientation, hold sedating meds -very anxious and still and kicking his legs with delirium, will use Ativan PRN instead of Haldol  S/p recent kyphoplasty  -for T12 fracture -hold narcotics due to delirium  DVT prophylaxis: Lovenox  Code Status: Full code Family Communication: wife and son at bedside Disposition Plan: Discharge home when delirium resolves   Subjective: Mentation improved last pm, but  anxious and very agitated this am  Objective: Filed Vitals:   08/16/15 2105 08/16/15 2107 08/17/15 0530 08/17/15 0938  BP:  137/95 138/85 124/77  Pulse:  104 105 99  Temp:  98.3 F (36.8 C) 98.7 F (37.1 C) 98.2 F (36.8 C)  TempSrc:  Oral Oral Oral  Resp:  16 18 18   Height:      Weight: 63.866 kg (140 lb 12.8 oz)     SpO2: 100%  100% 100%    Intake/Output Summary (Last 24 hours) at 08/17/15 1117 Last data filed at 08/17/15 1043  Gross per 24 hour  Intake    600 ml  Output   3000 ml  Net  -2400 ml   Filed Weights   08/15/15 0838 08/15/15 2007 08/16/15 2105  Weight: 62.8 kg (138 lb 7.2 oz) 65 kg (143 lb 4.8 oz) 63.866 kg (140 lb 12.8 oz)    Examination:  General exam: anxious, restless, kicking legs, alert, awake but confused Respiratory system: Clear to auscultation. Respiratory effort normal. Cardiovascular system: S1 & S2 heard, RRR. No JVD, murmurs, rubs, gallops or clicks. No pedal edema. Gastrointestinal system: Abdomen is nondistended, soft and nontender. No organomegaly or masses felt. Normal bowel sounds heard. Central nervous system: Alert and oriented. No focal neurological deficits. Extremities: Symmetric 5 x 5 power. Skin: No rashes, lesions or ulcers Psychiatry: confused    Data Reviewed: I have personally reviewed following labs and imaging studies  CBC:  Recent Labs Lab 08/13/15 1010 08/15/15 0558 08/15/15 0650 08/16/15 0450 08/17/15 0548  WBC 9.4  --  7.8 5.7 7.2  NEUTROABS  --   --  6.1  --   --   HGB 13.3 12.2* 11.9* 12.5* 13.2  HCT 40.1 36.0* 35.3* 37.9*  39.8  MCV 97.3  --  96.4 96.7 96.1  PLT 250  --  244 276 0000000   Basic Metabolic Panel:  Recent Labs Lab 08/13/15 1150 08/15/15 0558 08/15/15 0650 08/15/15 1139 08/16/15 0450 08/17/15 0548  NA 139 137 139 136 137 137  K 4.7 5.2* 4.8 4.6 4.3 3.8  CL 103 102 104 107 104 102  CO2 25  --  23 23 26 25   GLUCOSE 94 116* 110* 136* 98 119*  BUN 54* 61* 66* 53* 23* 14  CREATININE  4.39* 6.30* 5.55* 3.63* 0.89 0.65  CALCIUM 8.9  --  8.6* 8.1* 8.5* 8.7*   GFR: Estimated Creatinine Clearance: 58.8 mL/min (by C-G formula based on Cr of 0.65). Liver Function Tests: No results for input(s): AST, ALT, ALKPHOS, BILITOT, PROT, ALBUMIN in the last 168 hours. No results for input(s): LIPASE, AMYLASE in the last 168 hours. No results for input(s): AMMONIA in the last 168 hours. Coagulation Profile:  Recent Labs Lab 08/13/15 1010  INR 1.24   Cardiac Enzymes: No results for input(s): CKTOTAL, CKMB, CKMBINDEX, TROPONINI in the last 168 hours. BNP (last 3 results) No results for input(s): PROBNP in the last 8760 hours. HbA1C: No results for input(s): HGBA1C in the last 72 hours. CBG: No results for input(s): GLUCAP in the last 168 hours. Lipid Profile: No results for input(s): CHOL, HDL, LDLCALC, TRIG, CHOLHDL, LDLDIRECT in the last 72 hours. Thyroid Function Tests: No results for input(s): TSH, T4TOTAL, FREET4, T3FREE, THYROIDAB in the last 72 hours. Anemia Panel: No results for input(s): VITAMINB12, FOLATE, FERRITIN, TIBC, IRON, RETICCTPCT in the last 72 hours. Urine analysis:    Component Value Date/Time   COLORURINE AMBER* 08/15/2015 0542   APPEARANCEUR CLEAR 08/15/2015 0542   LABSPEC 1.021 08/15/2015 0542   PHURINE 5.5 08/15/2015 0542   GLUCOSEU NEGATIVE 08/15/2015 0542   HGBUR NEGATIVE 08/15/2015 0542   BILIRUBINUR NEGATIVE 08/15/2015 0542   KETONESUR NEGATIVE 08/15/2015 0542   PROTEINUR NEGATIVE 08/15/2015 0542   UROBILINOGEN 0.2 08/03/2007 0928   NITRITE NEGATIVE 08/15/2015 0542   LEUKOCYTESUR NEGATIVE 08/15/2015 0542   Sepsis Labs: @LABRCNTIP (procalcitonin:4,lacticidven:4)  )No results found for this or any previous visit (from the past 240 hour(s)).       Radiology Studies: US Renal  08/15/2015  CLINICAL DATA:  Acute renal failure EXAM: RENAL / URINARY TRACT ULTRASOUND COMPLETE COMPARISON:  None. FINDINGS: Right Kidney: Length: 11.6 cm. Mild  increase in cortical echogenicity noted. No focal abnormality. No hydronephrosis. Left Kidney: Length: 11.9 cm. Increased cortical echogenicity without hydronephrosis. Probable small interpolar nonobstructing stone. Bladder: Decompressed by Foley catheter. IMPRESSION: No hydronephrosis. Increased cortical echogenicity suggests medical renal disease. Electronically Signed   By: Misty Stanley M.D.   On: 08/15/2015 14:45        Scheduled Meds: . alfuzosin  10 mg Oral Q breakfast  . feeding supplement (ENSURE ENLIVE)  237 mL Oral Q1500  . heparin  5,000 Units Subcutaneous Q8H  . latanoprost  1 drop Both Eyes QHS  . metoprolol tartrate  25 mg Oral BID  . polyethylene glycol  17 g Oral Daily  . sodium chloride flush  3 mL Intravenous Q12H   Continuous Infusions:    LOS: 2 days    Time spent: 80min    Domenic Polite, MD [facility] Pager [phone]  If 7PM-7AM, please contact night-coverage [email or web address] Password [email or web address] 08/17/2015, 11:17 AM

## 2015-08-17 NOTE — Progress Notes (Signed)
Met w wife this am. Pt remains agitated w mittens. Wife not sure she will be able to handle if he doesn't improve. Will tent arrange hhc w [facility] and made sw ref in case needs snf. Will cont to follow.

## 2015-08-18 DIAGNOSIS — R339 Retention of urine, unspecified: Secondary | ICD-10-CM

## 2015-08-18 DIAGNOSIS — N179 Acute kidney failure, unspecified: Principal | ICD-10-CM

## 2015-08-18 DIAGNOSIS — K5909 Other constipation: Secondary | ICD-10-CM

## 2015-08-18 DIAGNOSIS — E875 Hyperkalemia: Secondary | ICD-10-CM

## 2015-08-18 MED ORDER — ZOLPIDEM TARTRATE 5 MG PO TABS
2.5000 mg | ORAL_TABLET | Freq: Once | ORAL | Status: AC
  Administered 2015-08-18: 2.5 mg via ORAL
  Filled 2015-08-18: qty 1

## 2015-08-18 NOTE — Clinical Social Work Placement (Addendum)
   CLINICAL SOCIAL WORK PLACEMENT  NOTE  Date:  08/18/2015  Patient Details  Name: [name] MRN: [id number] Date of Birth: [birth year]  Clinical Social Work is seeking post-discharge placement for this patient at the [place] level of care (*CSW will initial, date and re-position this form in  chart as items are completed):  Yes   Patient/family provided with [place] Work Department's list of facilities offering this level of care within the geographic area requested by the patient (or if unable, by the patient's family).  Yes   Patient/family informed of their freedom to choose among providers that offer the needed level of care, that participate in Medicare, Medicaid or managed care program needed by the patient, have an available bed and are willing to accept the patient.  Yes   Patient/family informed of [facility]'s ownership interest in [facility] and [facility], as well as of the fact that they are under no obligation to receive care at these facilities.  PASRR submitted to EDS on 08/18/15     PASRR number received on 08/18/15     Existing PASRR number confirmed on       FL2 transmitted to all facilities in geographic area requested by pt/family on 08/18/15     FL2 transmitted to all facilities within larger geographic area on       Patient informed that his/her managed care company has contracts with or will negotiate with certain facilities, including the following:         08/18/15 - Patient/family informed of bed offers received.  Patient chooses bed at       Physician recommends and patient chooses bed at      Patient to be transferred to   on  .  Patient to be transferred to facility by       Patient family notified on   of transfer.  Name of family member notified:        PHYSICIAN Please sign FL2     Additional Comment:    _______________________________________________ Sable Feil,  LCSW 08/18/2015, 3:32 PM

## 2015-08-18 NOTE — Progress Notes (Signed)
Physical Therapy Treatment Patient Details Name: [name] MRN: [id number] DOB: [birth year] Today's Date: 08/18/2015    History of Present Illness [name] is a 82 y.o. male in relatively good health. Several days ago patient sustained a T12 compression fracture for which he has been taking NSAIDs and pain medications. He underwent T12 kyphoplasty 3 days ago. On that same day patient was found to be in acute renal failure with Cr of 4.35. He was discharged home Friday following the procedure. As instructed, patient laid flat in bed for 24 hours. He ambulated some the day after but it was painful.  He had not urinated in nearly two days and last BM was 4-5 days ago. Patient has no previous history of urinary retention    PT Comments    Much better ability to participate today; Worked on transfers and gait; requires at least mod assist with transfers and gait; At this point I believe we need to look to a short-term SNF stay for [name] to maximize independence and safety with mobility prior to dc home;   Discussed this with pt and wife, [name]; Updated SW as well.   Follow Up Recommendations  SNF     Equipment Recommendations  Rolling walker with 5" wheels;3in1 (PT)    Recommendations for Other Services       Precautions / Restrictions Precautions Precautions: Back;Fall Precaution Comments: T12 kyphoplasty approx 3 days ago; per wife, Interventional Radiology MD did not order a brace Restrictions Weight Bearing Restrictions: No    Mobility  Bed Mobility Overal bed mobility: Needs Assistance Bed Mobility: Rolling;Sidelying to Sit Rolling: Min assist Sidelying to sit: Mod assist       General bed mobility comments: cues for technique; mod assist to elevate trunk to upright  Transfers Overall transfer level: Needs assistance Equipment used: Rolling walker (2 wheeled) Transfers: Sit to/from Stand Sit to Stand: Mod assist;+2 physical assistance Stand pivot  transfers: Mod assist;+2 physical assistance       General transfer comment: Heavy mod assist to power up; max cues to extend knees once standing  Ambulation/Gait Ambulation/Gait assistance: +2 safety/equipment;Mod assist Ambulation Distance (Feet): 50 Feet Assistive device: Rolling walker (2 wheeled) Gait Pattern/deviations: Step-through pattern;Decreased step length - right;Decreased step length - left;Decreased weight shift to right;Decreased weight shift to left;Shuffle (at times resembling festination) Gait velocity: slow   General Gait Details: Multimodal cues for upright posture as pt tends to flex trunk and knees in standing; gentle facilitation of weight shifts onto stance leg to allow for better advancement of swing leg; tending to take short, staccato steps   Stairs            Wheelchair Mobility    Modified Rankin (Stroke Patients Only)       Balance Overall balance assessment: Needs assistance Sitting-balance support: No upper extremity supported;Feet supported Sitting balance-[name] Scale: Fair     Standing balance support: Bilateral upper extremity supported;During functional activity Standing balance-[name] Scale: Poor                      Cognition Arousal/Alertness: Awake/alert Behavior During Therapy: WFL for tasks assessed/performed Overall Cognitive Status: Impaired/Different from baseline Area of Impairment: Orientation;Attention;Following commands;Safety/judgement Orientation Level: Disoriented to;Place;Situation Current Attention Level: Sustained Memory: Decreased recall of precautions Following Commands: Follows one step commands inconsistently;Follows one step commands with increased time Safety/Judgement: Decreased awareness of safety;Decreased awareness of deficits     General Comments: Pt would wake up enough to answer  questions    Exercises      General Comments        Pertinent Vitals/Pain Pain Assessment: Faces Pain  Score: 3  Faces Pain Scale: Hurts a little bit Pain Location: pt did not specify Pain Descriptors / Indicators: Grimacing Pain Intervention(s): Monitored during session    Home Living Family/patient expects to be discharged to:: Private residence Living Arrangements: Spouse/significant other Available Help at Discharge: Family;Available 24 hours/day Type of Home: House Home Access: Stairs to enter Entrance Stairs-Rails: Right Home Layout: One level Home Equipment: None      Prior Function Level of Independence: Independent          PT Goals (current goals can now be found in the care plan section) Acute Rehab PT Goals Patient Stated Goal: get stronger and go home PT Goal Formulation: With family Time For Goal Achievement: 08/31/15 Potential to Achieve Goals: Good Progress towards PT goals: Progressing toward goals    Frequency  Min 3X/week    PT Plan Discharge plan needs to be updated    Co-evaluation             End of Session Equipment Utilized During Treatment: Gait belt Activity Tolerance: Patient tolerated treatment well Patient left: in chair;with call bell/phone within reach;with chair alarm set;with family/visitor present     Time: [id number] PT Time Calculation (min) (ACUTE ONLY): 30 min  Charges:  $Gait Training: 23-37 mins                    G Codes:      Quin Hoop 08/18/2015, 3:01 PM  Roney Marion, [place] Pager [phone] Office [phone]

## 2015-08-18 NOTE — Evaluation (Signed)
Occupational Therapy Evaluation Patient Details Name: [name] MRN: [id number] DOB: February 16, 1925 Today's Date: 08/18/2015    History of Present Illness [name] is a 82 y.o. male in relatively good health. Several days ago patient sustained a T12 compression fracture for which he has been taking NSAIDs and pain medications. He underwent T12 kyphoplasty 3 days ago. On that same day patient was found to be in acute renal failure with Cr of 4.35. He was discharged home Friday following the procedure. As instructed, patient laid flat in bed for 24 hours. He ambulated some the day after but it was painful.  He had not urinated in nearly two days and last BM was 4-5 days ago. Patient has no previous history of urinary retention   Clinical Impression   Pt with decline in function and safety with ADLs and ADL mobility with decreased, strength, balance, endurance and cognition. Pt disoriented to place, time and situation but alert, talkative and pleasant. Pt would benefit from acute OT services to address impairments to increase level of function and safety    Follow Up Recommendations  Home health OT;Supervision/Assistance - 24 hour    Equipment Recommendations  Tub/shower bench;3 in 1 bedside comode    Recommendations for Other Services       Precautions / Restrictions Precautions Precautions: Back;Fall Precaution Comments: T12 kyphoplasty approx 3 days ago; per wife, Interventional Radiology MD did not order a brace Restrictions Weight Bearing Restrictions: No      Mobility Bed Mobility Overal bed mobility: Needs Assistance Bed Mobility: Rolling;Sidelying to Sit;Sit to Supine Rolling: Mod assist Sidelying to sit: Max assist       General bed mobility comments: cues for technique  Transfers Overall transfer level: Needs assistance Equipment used: Rolling walker (2 wheeled) Transfers: Sit to/from [facility] Sit to Stand: Mod assist;+2 physical  assistance Stand pivot transfers: Mod assist;+2 physical assistance            Balance Overall balance assessment: Needs assistance Sitting-balance support: No upper extremity supported;Feet supported Sitting balance-Leahy Scale: Fair     Standing balance support: Bilateral upper extremity supported;During functional activity Standing balance-Leahy Scale: Poor                              ADL Overall ADL's : Needs assistance/impaired     Grooming: Wash/dry hands;Wash/dry face;Sitting;Minimal assistance   Upper Body Bathing: Minimal assitance;Sitting   Lower Body Bathing: Maximal assistance   Upper Body Dressing : Minimal assistance;Sitting   Lower Body Dressing: Total assistance   Toilet Transfer: +2 for physical assistance;BSC;RW;Cueing for safety;Cueing for sequencing;Moderate assistance   Toileting- Clothing Manipulation and Hygiene: Total assistance       Functional mobility during ADLs: Moderate assistance;Cueing for safety;Cueing for sequencing;+2 for physical assistance       Vision  wears glasses, no change from baseline              Pertinent Vitals/Pain Pain Assessment: Faces Pain Score: 3  Pain Location: back Pain Descriptors / Indicators: Sore Pain Intervention(s): Monitored during session;Premedicated before session;Repositioned     Hand Dominance Right   Extremity/Trunk Assessment Upper Extremity Assessment Upper Extremity Assessment: Generalized weakness   Lower Extremity Assessment Lower Extremity Assessment: Defer to PT evaluation       Communication Communication Communication: HOH   Cognition Arousal/Alertness: Awake/alert Behavior During Therapy: WFL for tasks assessed/performed Overall Cognitive Status: Impaired/Different from baseline Area of Impairment: Orientation;Attention;Following commands;Safety/judgement  Orientation Level: Disoriented to;Place;Situation Current Attention Level: Sustained Memory:  Decreased recall of precautions Following Commands: Follows one step commands inconsistently;Follows one step commands with increased time Safety/Judgement: Decreased awareness of safety;Decreased awareness of deficits         General Comments   pt very cooperative, pleasant. Pt's wife very supportive and pleasant                 Home Living Family/patient expects to be discharged to:: Private residence Living Arrangements: Spouse/significant other Available Help at Discharge: Family;Available 24 hours/day Type of Home: House Home Access: Stairs to enter [employer] of Steps: 2 Entrance Stairs-Rails: Right Home Layout: One level     Bathroom Shower/Tub: Teacher, early years/pre: Standard     Home Equipment: None          Prior Functioning/Environment Level of Independence: Independent             OT Diagnosis: Generalized weakness;Acute pain   OT Problem List: Pain;Decreased safety awareness;Impaired balance (sitting and/or standing);Decreased activity tolerance;Decreased knowledge of precautions;Decreased knowledge of use of DME or AE;Decreased strength   OT Treatment/Interventions: Self-care/ADL training;Patient/family education;Therapeutic activities;DME and/or AE instruction    OT Goals(Current goals can be found in the care plan section) Acute Rehab OT Goals Patient Stated Goal: get stronger and go home OT Goal Formulation: With patient/family Time For Goal Achievement: 08/25/15 Potential to Achieve Goals: Good ADL Goals Pt Will Perform Grooming: with min guard assist;sitting;with caregiver independent in assisting Pt Will Perform Upper Body Bathing: with min guard assist;sitting Pt Will Perform Lower Body Bathing: with mod assist;with caregiver independent in assisting;sitting/lateral leans Pt Will Perform Upper Body Dressing: with min assist;sitting Pt Will Perform Lower Body Dressing: with max assist;with mod assist;with  caregiver independent in assisting Pt Will Transfer to Toilet: with mod assist;with min assist;bedside commode Pt Will Perform Toileting - Clothing Manipulation and hygiene: with max assist;with mod assist;with caregiver independent in assisting Pt Will Perform Tub/Shower Transfer: with mod assist;with min assist;tub bench;with caregiver independent in assisting  OT Frequency: Min 2X/week   Barriers to D/C:    uncertain at this time if pt will need more that [facility] (may need SNF if confusion/delirium persists)                     End of Session Equipment Utilized During Treatment: Gait belt;Rolling walker Nurse Communication: Mobility status  Activity Tolerance: Patient tolerated treatment well Patient left: in chair;with call bell/phone within reach;with chair alarm set;with family/visitor present   Time: [id number] OT Time Calculation (min): 26 min Charges:  OT General Charges $OT Visit: 1 Procedure OT Evaluation $OT Eval Moderate Complexity: 1 Procedure OT Treatments $Therapeutic Activity: 8-22 mins G-Codes:    Britt Bottom 08/18/2015, 11:55 AM

## 2015-08-18 NOTE — Clinical Social Work Note (Signed)
Clinical Social Work Assessment  Patient Details  Name: [name] MRN: [id number] Date of Birth: [birth year]  Date of referral:  08/17/15               Reason for consult:  Facility Placement                Permission sought to share information with:  Other (Wife was at the bedside with patient and participated in the conversation ) Permission granted to share information::  No (Patient and wife participated in conversation regarding d/c planning. Mr. Hanzlik allowed his wife to take the lead in conversation.)  Name::     Zakry Bonebrake  Agency::     Relationship::  Wife  Contact Information:  [phone] - home #  Housing/Transportation Living arrangements for the past 2 months:  Single Family Home Source of Information:  Patient, Spouse Patient Interpreter Needed:  None Criminal Activity/Legal Involvement Pertinent to Current Situation/Hospitalization:  No - Comment as needed Significant Relationships:  Adult Children, Spouse (Patient has 4 children and his wife has 1 adult child.) Lives with:  Spouse Do you feel safe going back to the place where you live?  Yes (Wife expressed agreement that rehab will benefit patient prior to returning home) Need for family participation in patient care:  Yes (Comment)  Care giving concerns:  Wife in agreement with ST rehab for patient versus discharge home as she is primary caregiver.   Social Worker assessment / plan:  CSW talked with patient and wife regarding recommendation of ST rehab. Mr. Hildebrandt was sitting in a chair at bedside and was awake, alert, and greeted CSW, but was mostly quiet during the conversation. Mrs. Rodarte was receptive to talking with CSW and an active participate in the dialogue. Per Mrs. Pomykala, patient has not been to a skilled facility for ST rehab before. CSW talked with wife regarding ST rehab, explained the facility search process, provided her with a list of skilled facilities for [facility] and answered her  questions.  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 Frager provided with skilled facility list for [facility])  Patient/Family's Response to care:  No concerns expressed regarding care during hospitalization.  Patient/Family's Understanding of and Emotional Response to Diagnosis, Current Treatment, and Prognosis:  Not discussed.  Emotional Assessment Appearance:  Appears stated age Attitude/Demeanor/Rapport:  Other (Appropriate) Affect (typically observed):  Appropriate, Quiet, Calm Orientation:  Oriented to Self Alcohol / Substance use:  Never Used Psych involvement (Current and /or in the community):  No (Comment)  Discharge Needs  Concerns to be addressed:  Discharge Planning Concerns Readmission within the last 30 days:  No Current discharge risk:  None Barriers to Discharge:  No Barriers Identified   Sable Feil, LCSW 08/18/2015, 3:19 PM

## 2015-08-18 NOTE — Care Management Important Message (Signed)
Important Message  Patient Details  Name: [name] MRN: [id number] Date of Birth: 1925-03-27   Medicare Important Message Given:  Yes    [name] 08/18/2015, 11:39 AM

## 2015-08-18 NOTE — NC FL2 (Signed)
[place] LEVEL OF CARE SCREENING TOOL     IDENTIFICATION  Patient Name: [name] Birthdate: Aug 04, 1925 Sex: male Admission Date (Current Location): 08/15/2015  [facility] and [place] Number:  Herbalist and Address:  The [place], [place] [address] [place], [place] [address]      Provider Number: [id number]  Attending Physician Name and Address:  Robbie Lis, MD  Relative Name and Phone Number:  Treyshaun Hasbun - wife.  Phone [phone]    Current Level of Care: Hospital Recommended Level of Care: [place] Prior Approval Number:    Date Approved/Denied:   PASRR Number: [id number] (Ef. 08/18/15)  Discharge Plan: SNF    Current Diagnoses: Patient Active Problem List   Diagnosis Date Noted  . AKI (acute kidney injury) ([place]) 08/15/2015  . Acute renal failure ([place]) 08/15/2015  . Constipation 08/15/2015  . Urinary retention 08/15/2015  . Hyperkalemia 08/15/2015    Orientation RESPIRATION BLADDER Height & Weight     Self  Normal Continent (Patient has foley catheter) Weight: 130 lb 1.1 oz (59 kg) Height:  5\' 10"  (177.8 cm)  BEHAVIORAL SYMPTOMS/MOOD NEUROLOGICAL BOWEL NUTRITION STATUS      Continent Diet (Heart healthy)  AMBULATORY STATUS COMMUNICATION OF NEEDS Skin   Extensive Assist (Patient did not ambulate during PT eval on 7/4) Verbally Skin abrasions (Abrasion left lower leg and Incision mid-back.)                       Personal Care Assistance Level of Assistance  Bathing, Feeding, Dressing Bathing Assistance: Maximum assistance Feeding assistance: Independent (Help with set-up) Dressing Assistance: Maximum assistance     Functional Limitations Info  Sight, Hearing, Speech Sight Info: Adequate Hearing Info: Adequate Speech Info: Adequate    SPECIAL CARE FACTORS FREQUENCY  PT (By licensed PT)     PT Frequency: Evaluated 7/4 and a minimum of 3X per week recommended               Contractures Contractures Info: Not present    Additional Factors Info  Code Status, Allergies Code Status Info: Full Allergies Info: Ciprofloxacin, Sulfa Antibiotics           Current Medications (08/18/2015):  This is the current hospital active medication list Current Facility-Administered Medications  Medication Dose Route Frequency Provider Last Rate Last Dose  . acetaminophen (TYLENOL) tablet 650 mg  650 mg Oral Q6H PRN Willia Craze, NP   650 mg at 08/15/15 2356   Or  . acetaminophen (TYLENOL) suppository 650 mg  650 mg Rectal Q6H PRN Willia Craze, NP      . alfuzosin (UROXATRAL) 24 hr tablet 10 mg  10 mg Oral Q breakfast Domenic Polite, MD   10 mg at 08/18/15 0817  . feeding supplement (ENSURE ENLIVE) (ENSURE ENLIVE) liquid 237 mL  237 mL Oral Q1500 Domenic Polite, MD   237 mL at 08/16/15 1650  . heparin injection 5,000 Units  5,000 Units Subcutaneous Q8H Willia Craze, NP   5,000 Units at 08/18/15 0631  . latanoprost (XALATAN) 0.005 % ophthalmic solution 1 drop  1 drop Both Eyes QHS Willia Craze, NP   1 drop at 08/17/15 2321  . LORazepam (ATIVAN) injection 1 mg  1 mg Intravenous Q6H PRN Domenic Polite, MD   1 mg at 08/17/15 2313  . metoprolol tartrate (LOPRESSOR) tablet 25 mg  25 mg Oral BID Domenic Polite, MD  25 mg at 08/18/15 1018  . ondansetron (ZOFRAN) tablet 4 mg  4 mg Oral Q6H PRN Willia Craze, NP       Or  . ondansetron [facility]) injection 4 mg  4 mg Intravenous Q6H PRN Willia Craze, NP      . polyethylene glycol (MIRALAX / GLYCOLAX) packet 17 g  17 g Oral Daily Willia Craze, NP   17 g at 08/18/15 1019  . sodium chloride flush (NS) 0.9 % injection 3 mL  3 mL Intravenous Q12H Willia Craze, NP   3 mL at 08/18/15 1019     Discharge Medications: Please see discharge summary for a list of discharge medications.  Relevant Imaging Results:  Relevant Lab Results:   Additional Information ss# [id number].  Sable Feil,  LCSW

## 2015-08-18 NOTE — Progress Notes (Signed)
Patient ID: [name], male   DOB: [birth year], 82 y.o.   MRN: [id number]  PROGRESS NOTE    [name]  [id number] DOB: 1925-10-11 DOA: 08/15/2015  PCP: Wenda Low, MD   Brief Narrative:  82 year old male in relatively good state of health who presented to [facility] with urinary retention. He recently had a T12 compression fracture and underwent kyphoplasty for that. He has been taking NSAIDs and pain medications for pain relief. He did not urinate since discharge home after kyphoplasty for 3 days. On admission, he was found to be in acute renal failure with creatinine of 6.3. His hospital course is complicated with delirium.   Assessment & Plan:  Acute renal failure in the setting of acute urinary retention - Creatinine was 6.3 on this admission. Creatinine was 4.35 on 08/13/2015 - Acute elevation in creatinine is likely due to urinary retention - Patient does have Foley catheter and his creatinine subsequently normalized  Acute delirium - Likely because of pain medications and hospitalization as well as urinary retention - He has intermittent agitation for which reason he has mittens on - He has been seen by physical therapy who recommends home health PT but this may change depending on how patient does with subsequent evaluation  Hyperkalemia - Likely due to acute renal failure - Subsequently improved  Constipation - Secondary to limited mobility, recent surgical procedure, narcotics. - Had one bowel movement in past 24 hours  S/p recent kyphoplasty  - For T12 fracture - Hold narcotics due to delirium   DVT prophylaxis: Heparin subcutaneous Code Status: full code  Family Communication: Patient's son at the bedside Disposition Plan: Home or skilled nursing facility likely 08/19/2015   Consultants:   Physical therapy  Procedures:   None  Antimicrobials:   None   Subjective: Apparently intermittently agitated for which reason  he has mittens on.  Objective: Filed Vitals:   08/17/15 1658 08/17/15 2034 08/17/15 2329 08/18/15 0457  BP: 135/62 128/88 130/74 137/82  Pulse: 96 107 110 94  Temp: 98.7 F (37.1 C) 98.6 F (37 C)  98.8 F (37.1 C)  TempSrc: Oral     Resp: 18 18  21   Height:      Weight:  59 kg (130 lb 1.1 oz)    SpO2: 100% 94%  94%    Intake/Output Summary (Last 24 hours) at 08/18/15 0903 Last data filed at 08/18/15 0457  Gross per 24 hour  Intake    600 ml  Output   2175 ml  Net  -1575 ml   Filed Weights   08/15/15 2007 08/16/15 2105 08/17/15 2034  Weight: 65 kg (143 lb 4.8 oz) 63.866 kg (140 lb 12.8 oz) 59 kg (130 lb 1.1 oz)    Examination:  General exam: Appears calm and comfortable; has mittens on Respiratory system: Clear to auscultation. Respiratory effort normal. Cardiovascular system: S1 & S2 heard, RRR. Gastrointestinal system: Abdomen is nondistended, soft and nontender. No organomegaly or masses felt. Normal bowel sounds heard. Central nervous system: No focal neurological deficits. Extremities: Symmetric 5 x 5 power. Skin: No rashes, lesions or ulcers Psychiatry: Mood & affect appropriate.   Data Reviewed: I have personally reviewed following labs and imaging studies  CBC:  Recent Labs Lab 08/13/15 1010 08/15/15 0558 08/15/15 0650 08/16/15 0450 08/17/15 0548  WBC 9.4  --  7.8 5.7 7.2  NEUTROABS  --   --  6.1  --   --   HGB 13.3 12.2* 11.9* 12.5* 13.2  HCT 40.1 36.0* 35.3* 37.9* 39.8  MCV 97.3  --  96.4 96.7 96.1  PLT 250  --  244 276 0000000   Basic Metabolic Panel:  Recent Labs Lab 08/13/15 1150 08/15/15 0558 08/15/15 0650 08/15/15 1139 08/16/15 0450 08/17/15 0548  NA 139 137 139 136 137 137  K 4.7 5.2* 4.8 4.6 4.3 3.8  CL 103 102 104 107 104 102  CO2 25  --  23 23 26 25   GLUCOSE 94 116* 110* 136* 98 119*  BUN 54* 61* 66* 53* 23* 14  CREATININE 4.39* 6.30* 5.55* 3.63* 0.89 0.65  CALCIUM 8.9  --  8.6* 8.1* 8.5* 8.7*   GFR: Estimated Creatinine  Clearance: 54.3 mL/min (by C-G formula based on Cr of 0.65). Liver Function Tests: No results for input(s): AST, ALT, ALKPHOS, BILITOT, PROT, ALBUMIN in the last 168 hours. No results for input(s): LIPASE, AMYLASE in the last 168 hours. No results for input(s): AMMONIA in the last 168 hours. Coagulation Profile:  Recent Labs Lab 08/13/15 1010  INR 1.24   Cardiac Enzymes: No results for input(s): CKTOTAL, CKMB, CKMBINDEX, TROPONINI in the last 168 hours. BNP (last 3 results) No results for input(s): PROBNP in the last 8760 hours. HbA1C: No results for input(s): HGBA1C in the last 72 hours. CBG: No results for input(s): GLUCAP in the last 168 hours. Lipid Profile: No results for input(s): CHOL, HDL, LDLCALC, TRIG, CHOLHDL, LDLDIRECT in the last 72 hours. Thyroid Function Tests: No results for input(s): TSH, T4TOTAL, FREET4, T3FREE, THYROIDAB in the last 72 hours. Anemia Panel: No results for input(s): VITAMINB12, FOLATE, FERRITIN, TIBC, IRON, RETICCTPCT in the last 72 hours. Urine analysis:    Component Value Date/Time   COLORURINE AMBER* 08/15/2015 0542   APPEARANCEUR CLEAR 08/15/2015 0542   LABSPEC 1.021 08/15/2015 0542   PHURINE 5.5 08/15/2015 0542   GLUCOSEU NEGATIVE 08/15/2015 0542   HGBUR NEGATIVE 08/15/2015 0542   BILIRUBINUR NEGATIVE 08/15/2015 0542   KETONESUR NEGATIVE 08/15/2015 0542   PROTEINUR NEGATIVE 08/15/2015 0542   UROBILINOGEN 0.2 08/03/2007 0928   NITRITE NEGATIVE 08/15/2015 0542   LEUKOCYTESUR NEGATIVE 08/15/2015 0542   Sepsis Labs: @LABRCNTIP (procalcitonin:4,lacticidven:4)   )No results found for this or any previous visit (from the past 240 hour(s)).    Radiology Studies: US Renal 08/15/2015   No hydronephrosis. Increased cortical echogenicity suggests medical renal disease. Electronically Signed   By: Misty Stanley M.D.   On: 08/15/2015 14:45      Scheduled Meds: . alfuzosin  10 mg Oral Q breakfast  . feeding supplement (ENSURE ENLIVE)  237  mL Oral Q1500  . heparin  5,000 Units Subcutaneous Q8H  . latanoprost  1 drop Both Eyes QHS  . metoprolol tartrate  25 mg Oral BID  . polyethylene glycol  17 g Oral Daily  . sodium chloride flush  3 mL Intravenous Q12H   Continuous Infusions:    LOS: 3 days    Time spent: 25 minutes  Greater than 50% of the time spent on counseling and coordinating the care.   Leisa Lenz, MD [facility] Pager [phone]  If 7PM-7AM, please contact night-coverage [email or web address] Password [email or web address] 08/18/2015, 9:03 AM

## 2015-08-19 DIAGNOSIS — R2689 Other abnormalities of gait and mobility: Secondary | ICD-10-CM | POA: Diagnosis not present

## 2015-08-19 DIAGNOSIS — S0990XA Unspecified injury of head, initial encounter: Secondary | ICD-10-CM | POA: Diagnosis not present

## 2015-08-19 DIAGNOSIS — K5900 Constipation, unspecified: Secondary | ICD-10-CM | POA: Diagnosis not present

## 2015-08-19 DIAGNOSIS — S50311D Abrasion of right elbow, subsequent encounter: Secondary | ICD-10-CM | POA: Diagnosis not present

## 2015-08-19 DIAGNOSIS — Y939 Activity, unspecified: Secondary | ICD-10-CM | POA: Diagnosis not present

## 2015-08-19 DIAGNOSIS — T148 Other injury of unspecified body region: Secondary | ICD-10-CM | POA: Diagnosis not present

## 2015-08-19 DIAGNOSIS — M6281 Muscle weakness (generalized): Secondary | ICD-10-CM | POA: Diagnosis not present

## 2015-08-19 DIAGNOSIS — Y999 Unspecified external cause status: Secondary | ICD-10-CM | POA: Diagnosis not present

## 2015-08-19 DIAGNOSIS — S0093XA Contusion of unspecified part of head, initial encounter: Secondary | ICD-10-CM | POA: Diagnosis not present

## 2015-08-19 DIAGNOSIS — S22080A Wedge compression fracture of T11-T12 vertebra, initial encounter for closed fracture: Secondary | ICD-10-CM | POA: Diagnosis not present

## 2015-08-19 DIAGNOSIS — R262 Difficulty in walking, not elsewhere classified: Secondary | ICD-10-CM | POA: Diagnosis not present

## 2015-08-19 DIAGNOSIS — N178 Other acute kidney failure: Secondary | ICD-10-CM | POA: Diagnosis not present

## 2015-08-19 DIAGNOSIS — N401 Enlarged prostate with lower urinary tract symptoms: Secondary | ICD-10-CM | POA: Diagnosis not present

## 2015-08-19 DIAGNOSIS — E875 Hyperkalemia: Secondary | ICD-10-CM | POA: Diagnosis not present

## 2015-08-19 DIAGNOSIS — S22080D Wedge compression fracture of T11-T12 vertebra, subsequent encounter for fracture with routine healing: Secondary | ICD-10-CM | POA: Diagnosis not present

## 2015-08-19 DIAGNOSIS — S5002XA Contusion of left elbow, initial encounter: Secondary | ICD-10-CM | POA: Diagnosis not present

## 2015-08-19 DIAGNOSIS — G8929 Other chronic pain: Secondary | ICD-10-CM | POA: Diagnosis not present

## 2015-08-19 DIAGNOSIS — S32020D Wedge compression fracture of second lumbar vertebra, subsequent encounter for fracture with routine healing: Secondary | ICD-10-CM | POA: Diagnosis not present

## 2015-08-19 DIAGNOSIS — M542 Cervicalgia: Secondary | ICD-10-CM | POA: Diagnosis not present

## 2015-08-19 DIAGNOSIS — I10 Essential (primary) hypertension: Secondary | ICD-10-CM | POA: Diagnosis not present

## 2015-08-19 DIAGNOSIS — N179 Acute kidney failure, unspecified: Secondary | ICD-10-CM | POA: Diagnosis not present

## 2015-08-19 DIAGNOSIS — R278 Other lack of coordination: Secondary | ICD-10-CM | POA: Diagnosis not present

## 2015-08-19 DIAGNOSIS — R339 Retention of urine, unspecified: Secondary | ICD-10-CM | POA: Diagnosis not present

## 2015-08-19 DIAGNOSIS — R079 Chest pain, unspecified: Secondary | ICD-10-CM | POA: Diagnosis not present

## 2015-08-19 DIAGNOSIS — N189 Chronic kidney disease, unspecified: Secondary | ICD-10-CM | POA: Diagnosis not present

## 2015-08-19 DIAGNOSIS — N3289 Other specified disorders of bladder: Secondary | ICD-10-CM | POA: Diagnosis not present

## 2015-08-19 DIAGNOSIS — R259 Unspecified abnormal involuntary movements: Secondary | ICD-10-CM | POA: Diagnosis not present

## 2015-08-19 DIAGNOSIS — H40059 Ocular hypertension, unspecified eye: Secondary | ICD-10-CM | POA: Diagnosis not present

## 2015-08-19 DIAGNOSIS — R410 Disorientation, unspecified: Secondary | ICD-10-CM | POA: Diagnosis not present

## 2015-08-19 DIAGNOSIS — S0003XD Contusion of scalp, subsequent encounter: Secondary | ICD-10-CM | POA: Diagnosis not present

## 2015-08-19 DIAGNOSIS — E569 Vitamin deficiency, unspecified: Secondary | ICD-10-CM | POA: Diagnosis not present

## 2015-08-19 DIAGNOSIS — K5909 Other constipation: Secondary | ICD-10-CM | POA: Diagnosis not present

## 2015-08-19 DIAGNOSIS — S5001XA Contusion of right elbow, initial encounter: Secondary | ICD-10-CM | POA: Diagnosis not present

## 2015-08-19 DIAGNOSIS — S199XXA Unspecified injury of neck, initial encounter: Secondary | ICD-10-CM | POA: Diagnosis not present

## 2015-08-19 DIAGNOSIS — H409 Unspecified glaucoma: Secondary | ICD-10-CM | POA: Diagnosis not present

## 2015-08-19 DIAGNOSIS — W228XXA Striking against or struck by other objects, initial encounter: Secondary | ICD-10-CM | POA: Diagnosis not present

## 2015-08-19 DIAGNOSIS — R338 Other retention of urine: Secondary | ICD-10-CM | POA: Diagnosis not present

## 2015-08-19 DIAGNOSIS — S0003XA Contusion of scalp, initial encounter: Secondary | ICD-10-CM | POA: Diagnosis not present

## 2015-08-19 DIAGNOSIS — Y92129 Unspecified place in nursing home as the place of occurrence of the external cause: Secondary | ICD-10-CM | POA: Diagnosis not present

## 2015-08-19 DIAGNOSIS — S299XXA Unspecified injury of thorax, initial encounter: Secondary | ICD-10-CM | POA: Diagnosis not present

## 2015-08-19 MED ORDER — ACETAMINOPHEN 325 MG PO TABS: 650.0000 mg | ORAL_TABLET | Freq: Four times a day (QID) | ORAL | Status: DC | PRN

## 2015-08-19 MED ORDER — METOPROLOL TARTRATE 25 MG PO TABS: 25.0000 mg | ORAL_TABLET | Freq: Two times a day (BID) | ORAL | Status: DC

## 2015-08-19 MED ORDER — ENSURE ENLIVE PO LIQD: 237.0000 mL | Freq: Every day | ORAL | Status: AC

## 2015-08-19 NOTE — Progress Notes (Signed)
Occupational Therapy Treatment Patient Details Name: [name] MRN: [id number] DOB: 1925-07-28 Today's Date: 08/19/2015    History of present illness [name] is a 82 y.o. male admitted 08/15/15 due to increased pain following  a  T12 kyphoplasty on 08/13/15. Patient has  had  urinary retention since procedure. He was found to be in acute renal failure.   OT comments  Pt making progress with functional goals. Pt continues to requires extensive assist with ADLs and has decreased balance for safe functional mobility. Pt with cognitive impairments/decreased safety awareness. Pt able to ambulate to bathroom with RW for toileting. Now recommend that pt d/c to SNF for ST rehab for safest option to increase level of function and safety before returning home. OT will continue to follow acutely  Follow Up Recommendations  SNF;Supervision/Assistance - 24 hour    Equipment Recommendations  Other (comment) (TBD at next level of care)    Recommendations for Other Services      Precautions / Restrictions Precautions Precautions: Fall Precaution Comments: T12 kyphoplasty 6/30 Restrictions Weight Bearing Restrictions: No       Mobility Bed Mobility   Bed Mobility: Rolling;Sidelying to Sit Rolling: Mod assist Sidelying to sit: Mod assist;Max assist;HOB elevated       General bed mobility comments: pt up in recliner  Transfers Overall transfer level: Needs assistance Equipment used: Rolling walker (2 wheeled) Transfers: Sit to/from Stand Sit to Stand: Mod assist;Max assist (mod A from toilet/3 in 1, max A from recliner)              Balance Overall balance assessment: Needs assistance Sitting-balance support: No upper extremity supported;Feet supported Sitting balance-Leahy Scale: Fair Sitting balance - Comments: initially Left lean, tactile cues to  find midline. Postural control: Right lateral lean;Posterior lean Standing balance support: Bilateral upper extremity  supported;During functional activity Standing balance-Leahy Scale: Poor                     ADL       Grooming: Wash/dry hands;Wash/dry face;Min guard;Standing;Cueing for safety   Upper Body Bathing: Min guard;Sitting   Lower Body Bathing: Moderate assistance;Sitting/lateral leans;Sit to/from stand   Upper Body Dressing : Min guard;Sitting   Lower Body Dressing: Total assistance Lower Body Dressing Details (indicate cue type and reason): total A to donn socks Toilet Transfer: RW;Cueing for safety;Cueing for sequencing;Moderate assistance;Comfort height toilet;Grab bars;Ambulation   Toileting- Clothing Manipulation and Hygiene: Sit to/from stand;Maximal assistance       Functional mobility during ADLs: Moderate assistance;Cueing for safety;Cueing for sequencing;Maximal assistance        Vision  no change from baseline                              Cognition   Behavior During Therapy: [facility] for tasks assessed/performed Overall Cognitive Status: Impaired/Different from baseline Area of Impairment: Memory;Safety/judgement Orientation Level: Time;Situation Current Attention Level: Sustained Memory: Decreased recall of precautions  Following Commands: Follows one step commands inconsistently;Follows one step commands with increased time            Extremity/Trunk Assessment   generalized weakness                        General Comments  pt very pleasant and cooperative, more alert but still some confusion    Pertinent Vitals/ Pain       Pain Assessment: No/denies pain Faces  Pain Scale: No hurt Pain Location: did not appear to have back pain.   Pain Intervention(s): Monitored during session  Home Living  lives at home with wife                                        Prior Functioning/Environment  independent, was driving           Frequency Min 2X/week     Progress Toward Goals  OT Goals(current goals can  now be found in the care plan section)  Progress towards OT goals: Progressing toward goals     Plan Discharge plan remains appropriate    End of Session Equipment Utilized During Treatment: Gait belt;Rolling walker;Other (comment)   Activity Tolerance Patient tolerated treatment well   Patient Left in chair;with call bell/phone within reach;with chair alarm set;with family/visitor present   Nurse Communication Mobility status        Time: 0939-1006 OT Time Calculation (min): 27 min  Charges: OT General Charges $OT Visit: 1 Procedure OT Treatments $Self Care/Home Management : 8-22 mins $Therapeutic Activity: 8-22 mins  Britt Bottom 08/19/2015, 11:36 AM

## 2015-08-19 NOTE — Discharge Instructions (Signed)
Acute Urinary Retention, Male  Acute urinary retention is when you are unable to pee (urinate). Acute urinary retention is common in older men. Prostates can get bigger, which blocks the flow of pee.   HOME CARE  · Drink enough fluids to keep your pee clear or pale yellow.  · If you are sent home with a tube that drains the bladder (catheter), there will be a drainage bag attached to it. There are two types of bags. One is big that you can wear at night without having to empty it. One is smaller and needs to be emptied more often.    Keep the drainage bag empty.    Keep the drainage bag lower than your catheter.  · Only take medicine as told by your doctor.  GET HELP IF:  · You have a low-grade fever.  · You have spasms or you are leaking pee when you have spasms.  GET HELP RIGHT AWAY IF:   · You have chills or a fever.  · Your catheter stops draining pee.  · Your catheter falls out.  · You have increased bleeding that does not stop after you have rested and increased the amount of fluids you had been drinking.  MAKE SURE YOU:   · Understand these instructions.  · Will watch your condition.  · Will get help right away if you are not doing well or get worse.     This information is not intended to replace advice given to you by your health care provider. Make sure you discuss any questions you have with your health care provider.     Document Released: 07/19/2007 Document Revised: 06/16/2014 Document Reviewed: 07/11/2012  Elsevier Interactive Patient Education ©2016 [employer]

## 2015-08-19 NOTE — Progress Notes (Addendum)
Patient Discharge: Disposition: Patient discharged to [facility]. Given report to the nurse Lattie Haw  answered all the questions. IV: Discontinued before discharge, site clear and dry no discharge noted. Telemetry: Discontinued before discharge, CCMD notified. Transportation: Patient transported via EMS. Belongings: Patient took all his belongings with him.

## 2015-08-19 NOTE — Discharge Summary (Signed)
Physician Discharge Summary  QI GIGNAC [id number] DOB: January 25, 1926 DOA: 08/15/2015  PCP: Wenda Low, MD  Admit date: 08/15/2015 Discharge date: 08/19/2015  Recommendations for Outpatient Follow-up:  1. Hold narcotics as it made pt delirious.   Discharge Diagnoses:  Principal Problem:   Acute renal failure ([place]) Active Problems:   AKI (acute kidney injury) ([place])   Constipation   Urinary retention   Hyperkalemia    Discharge Condition: stable   Diet recommendation: as tolerated   History of present illness:  82 year old male in relatively good state of health who presented to [facility] with urinary retention. He recently had a T12 compression fracture and underwent kyphoplasty for that. He has been taking NSAIDs and pain medications for pain relief. He did not urinate since discharge home after kyphoplasty for 3 days. On admission, he was found to be in acute renal failure with creatinine of 6.3. His hospital course was complicated with delirium.  Hospital Course:   Assessment & Plan:  Acute renal failure in the setting of acute urinary retention - Creatinine was 6.3 on this admission. Creatinine was 4.35 on 08/13/2015 - Acute elevation in creatinine likely due to urinary retention - Patient does have Foley catheter and his creatinine subsequently normalized - Pulled foley out today   Acute delirium - Likely because of pain medications and hospitalization as well as urinary retention - Much better mental status - No mittens on, eating breakfast unassisted   Hyperkalemia - Likely due to acute renal failure - Subsequently improved  Constipation - Secondary to limited mobility, recent surgical procedure, narcotics. - Had 3 BM 7/5  S/p recent kyphoplasty  - For T12 fracture - Holding narcotics due to side effect of delirium   DVT prophylaxis: Heparin subcutaneous Code Status: full code  Family Communication: Patient's wife at the  bedside    Consultants:   Physical therapy  Procedures:   None  Antimicrobials:   None   Signed:  Leisa Lenz, MD  [facility] 08/19/2015, 11:48 AM  Pager #: [phone]  Time spent in minutes: more than 30 minutes   Discharge Exam: Filed Vitals:   08/19/15 0810 08/19/15 1000  BP: 106/72 122/70  Pulse: 84   Temp: 98.6 F (37 C)   Resp: 18    Filed Vitals:   08/18/15 2025 08/19/15 0534 08/19/15 0810 08/19/15 1000  BP: 108/62 113/69 106/72 122/70  Pulse: 87 80 84   Temp: 99.3 F (37.4 C) 99.1 F (37.3 C) 98.6 F (37 C)   TempSrc:   Oral   Resp: 18 18 18    Height:      Weight:      SpO2: 94% 94% 95%     General: Pt is alert, follows commands appropriately, not in acute distress Cardiovascular: Regular rate and rhythm, S1/S2 + Respiratory: Clear to auscultation bilaterally, no wheezing, no crackles, no rhonchi Abdominal: Soft, non tender, non distended, bowel sounds +, no guarding Extremities: no cyanosis, pulses palpable bilaterally DP and PT Neuro: Grossly nonfocal  Discharge Instructions  Discharge Instructions    Call MD for:  difficulty breathing, headache or visual disturbances    Complete by:  As directed      Call MD for:  persistant dizziness or light-headedness    Complete by:  As directed      Call MD for:  persistant nausea and vomiting    Complete by:  As directed      Call MD for:  severe uncontrolled pain    Complete by:  As directed      Diet - low sodium heart healthy    Complete by:  As directed      Increase activity slowly    Complete by:  As directed             Medication List    STOP taking these medications        Eszopiclone 3 MG Tabs     HYDROcodone-acetaminophen 5-325 MG tablet  Commonly known as:  NORCO/VICODIN      TAKE these medications        acetaminophen 325 MG tablet  Commonly known as:  TYLENOL  Take 2 tablets (650 mg total) by mouth every 6 (six) hours as needed for mild pain (or Fever  >/= 101).     ALEVE 220 MG tablet  Generic drug:  naproxen sodium  Take 220 mg by mouth every 8 (eight) hours as needed (For pain.).     alfuzosin 10 MG 24 hr tablet  Commonly known as:  UROXATRAL  Take 10 mg by mouth daily after supper.     feeding supplement (ENSURE ENLIVE) Liqd  Take 237 mLs by mouth daily at 3 pm.     metoprolol tartrate 25 MG tablet  Commonly known as:  LOPRESSOR  Take 1 tablet (25 mg total) by mouth 2 (two) times daily.     multivitamin with minerals Tabs tablet  Take 1 tablet by mouth daily.     SYSTANE OVERNIGHT THERAPY 0.3 % Gel ophthalmic ointment  Generic drug:  hypromellose  Place 1 application into the left eye at bedtime.     TRAVATAN Z 0.004 % Soln ophthalmic solution  Generic drug:  Travoprost (BAK Free)  Place 1 drop into both eyes at bedtime.           Follow-up Information    Follow up with Wenda Low, MD. Schedule an appointment as soon as possible for a visit in 1 week.   Specialty:  Internal Medicine   Why:  Follow up appt after recent hospitalization   Contact information:   [address] [place] [address] [phone]        The results of significant diagnostics from this hospitalization (including imaging, microbiology, ancillary and laboratory) are listed below for reference.    Significant Diagnostic Studies: Mr Lumbar Spine Wo Contrast  08/04/2015  ADDENDUM REPORT: 08/04/2015 11:13 ADDENDUM: Mild intra- and extrahepatic biliary dilatation and pancreatic ductal dilatation, incompletely visualized. Suggest laboratory correlation and consideration of MRCP for further evaluation. Electronically Signed   By: Logan Bores M.D.   On: 08/04/2015 11:13  08/04/2015  CLINICAL DATA:  Acute onset of severe low back pain after lifting an object 3 days ago. Compression fracture on radiographs. Remote history of lumbar surgery 30 years ago. EXAM: MRI LUMBAR SPINE WITHOUT CONTRAST TECHNIQUE: Multiplanar,  multisequence MR imaging of the lumbar spine was performed. No intravenous contrast was administered. COMPARISON:  Lumbar spine radiographs 08/03/2015 FINDINGS: Segmentation: Normal segmentation demonstrated on comparison radiographs. Alignment: Slight left convex curvature of the lumbar spine. No significant listhesis. Vertebrae: Mild T12 superior endplate fracture as seen on radiographs with approximately 10% vertebral body height loss and mild marrow edema subjacent to the superior endplate. No retropulsion or posterior element edema. No evidence of compression fracture in the lumbar spine. A few small Schmorl's nodes are noted with very mild edema along the L2 and L3 inferior endplates. Conus medullaris: Extends to the L1 level and appears normal. Paraspinal and other  soft tissues: Mild extrahepatic biliary dilatation to 11 mm in diameter with minimal intrahepatic biliary dilatation and mild pancreatic ductal dilatation, incompletely visualized. Small renal sinus cysts. 5 mm indeterminate T2 hypo intense lesion in the interpolar right kidney. Disc levels: T11-12 and T12-L1:  No disc herniation or stenosis. L1-2: Mild disc bulging and mild facet arthrosis without significant stenosis. L2-3: Disc bulging asymmetric to the right, congenitally short pedicles, and mild facet and ligamentum flavum hypertrophy result in moderate spinal stenosis, moderate right and mild left lateral recess stenosis, and mild-to-moderate right and mild left neural foraminal stenosis. L3-4: Circumferential disc bulging, congenitally short pedicles, and mild facet and ligamentum flavum hypertrophy result in mild spinal stenosis, mild right greater than left lateral recess stenosis, and mild right neural foraminal stenosis. L4-5: Prior left laminotomy. Mild disc bulging and mild facet arthrosis result in mild right neural foraminal stenosis without spinal stenosis. L5-S1: Mild disc bulging, asymmetric left-sided disc space height loss, and  left foraminal endplate spurring result in mild left neural foraminal narrowing and minimal left lateral recess narrowing. No spinal stenosis. IMPRESSION: 1. Acute, mild T12 compression fracture. 2. Multilevel lumbar disc and facet degeneration, most notable at L2-3 where there is moderate spinal stenosis and right greater than left lateral recess and neural foraminal stenosis. 3. Mild spinal stenosis at L3-4. Electronically Signed: By: Logan Bores M.D. On: 08/03/2015 20:45   US Renal  08/15/2015  CLINICAL DATA:  Acute renal failure EXAM: RENAL / URINARY TRACT ULTRASOUND COMPLETE COMPARISON:  None. FINDINGS: Right Kidney: Length: 11.6 cm. Mild increase in cortical echogenicity noted. No focal abnormality. No hydronephrosis. Left Kidney: Length: 11.9 cm. Increased cortical echogenicity without hydronephrosis. Probable small interpolar nonobstructing stone. Bladder: Decompressed by Foley catheter. IMPRESSION: No hydronephrosis. Increased cortical echogenicity suggests medical renal disease. Electronically Signed   By: Misty Stanley M.D.   On: 08/15/2015 14:45   Ir Kypho Thoracic With Bone Biopsy  08/19/2015  INDICATION: Severe low back pain secondary to compression fracture at T12. EXAM: IR KYPHO VERTEBRAL THORACIC AUGMENTATION COMPARISON:  MRI of the L-spine on 08/03/2015. MEDICATIONS: As antibiotic prophylaxis, 2 g Ancef IV was ordered pre-procedure and administered intravenously within 1 hour of incision. ANESTHESIA/SEDATION: Moderate (conscious) sedation was employed during this procedure. A total of Versed 1 mg and Fentanyl 25 mcg was administered intravenously. Moderate Sedation Time: 25 minutes. The patient's level of consciousness and vital signs were monitored continuously by radiology nursing throughout the procedure under my direct supervision. FLUOROSCOPY TIME:  Fluoroscopy Time: 13.9 minutes 0 seconds ([id number]) COMPLICATIONS: None immediate. PROCEDURE: Following a full explanation of the procedure  along with the potential associated complications, an informed witnessed consent was obtained. The patient was placed prone on the fluoroscopic table. The skin overlying the thoracolumbar region was then prepped and draped in the usual sterile fashion. The right pedicle at T12 was then infiltrated with 0.25% bupivacaine followed by the advancement of an 11-gauge Jamshidi needle through the right pedicle into the posterior one-third at T12. This was then exchanged for a Kyphon advanced osteo introducer system comprised of a working cannula and a Kyphon osteo drill. This combination was then advanced over a Kyphon osteo bone pin until the tip of the Kyphon osteo drill was in the posterior third at T12. At this time, the bone pin was removed. In a medial trajectory, the combination was advanced until the tip of the working cannula was inside the posterior one-third at T12. The osteo drill was removed and a core sample  sent for pathologic analysis. Through the working cannula, a Kyphon bone biopsy device was advanced to within 5 mm of the anterior aspect of T12. A core sample from this was also sent for pathologic analysis. Through the working cannula, a Kyphon inflatable bone tamp 20 x 3 was advanced and positioned with the distal marker 5 mm from the anterior aspect of T12. Crossing of the midline was seen on the AP projection. At this time, the balloon was expanded using contrast via a Kyphon inflation syringe device via microtubing. Inflations were continued until there was apposition with the superior and the inferior endplates. At this time, methylmethacrylate mixture was reconstituted with Tobramycin in the Kyphon bone mixing device system. This was then loaded onto the Kyphon bone fillers. The balloon was deflated and removed followed by the instillation of 6 bone filler equivalents of methylmethacrylate mixture at T12 with excellent filling in the AP and lateral projections. No extravasation was noted in the  disk spaces or posteriorly into the spinal canal. No epidural venous contamination was seen. The working cannula and the bone filler were then retrieved and removed. Hemostasis was achieved at the skin entry site. Patient tolerated the procedure well. IMPRESSION: 1. Status post fluoroscopic-guided needle placement for deep core bone biopsy at T12. 2. Status post vertebral body augmentation using balloon kyphoplasty at T12 as described without event. Electronically Signed   By: Luanne Bras M.D.   On: 08/13/2015 15:15   Ir Radiologist Eval & Mgmt  08/11/2015  EXAM: NEW PATIENT OFFICE VISIT CHIEF COMPLAINT: Severe low back pain secondary to compression fracture at T12. Current Pain Level: 1-10 HISTORY OF PRESENT ILLNESS: The patient is an 82 year old, gentleman referred for evaluation of pain relief due to a compression fracture at T12. The patient is accompanied by his wife. According to both of them, the patient apparently developed severe low back pain after having twisted his back a few days ago while lifting a box. The patient later that evening experienced significant discomfort to the point where the patient was unable to stand. Since that time, the patient had severe low back pain which he describes a 10 out of 10 most prominent when attempting to stand up from a sitting position or sitting up from a lying position. Unable to stand for long periods of time because of the pain. There is a marginal circumferential radiation of pain in the lower lumbar region. There is no radicular radiation of pain in the lower extremities. He denies any autonomic dysfunction of his bowel or bladder. He does have urgency incontinence related to his previous prostate difficulties. The patient denies any recent chills, fever or rigors. He denies any dysuria, or hematuria or polyuria. His appetite remains normal. Weight initially slightly decreased but now maintained according to his wife. He is ambulatory with a cane. He  is unable to drive because of the severe pain. Also patient is unable to sleep without interruption at night because of pain. The patient does have relief of his pain with Aleve. He tried taking hydrocodone which produced significant constipation with minimal relief of pain as per his wife. Prior to this episode, the patient was fully ambulatory, independent and able to drive and involve with daily chores. At the present time the patient complains he is in misery because of the pain. Past Medical History: Previous history of cataract surgery, Baker's cyst removal, testicular removal, deviated septum and herniated disc. Medications: Aspirin. Flonase. Multivitamins. Uroxatral. Aleve. Lunesta. Dulcolax. Senokot. Travatan. Allergies:  Sulfa  and Cipro apparently cause rash. Social History: Married, four children alive and well. Does not smoke or drink alcohol. Denies using illicit chemicals. Family History: Significant for daughter with asthma. REVIEW OF SYSTEMS: Essentially negative for pathologic symptomatology except for as mentioned above. PHYSICAL EXAMINATION: He is in moderate distress on account of severe low back pain. Affect otherwise appropriate. Neurologically no lateralizing features. Patient exquisitely tender in the the thoracolumbar region on deep palpation. ASSESSMENT AND PLAN: Patient's recent MRI scan of the thoracolumbar spine was reviewed. This depicts compression fracture at T12 with signal abnormalities depicting this to be recent. Approximately 20% loss of vertebral body height at the superior endplate region. Minimal retropulsion noted. Also noted a moderate spinal canal stenosis at L2-L3 on a multifactorial basis with moderate lateral recess stenoses bilaterally extending into the neural foramina. The option of vertebral body augmentation for pain relief and to prevent further collapse was discussed with the patient and patient's wife. The procedure for balloon kyphoplasty, the risks, benefits  and alternatives were all reviewed in detail. Questions were answered to their satisfaction. The patient would like to proceed with vertebral body augmentation at T12 for pain relief and prevention of further collapse. The procedure will be scheduled as soon as possible. In the meantime, patient has been advised to avoid stooping, bending or lifting more than 10 pounds. He also been advised to continue using his cane for ambulation. The patient and his wife were asked to call should they have any concerns or questions. Electronically Signed   By: Luanne Bras M.D.   On: 08/10/2015 12:28    Microbiology: No results found for this or any previous visit (from the past 240 hour(s)).   Labs: Basic Metabolic Panel:  Recent Labs Lab 08/13/15 1150 08/15/15 0558 08/15/15 0650 08/15/15 1139 08/16/15 0450 08/17/15 0548  NA 139 137 139 136 137 137  K 4.7 5.2* 4.8 4.6 4.3 3.8  CL 103 102 104 107 104 102  CO2 25  --  23 23 26 25   GLUCOSE 94 116* 110* 136* 98 119*  BUN 54* 61* 66* 53* 23* 14  CREATININE 4.39* 6.30* 5.55* 3.63* 0.89 0.65  CALCIUM 8.9  --  8.6* 8.1* 8.5* 8.7*   Liver Function Tests: No results for input(s): AST, ALT, ALKPHOS, BILITOT, PROT, ALBUMIN in the last 168 hours. No results for input(s): LIPASE, AMYLASE in the last 168 hours. No results for input(s): AMMONIA in the last 168 hours. CBC:  Recent Labs Lab 08/13/15 1010 08/15/15 0558 08/15/15 0650 08/16/15 0450 08/17/15 0548  WBC 9.4  --  7.8 5.7 7.2  NEUTROABS  --   --  6.1  --   --   HGB 13.3 12.2* 11.9* 12.5* 13.2  HCT 40.1 36.0* 35.3* 37.9* 39.8  MCV 97.3  --  96.4 96.7 96.1  PLT 250  --  244 276 318   Cardiac Enzymes: No results for input(s): CKTOTAL, CKMB, CKMBINDEX, TROPONINI in the last 168 hours. BNP: BNP (last 3 results) No results for input(s): BNP in the last 8760 hours.  ProBNP (last 3 results) No results for input(s): PROBNP in the last 8760 hours.  CBG: No results for input(s): GLUCAP  in the last 168 hours.

## 2015-08-19 NOTE — Clinical Social Work Placement (Signed)
   CLINICAL SOCIAL WORK PLACEMENT  NOTE 08/19/15 - DISCHARGED TO [facility]) SKILLED NURSING FACILITY  Date:  08/19/2015  Patient Details  Name: [name] MRN: [id number] Date of Birth: 1926-02-08  Clinical Social Work is seeking post-discharge placement for this patient at the [place] level of care (*CSW will initial, date and re-position this form in  chart as items are completed):  Yes   Patient/family provided with [facility] Work Department's list of facilities offering this level of care within the geographic area requested by the patient (or if unable, by the patient's family).  Yes   Patient/family informed of their freedom to choose among providers that offer the needed level of care, that participate in Medicare, Medicaid or managed care program needed by the patient, have an available bed and are willing to accept the patient.  Yes   Patient/family informed of [facility]'s ownership interest in [facility] and [facility], as well as of the fact that they are under no obligation to receive care at these facilities.  PASRR submitted to EDS on 08/18/15     PASRR number received on 08/18/15     Existing PASRR number confirmed on       FL2 transmitted to all facilities in geographic area requested by pt/family on 08/18/15     FL2 transmitted to all facilities within larger geographic area on       Patient informed that his/her managed care company has contracts with or will negotiate with certain facilities, including the following:         08/18/15 - Patient/family informed of bed offers received.  Patient chooses bed at  [facility])     Physician recommends and patient chooses bed at      Patient to be transferred to  [facility] on  08/19/15.  Patient to be transferred to facility by  ambulance     Patient family notified on  08/19/15 of transfer.  Name of family member notified:   Wife Hinton Dyer at the bedside      PHYSICIAN Please sign FL2     Additional Comment:    _______________________________________________ Sable Feil, LCSW 08/19/2015, 12:14 PM

## 2015-08-19 NOTE — Progress Notes (Signed)
Physical Therapy Treatment Patient Details Name: [name] MRN: [id number] DOB: 03-14-1925 Today's Date: 08/19/2015    History of Present Illness [name] is a 82 y.o. male admitted 08/15/15 due to increased pain following  a  T12 kyphoplasty on 08/13/15. Patient has  had  urinary retention since procedure. He was found to be in acute renal failure.    PT Comments    The patient was sound asleep. Aroused  With stimulation.Marland Kitchen Required assistance for bed mobility. Gait  Improved with increased distance and more erect inside the RW.  The patient will benefit from post acute rehab facility.  Follow Up Recommendations  SNF;Supervision/Assistance - 24 hour     Equipment Recommendations  3in1 (PT);None recommended by PT    Recommendations for Other Services       Precautions / Restrictions Precautions Precautions: Fall Precaution Comments: T12 kyphoplasty 6/30 Restrictions Weight Bearing Restrictions: No    Mobility  Bed Mobility   Bed Mobility: Rolling;Sidelying to Sit Rolling: Mod assist Sidelying to sit: Mod assist;Max assist;HOB elevated       General bed mobility comments: multimodal cues for rolling. assist  to get the trunk upright, patient struggled to self assist. HOB raised                                     Transfers Overall transfer level: Needs assistance Equipment used: Rolling walker (2 wheeled) Transfers: Sit to/from Stand Sit to Stand: Mod assist;From elevated surface            Ambulation/Gait Ambulation/Gait assistance: Min assist;Mod assist Ambulation Distance (Feet): 100 Feet Assistive device: Rolling walker (2 wheeled)       General Gait Details: Multimodal cues for upright posture, improved gait  posture, mod assist to initiate ambulation, assist with turns and to keep in line. min assist once patient was going with ambulation.   Stairs            Wheelchair Mobility    Modified Rankin (Stroke Patients Only)        Balance Overall balance assessment: Needs assistance Sitting-balance support: No upper extremity supported;Feet supported Sitting balance-Leahy Scale: Fair Sitting balance - Comments: initially Left lean, tactile cues to  find midline. Postural control: Right lateral lean;Posterior lean   Standing balance-Leahy Scale: Poor                      Cognition Arousal/Alertness: Awake/alert Behavior During Therapy: WFL for tasks assessed/performed Overall Cognitive Status: Impaired/Different from baseline Area of Impairment: Memory;Safety/judgement Orientation Level: Time;Situation Current Attention Level: Sustained Memory: Decreased recall of precautions              Exercises      General Comments        Pertinent Vitals/Pain Pain Assessment: Faces Faces Pain Scale: No hurt Pain Location: did not appear to have back pain.   Pain Intervention(s): Monitored during session    Home Living                      Prior Function            PT Goals (current goals can now be found in the care plan section) Progress towards PT goals: Progressing toward goals    Frequency  Min 3X/week    PT Plan Current plan remains appropriate    Co-evaluation  End of Session Equipment Utilized During Treatment: Gait belt Activity Tolerance: Patient tolerated treatment well Patient left: in chair;with call bell/phone within reach;with chair alarm set     Time: 0842-0900 PT Time Calculation (min) (ACUTE ONLY): 18 min  Charges:  $Gait Training: 8-22 mins                    G Codes:      Claretha Cooper 08/19/2015, 9:50 AM Tresa Endo PT [phone]

## 2015-08-20 DIAGNOSIS — N3289 Other specified disorders of bladder: Secondary | ICD-10-CM | POA: Diagnosis not present

## 2015-08-20 DIAGNOSIS — S32020D Wedge compression fracture of second lumbar vertebra, subsequent encounter for fracture with routine healing: Secondary | ICD-10-CM | POA: Diagnosis not present

## 2015-08-20 DIAGNOSIS — N178 Other acute kidney failure: Secondary | ICD-10-CM | POA: Diagnosis not present

## 2015-08-21 ENCOUNTER — Encounter (HOSPITAL_COMMUNITY): Payer: Self-pay | Admitting: Emergency Medicine

## 2015-08-21 ENCOUNTER — Emergency Department (HOSPITAL_COMMUNITY): Payer: Medicare Other

## 2015-08-21 ENCOUNTER — Emergency Department (HOSPITAL_COMMUNITY)
Admission: EM | Admit: 2015-08-21 | Discharge: 2015-08-21 | Disposition: A | Discharge status: Home / Self Care | Payer: Medicare Other | Attending: Emergency Medicine | Admitting: Emergency Medicine

## 2015-08-21 DIAGNOSIS — S199XXA Unspecified injury of neck, initial encounter: Secondary | ICD-10-CM | POA: Diagnosis not present

## 2015-08-21 DIAGNOSIS — S5001XA Contusion of right elbow, initial encounter: Secondary | ICD-10-CM | POA: Diagnosis not present

## 2015-08-21 DIAGNOSIS — W228XXA Striking against or struck by other objects, initial encounter: Secondary | ICD-10-CM | POA: Insufficient documentation

## 2015-08-21 DIAGNOSIS — Y92129 Unspecified place in nursing home as the place of occurrence of the external cause: Secondary | ICD-10-CM | POA: Insufficient documentation

## 2015-08-21 DIAGNOSIS — S0990XA Unspecified injury of head, initial encounter: Secondary | ICD-10-CM | POA: Diagnosis not present

## 2015-08-21 DIAGNOSIS — S299XXA Unspecified injury of thorax, initial encounter: Secondary | ICD-10-CM | POA: Diagnosis not present

## 2015-08-21 DIAGNOSIS — S0003XD Contusion of scalp, subsequent encounter: Secondary | ICD-10-CM | POA: Diagnosis not present

## 2015-08-21 DIAGNOSIS — R339 Retention of urine, unspecified: Secondary | ICD-10-CM | POA: Insufficient documentation

## 2015-08-21 DIAGNOSIS — Y939 Activity, unspecified: Secondary | ICD-10-CM | POA: Insufficient documentation

## 2015-08-21 DIAGNOSIS — S0003XA Contusion of scalp, initial encounter: Secondary | ICD-10-CM | POA: Diagnosis not present

## 2015-08-21 DIAGNOSIS — N3289 Other specified disorders of bladder: Secondary | ICD-10-CM | POA: Diagnosis not present

## 2015-08-21 DIAGNOSIS — Y999 Unspecified external cause status: Secondary | ICD-10-CM | POA: Insufficient documentation

## 2015-08-21 DIAGNOSIS — S5002XA Contusion of left elbow, initial encounter: Secondary | ICD-10-CM | POA: Diagnosis not present

## 2015-08-21 DIAGNOSIS — M542 Cervicalgia: Secondary | ICD-10-CM | POA: Diagnosis not present

## 2015-08-21 DIAGNOSIS — W19XXXA Unspecified fall, initial encounter: Secondary | ICD-10-CM

## 2015-08-21 DIAGNOSIS — S50311D Abrasion of right elbow, subsequent encounter: Secondary | ICD-10-CM | POA: Diagnosis not present

## 2015-08-21 DIAGNOSIS — R079 Chest pain, unspecified: Secondary | ICD-10-CM | POA: Diagnosis not present

## 2015-08-21 LAB — URINALYSIS, ROUTINE W REFLEX MICROSCOPIC
BILIRUBIN URINE: NEGATIVE
Glucose, UA: NEGATIVE mg/dL
HGB URINE DIPSTICK: NEGATIVE
KETONES UR: NEGATIVE mg/dL
Leukocytes, UA: NEGATIVE
Nitrite: NEGATIVE
PROTEIN: NEGATIVE mg/dL
Specific Gravity, Urine: 1.024 (ref 1.005–1.030)
pH: 6.5 (ref 5.0–8.0)

## 2015-08-21 LAB — BASIC METABOLIC PANEL
ANION GAP: 8 (ref 5–15)
BUN: 31 mg/dL — AB (ref 6–20)
CO2: 26 mmol/L (ref 22–32)
Calcium: 8.8 mg/dL — ABNORMAL LOW (ref 8.9–10.3)
Chloride: 103 mmol/L (ref 101–111)
Creatinine, Ser: 0.92 mg/dL (ref 0.61–1.24)
GFR calc Af Amer: >60 mL/min (ref >60)
Glucose, Bld: 115 mg/dL — ABNORMAL HIGH (ref 65–99)
POTASSIUM: 3.8 mmol/L (ref 3.5–5.1)
SODIUM: 137 mmol/L (ref 135–145)

## 2015-08-21 LAB — CBC
HCT: 39.9 % (ref 39.0–52.0)
Hemoglobin: 13.2 g/dL (ref 13.0–17.0)
MCH: 32.1 pg (ref 26.0–34.0)
MCHC: 33.1 g/dL (ref 30.0–36.0)
MCV: 97.1 fL (ref 78.0–100.0)
Platelets: 373 10*3/uL (ref 150–400)
RBC: 4.11 MIL/uL — AB (ref 4.22–5.81)
RDW: 13.2 % (ref 11.5–15.5)
WBC: 11.8 10*3/uL — AB (ref 4.0–10.5)

## 2015-08-21 NOTE — ED Notes (Signed)
Per EMS, pt from the [facility] with c/o fall about 7 hrs ago. Per nursing home staff, patient suddenly became confused. Upon arrival pt, A&Ox4. BP-130/90. Denies pain.

## 2015-08-21 NOTE — ED Provider Notes (Signed)
CSN: [id number]     Arrival date & time 08/21/15  [id number] History  By signing my name below, I, Evelene Croon, attest that this documentation has been prepared under the direction and in the presence of Jola Schmidt, MD . Electronically Signed: Evelene Croon, Scribe. 08/21/2015. 1:51 AM.    Chief Complaint  Patient presents with  . Fall    The history is provided by the spouse and the patient. No language interpreter was used.     HPI Comments:  [name] is a 82 y.o. male who presents to the Emergency Department from the [facility] s/p fall ~7 hours ago. Wife states pt injured the back of his head and right elbow during fall. Pt had back surgery on 08/13/15. He was then seen in the ED on 08/14/15 for urinary retention and abdominal pain; pt had cath placed and it was removed on 08/17/15/ Pt was then placed in rehab facility. Pt denies neck pain and back pain. No alleviating factors noted.  Deveshwar- Surgeon   Past Medical History  Diagnosis Date  . Enlarged prostate    Past Surgical History  Procedure Laterality Date  . Cataract extraction    . Kyphoplasty    . Carpal tunnel release Bilateral   . Abdominal hernia repair    . Back surgery     Family History  Problem Relation Age of Onset  . Heart attack     Social History  Substance Use Topics  . Smoking status: Never Smoker   . Smokeless tobacco: None  . Alcohol Use: No    Review of Systems 10 systems reviewed and all are negative for acute change except as noted in the HPI.   Allergies  Ciprofloxacin; Sulfa antibiotics; and Tramadol  Home Medications   Prior to Admission medications   Medication Sig Start Date End Date Taking? Authorizing Provider  acetaminophen (TYLENOL) 325 MG tablet Take 2 tablets (650 mg total) by mouth every 6 (six) hours as needed for mild pain (or Fever >/= 101). 08/19/15   Robbie Lis, MD  alfuzosin (UROXATRAL) 10 MG 24 hr tablet Take 10 mg by mouth daily after supper.    Historical  Provider, MD  feeding supplement, ENSURE ENLIVE, (ENSURE ENLIVE) LIQD Take 237 mLs by mouth daily at 3 pm. 08/19/15   Robbie Lis, MD  hypromellose (SYSTANE OVERNIGHT THERAPY) 0.3 % GEL ophthalmic ointment Place 1 application into the left eye at bedtime.    Historical Provider, MD  metoprolol tartrate (LOPRESSOR) 25 MG tablet Take 1 tablet (25 mg total) by mouth 2 (two) times daily. 08/19/15   Robbie Lis, MD  Multiple Vitamin (MULTIVITAMIN WITH MINERALS) TABS tablet Take 1 tablet by mouth daily.    Historical Provider, MD  naproxen sodium (ALEVE) 220 MG tablet Take 220 mg by mouth every 8 (eight) hours as needed (For pain.).    Historical Provider, MD  TRAVATAN Z 0.004 % SOLN ophthalmic solution Place 1 drop into both eyes at bedtime. 05/05/15   Historical Provider, MD   BP 124/73 mmHg  Pulse 83  Temp(Src) 98.3 F (36.8 C) (Oral)  Resp 18  SpO2 95% Physical Exam  Constitutional: He appears well-developed and well-nourished.  HENT:  Head: Normocephalic.  Hematoma to posterior scalp; no laceration   Eyes: EOM are normal.  Neck: Normal range of motion.  Cardiovascular: Normal rate, regular rhythm, normal heart sounds and intact distal pulses.   Pulmonary/Chest: Effort normal and breath sounds normal. No respiratory distress.  Abdominal: Soft. He exhibits no distension. There is no tenderness.  Musculoskeletal: Normal range of motion.  Nml ROM bilateral shoulder and wrists  FROM bilateral hips and knees   Neurological: He is alert.  Skin: Skin is warm and dry.  Small skin tear right posterior elbow without laceration; nml ROM right elbow   Psychiatric: He has a normal mood and affect. Judgment normal.  Nursing note and vitals reviewed.   ED Course  Procedures   DIAGNOSTIC STUDIES:  Oxygen Saturation is 96% on RA, normal by my interpretation.    COORDINATION OF CARE:  1:44 AM Discussed treatment plan with pt at bedside and pt agreed to plan.  Labs Review Labs Reviewed  CBC  - Abnormal; Notable for the following:    WBC 11.8 (*)    RBC 4.11 (*)    All other components within normal limits  BASIC METABOLIC PANEL - Abnormal; Notable for the following:    Glucose, Bld 115 (*)    BUN 31 (*)    Calcium 8.8 (*)    All other components within normal limits  URINALYSIS, ROUTINE W REFLEX MICROSCOPIC (NOT AT [facility]) - Abnormal; Notable for the following:    Color, Urine AMBER (*)    All other components within normal limits  URINE CULTURE    Imaging Review Dg Chest 2 View  08/21/2015  CLINICAL DATA:  Pain, fall, hematoma to right elbow. EXAM: CHEST  2 VIEW COMPARISON:  No recent exam.  Radiograph 07/12/2006 reviewed. FINDINGS: Increase cardiomegaly from prior. There is tortuosity and atherosclerosis of the thoracic aorta. Small bilateral pleural effusions, left greater than right. Mild vascular congestion without overt pulmonary edema. No pneumothorax. Diffuse bony under mineralization. Kyphoplasty within lower thoracic vertebra. No acute osseous abnormality is seen. IMPRESSION: Cardiomegaly with small pleural effusions and vascular congestion. Recommend correlation for fluid overload. Thoracic aortic atherosclerosis. No definite acute traumatic process. Electronically Signed   By: Jeb Levering M.D.   On: 08/21/2015 02:39   Dg Elbow Complete Right  08/21/2015  CLINICAL DATA:  83 year old with right elbow pain after fall. Hematoma. EXAM: RIGHT ELBOW - COMPLETE 3+ VIEW COMPARISON:  None. FINDINGS: No acute fracture or dislocation. No elbow joint effusion. Mild enthesopathic change at the flexor and extensor tendon insertions. Soft tissue prominence posteriorly. IMPRESSION: No acute fracture or subluxation of the right elbow. Electronically Signed   By: Jeb Levering M.D.   On: 08/21/2015 02:37   Ct Head Wo Contrast  08/21/2015  CLINICAL DATA:  [place] at nursing home approximately 7 hours ago. Hit back of head. Neck pain. Disoriented. EXAM: CT HEAD WITHOUT CONTRAST CT CERVICAL  SPINE WITHOUT CONTRAST TECHNIQUE: Multidetector CT imaging of the head and cervical spine was performed following the standard protocol without intravenous contrast. Multiplanar CT image reconstructions of the cervical spine were also generated. COMPARISON:  None. FINDINGS: CT HEAD FINDINGS INTRACRANIAL CONTENTS: The ventricles and sulci are normal for age. No intraparenchymal hemorrhage, mass effect nor midline shift. Patchy supratentorial white matter hypodensities are within normal range for patient's age and though non-specific likely represent chronic small vessel ischemic disease. No acute large vascular territory infarcts. No abnormal extra-axial fluid collections. Basal cisterns are patent. Skullbase beam hardening results in streak artifact through inferior cerebellum. ORBITS: The included ocular globes and orbital contents are non-suspicious. Status post bilateral ocular lens implants and LEFT scleral banding. SINUSES: Scattered small mucosal retention cysts. Status post RIGHT uncinectomy and ethmoidectomies. SKULL/SOFT TISSUES: No skull fracture. No significant soft tissue swelling. CT  CERVICAL SPINE FINDINGS OSSEOUS STRUCTURES: Cervical vertebral bodies and posterior elements are intact and aligned with maintenance of the cervical lordosis. Moderate to severe C6-7 disc height loss, uncovertebral hypertrophy compatible with degenerative disc. Multilevel intradiscal calcifications. Small amount of calcification about the odontoid process most compatible with CPPD. No destructive bony lesions. Osteopenia. C1-2 articulation maintained. SOFT TISSUES: Included prevertebral and paraspinal soft tissues are normal. IMPRESSION: CT HEAD: No acute intracranial process. Involutional changes and moderate chronic small vessel ischemic disease. CT CERVICAL SPINE: No acute fracture or malalignment. Electronically Signed   By: Elon Alas M.D.   On: 08/21/2015 02:28   Ct Cervical Spine Wo Contrast  08/21/2015   CLINICAL DATA:  [place] at nursing home approximately 7 hours ago. Hit back of head. Neck pain. Disoriented. EXAM: CT HEAD WITHOUT CONTRAST CT CERVICAL SPINE WITHOUT CONTRAST TECHNIQUE: Multidetector CT imaging of the head and cervical spine was performed following the standard protocol without intravenous contrast. Multiplanar CT image reconstructions of the cervical spine were also generated. COMPARISON:  None. FINDINGS: CT HEAD FINDINGS INTRACRANIAL CONTENTS: The ventricles and sulci are normal for age. No intraparenchymal hemorrhage, mass effect nor midline shift. Patchy supratentorial white matter hypodensities are within normal range for patient's age and though non-specific likely represent chronic small vessel ischemic disease. No acute large vascular territory infarcts. No abnormal extra-axial fluid collections. Basal cisterns are patent. Skullbase beam hardening results in streak artifact through inferior cerebellum. ORBITS: The included ocular globes and orbital contents are non-suspicious. Status post bilateral ocular lens implants and LEFT scleral banding. SINUSES: Scattered small mucosal retention cysts. Status post RIGHT uncinectomy and ethmoidectomies. SKULL/SOFT TISSUES: No skull fracture. No significant soft tissue swelling. CT CERVICAL SPINE FINDINGS OSSEOUS STRUCTURES: Cervical vertebral bodies and posterior elements are intact and aligned with maintenance of the cervical lordosis. Moderate to severe C6-7 disc height loss, uncovertebral hypertrophy compatible with degenerative disc. Multilevel intradiscal calcifications. Small amount of calcification about the odontoid process most compatible with CPPD. No destructive bony lesions. Osteopenia. C1-2 articulation maintained. SOFT TISSUES: Included prevertebral and paraspinal soft tissues are normal. IMPRESSION: CT HEAD: No acute intracranial process. Involutional changes and moderate chronic small vessel ischemic disease. CT CERVICAL SPINE: No acute  fracture or malalignment. Electronically Signed   By: Elon Alas M.D.   On: 08/21/2015 02:28   I have personally reviewed and evaluated these images and lab results as part of my medical decision-making.   EKG Interpretation None      MDM   Final diagnoses:  Fall  Scalp hematoma, initial encounter  Urinary retention  Contusion of right elbow, initial encounter    Patient with recurrent urinary retention.  Foley catheter placed.  Patient appears to have some mild sundowning which likely led to his fall.  CT imaging of his head and neck without acute traumatic pathology.  Repeat abdominal exam is without tenderness.  Plain films of the right elbow are without acute fracture.  Small superficial skin tear right elbow.  Dressed by nursing staff.  Afebrile.  Labs without significant abnormality.  Patient be transferred back to his rehabilitation facility  I had a long discussion with the patient's spouse and the spouse understands to return the patient to the emergency department for new or worsening symptoms.  Outpatient urology follow-up.  Close primary care follow-up   I personally performed the services described in this documentation, which was scribed in my presence. The recorded information has been reviewed and is accurate.       Jola Schmidt,  MD 08/21/15 [id number]

## 2015-08-21 NOTE — ED Notes (Signed)
Pt transported to [facility] NH with foley in place. Reprt to [facility] at. NH D/c instructions discussed with wife.

## 2015-08-21 NOTE — ED Notes (Signed)
Assisted Pt with urinal. Pt unable to void. Bladder scan indicates >99ml in bladder.

## 2015-08-22 LAB — URINE CULTURE: CULTURE: NO GROWTH

## 2015-08-23 ENCOUNTER — Other Ambulatory Visit: Payer: Self-pay

## 2015-08-24 DIAGNOSIS — R338 Other retention of urine: Secondary | ICD-10-CM | POA: Diagnosis not present

## 2015-08-26 ENCOUNTER — Telehealth (HOSPITAL_COMMUNITY): Payer: Self-pay

## 2015-08-26 NOTE — Progress Notes (Signed)
08/26/2015 10:06 AM  Received telephone call from lab stating that the urine tests ordered by Dr.Kim on 08/15/15 (protein electrophoresis and calcium creatinine) were unable to be completed by [facility] due to a mishap during testing.    [name]

## 2015-08-27 ENCOUNTER — Other Ambulatory Visit (HOSPITAL_COMMUNITY): Payer: Self-pay | Admitting: Interventional Radiology

## 2015-08-27 ENCOUNTER — Ambulatory Visit (HOSPITAL_COMMUNITY): Admit: 2015-08-27 | Payer: Medicare Other

## 2015-08-27 ENCOUNTER — Ambulatory Visit (HOSPITAL_COMMUNITY)
Admission: RE | Admit: 2015-08-27 | Discharge: 2015-08-27 | Disposition: A | Discharge status: Home / Self Care | Payer: Medicare Other | Source: Ambulatory Visit | Attending: Interventional Radiology | Admitting: Interventional Radiology

## 2015-08-27 DIAGNOSIS — M549 Dorsalgia, unspecified: Secondary | ICD-10-CM

## 2015-08-27 DIAGNOSIS — IMO0002 Reserved for concepts with insufficient information to code with codable children: Secondary | ICD-10-CM

## 2015-08-27 DIAGNOSIS — S22080A Wedge compression fracture of T11-T12 vertebra, initial encounter for closed fracture: Secondary | ICD-10-CM | POA: Diagnosis not present

## 2015-08-31 DIAGNOSIS — R338 Other retention of urine: Secondary | ICD-10-CM | POA: Diagnosis not present

## 2015-09-09 DIAGNOSIS — R338 Other retention of urine: Secondary | ICD-10-CM | POA: Diagnosis not present

## 2015-09-09 DIAGNOSIS — N401 Enlarged prostate with lower urinary tract symptoms: Secondary | ICD-10-CM | POA: Diagnosis not present

## 2015-09-16 DIAGNOSIS — N401 Enlarged prostate with lower urinary tract symptoms: Secondary | ICD-10-CM | POA: Diagnosis not present

## 2015-09-16 DIAGNOSIS — M6281 Muscle weakness (generalized): Secondary | ICD-10-CM | POA: Diagnosis not present

## 2015-09-16 DIAGNOSIS — S22080D Wedge compression fracture of T11-T12 vertebra, subsequent encounter for fracture with routine healing: Secondary | ICD-10-CM | POA: Diagnosis not present

## 2015-09-16 DIAGNOSIS — R2689 Other abnormalities of gait and mobility: Secondary | ICD-10-CM | POA: Diagnosis not present

## 2015-09-16 DIAGNOSIS — Z7982 Long term (current) use of aspirin: Secondary | ICD-10-CM | POA: Diagnosis not present

## 2015-09-16 DIAGNOSIS — R339 Retention of urine, unspecified: Secondary | ICD-10-CM | POA: Diagnosis not present

## 2015-09-16 DIAGNOSIS — R279 Unspecified lack of coordination: Secondary | ICD-10-CM | POA: Diagnosis not present

## 2015-09-16 DIAGNOSIS — I10 Essential (primary) hypertension: Secondary | ICD-10-CM | POA: Diagnosis not present

## 2015-09-17 DIAGNOSIS — R338 Other retention of urine: Secondary | ICD-10-CM | POA: Diagnosis not present

## 2015-09-22 DIAGNOSIS — I10 Essential (primary) hypertension: Secondary | ICD-10-CM | POA: Diagnosis not present

## 2015-09-22 DIAGNOSIS — S22080D Wedge compression fracture of T11-T12 vertebra, subsequent encounter for fracture with routine healing: Secondary | ICD-10-CM | POA: Diagnosis not present

## 2015-09-22 DIAGNOSIS — M6281 Muscle weakness (generalized): Secondary | ICD-10-CM | POA: Diagnosis not present

## 2015-09-22 DIAGNOSIS — R279 Unspecified lack of coordination: Secondary | ICD-10-CM | POA: Diagnosis not present

## 2015-09-22 DIAGNOSIS — N401 Enlarged prostate with lower urinary tract symptoms: Secondary | ICD-10-CM | POA: Diagnosis not present

## 2015-09-22 DIAGNOSIS — R2689 Other abnormalities of gait and mobility: Secondary | ICD-10-CM | POA: Diagnosis not present

## 2015-09-23 DIAGNOSIS — S22080D Wedge compression fracture of T11-T12 vertebra, subsequent encounter for fracture with routine healing: Secondary | ICD-10-CM | POA: Diagnosis not present

## 2015-09-23 DIAGNOSIS — R2689 Other abnormalities of gait and mobility: Secondary | ICD-10-CM | POA: Diagnosis not present

## 2015-09-23 DIAGNOSIS — I10 Essential (primary) hypertension: Secondary | ICD-10-CM | POA: Diagnosis not present

## 2015-09-23 DIAGNOSIS — R279 Unspecified lack of coordination: Secondary | ICD-10-CM | POA: Diagnosis not present

## 2015-09-23 DIAGNOSIS — M6281 Muscle weakness (generalized): Secondary | ICD-10-CM | POA: Diagnosis not present

## 2015-09-23 DIAGNOSIS — H18422 Band keratopathy, left eye: Secondary | ICD-10-CM | POA: Diagnosis not present

## 2015-09-23 DIAGNOSIS — H401112 Primary open-angle glaucoma, right eye, moderate stage: Secondary | ICD-10-CM | POA: Diagnosis not present

## 2015-09-23 DIAGNOSIS — N401 Enlarged prostate with lower urinary tract symptoms: Secondary | ICD-10-CM | POA: Diagnosis not present

## 2015-09-24 DIAGNOSIS — I10 Essential (primary) hypertension: Secondary | ICD-10-CM | POA: Diagnosis not present

## 2015-09-24 DIAGNOSIS — N401 Enlarged prostate with lower urinary tract symptoms: Secondary | ICD-10-CM | POA: Diagnosis not present

## 2015-09-24 DIAGNOSIS — S22080D Wedge compression fracture of T11-T12 vertebra, subsequent encounter for fracture with routine healing: Secondary | ICD-10-CM | POA: Diagnosis not present

## 2015-09-24 DIAGNOSIS — R279 Unspecified lack of coordination: Secondary | ICD-10-CM | POA: Diagnosis not present

## 2015-09-24 DIAGNOSIS — R2689 Other abnormalities of gait and mobility: Secondary | ICD-10-CM | POA: Diagnosis not present

## 2015-09-24 DIAGNOSIS — M6281 Muscle weakness (generalized): Secondary | ICD-10-CM | POA: Diagnosis not present

## 2015-09-27 DIAGNOSIS — N401 Enlarged prostate with lower urinary tract symptoms: Secondary | ICD-10-CM | POA: Diagnosis not present

## 2015-09-27 DIAGNOSIS — R279 Unspecified lack of coordination: Secondary | ICD-10-CM | POA: Diagnosis not present

## 2015-09-27 DIAGNOSIS — I10 Essential (primary) hypertension: Secondary | ICD-10-CM | POA: Diagnosis not present

## 2015-09-27 DIAGNOSIS — R2689 Other abnormalities of gait and mobility: Secondary | ICD-10-CM | POA: Diagnosis not present

## 2015-09-27 DIAGNOSIS — M6281 Muscle weakness (generalized): Secondary | ICD-10-CM | POA: Diagnosis not present

## 2015-09-27 DIAGNOSIS — S22080D Wedge compression fracture of T11-T12 vertebra, subsequent encounter for fracture with routine healing: Secondary | ICD-10-CM | POA: Diagnosis not present

## 2015-09-28 DIAGNOSIS — H31402 Unspecified choroidal detachment, left eye: Secondary | ICD-10-CM | POA: Diagnosis not present

## 2015-09-29 DIAGNOSIS — S22080D Wedge compression fracture of T11-T12 vertebra, subsequent encounter for fracture with routine healing: Secondary | ICD-10-CM | POA: Diagnosis not present

## 2015-09-29 DIAGNOSIS — N401 Enlarged prostate with lower urinary tract symptoms: Secondary | ICD-10-CM | POA: Diagnosis not present

## 2015-09-29 DIAGNOSIS — R279 Unspecified lack of coordination: Secondary | ICD-10-CM | POA: Diagnosis not present

## 2015-09-29 DIAGNOSIS — I10 Essential (primary) hypertension: Secondary | ICD-10-CM | POA: Diagnosis not present

## 2015-09-29 DIAGNOSIS — M6281 Muscle weakness (generalized): Secondary | ICD-10-CM | POA: Diagnosis not present

## 2015-09-29 DIAGNOSIS — R2689 Other abnormalities of gait and mobility: Secondary | ICD-10-CM | POA: Diagnosis not present

## 2015-10-01 DIAGNOSIS — N401 Enlarged prostate with lower urinary tract symptoms: Secondary | ICD-10-CM | POA: Diagnosis not present

## 2015-10-01 DIAGNOSIS — R279 Unspecified lack of coordination: Secondary | ICD-10-CM | POA: Diagnosis not present

## 2015-10-01 DIAGNOSIS — R2689 Other abnormalities of gait and mobility: Secondary | ICD-10-CM | POA: Diagnosis not present

## 2015-10-01 DIAGNOSIS — M6281 Muscle weakness (generalized): Secondary | ICD-10-CM | POA: Diagnosis not present

## 2015-10-01 DIAGNOSIS — R634 Abnormal weight loss: Secondary | ICD-10-CM | POA: Diagnosis not present

## 2015-10-01 DIAGNOSIS — I10 Essential (primary) hypertension: Secondary | ICD-10-CM | POA: Diagnosis not present

## 2015-10-01 DIAGNOSIS — S22080D Wedge compression fracture of T11-T12 vertebra, subsequent encounter for fracture with routine healing: Secondary | ICD-10-CM | POA: Diagnosis not present

## 2015-10-01 DIAGNOSIS — R5383 Other fatigue: Secondary | ICD-10-CM | POA: Diagnosis not present

## 2015-10-04 DIAGNOSIS — I10 Essential (primary) hypertension: Secondary | ICD-10-CM | POA: Diagnosis not present

## 2015-10-04 DIAGNOSIS — M6281 Muscle weakness (generalized): Secondary | ICD-10-CM | POA: Diagnosis not present

## 2015-10-04 DIAGNOSIS — R2689 Other abnormalities of gait and mobility: Secondary | ICD-10-CM | POA: Diagnosis not present

## 2015-10-04 DIAGNOSIS — S22080D Wedge compression fracture of T11-T12 vertebra, subsequent encounter for fracture with routine healing: Secondary | ICD-10-CM | POA: Diagnosis not present

## 2015-10-04 DIAGNOSIS — R279 Unspecified lack of coordination: Secondary | ICD-10-CM | POA: Diagnosis not present

## 2015-10-04 DIAGNOSIS — N401 Enlarged prostate with lower urinary tract symptoms: Secondary | ICD-10-CM | POA: Diagnosis not present

## 2015-10-05 DIAGNOSIS — T1502XD Foreign body in cornea, left eye, subsequent encounter: Secondary | ICD-10-CM | POA: Diagnosis not present

## 2015-10-07 DIAGNOSIS — M6281 Muscle weakness (generalized): Secondary | ICD-10-CM | POA: Diagnosis not present

## 2015-10-07 DIAGNOSIS — R279 Unspecified lack of coordination: Secondary | ICD-10-CM | POA: Diagnosis not present

## 2015-10-07 DIAGNOSIS — N401 Enlarged prostate with lower urinary tract symptoms: Secondary | ICD-10-CM | POA: Diagnosis not present

## 2015-10-07 DIAGNOSIS — I10 Essential (primary) hypertension: Secondary | ICD-10-CM | POA: Diagnosis not present

## 2015-10-07 DIAGNOSIS — S22080D Wedge compression fracture of T11-T12 vertebra, subsequent encounter for fracture with routine healing: Secondary | ICD-10-CM | POA: Diagnosis not present

## 2015-10-07 DIAGNOSIS — R2689 Other abnormalities of gait and mobility: Secondary | ICD-10-CM | POA: Diagnosis not present

## 2015-10-11 DIAGNOSIS — R2689 Other abnormalities of gait and mobility: Secondary | ICD-10-CM | POA: Diagnosis not present

## 2015-10-11 DIAGNOSIS — N401 Enlarged prostate with lower urinary tract symptoms: Secondary | ICD-10-CM | POA: Diagnosis not present

## 2015-10-11 DIAGNOSIS — S22080D Wedge compression fracture of T11-T12 vertebra, subsequent encounter for fracture with routine healing: Secondary | ICD-10-CM | POA: Diagnosis not present

## 2015-10-11 DIAGNOSIS — R279 Unspecified lack of coordination: Secondary | ICD-10-CM | POA: Diagnosis not present

## 2015-10-11 DIAGNOSIS — I10 Essential (primary) hypertension: Secondary | ICD-10-CM | POA: Diagnosis not present

## 2015-10-11 DIAGNOSIS — M6281 Muscle weakness (generalized): Secondary | ICD-10-CM | POA: Diagnosis not present

## 2015-10-12 DIAGNOSIS — R338 Other retention of urine: Secondary | ICD-10-CM | POA: Diagnosis not present

## 2015-10-13 ENCOUNTER — Other Ambulatory Visit: Payer: Self-pay | Admitting: Urology

## 2015-10-15 ENCOUNTER — Other Ambulatory Visit: Payer: Self-pay | Admitting: Urology

## 2015-10-19 DIAGNOSIS — H401112 Primary open-angle glaucoma, right eye, moderate stage: Secondary | ICD-10-CM | POA: Diagnosis not present

## 2015-10-21 DIAGNOSIS — M6281 Muscle weakness (generalized): Secondary | ICD-10-CM | POA: Diagnosis not present

## 2015-10-21 DIAGNOSIS — R279 Unspecified lack of coordination: Secondary | ICD-10-CM | POA: Diagnosis not present

## 2015-10-21 DIAGNOSIS — N401 Enlarged prostate with lower urinary tract symptoms: Secondary | ICD-10-CM | POA: Diagnosis not present

## 2015-10-21 DIAGNOSIS — R2689 Other abnormalities of gait and mobility: Secondary | ICD-10-CM | POA: Diagnosis not present

## 2015-10-21 DIAGNOSIS — S22080D Wedge compression fracture of T11-T12 vertebra, subsequent encounter for fracture with routine healing: Secondary | ICD-10-CM | POA: Diagnosis not present

## 2015-10-21 DIAGNOSIS — I10 Essential (primary) hypertension: Secondary | ICD-10-CM | POA: Diagnosis not present

## 2015-10-27 ENCOUNTER — Encounter (HOSPITAL_COMMUNITY): Payer: Self-pay

## 2015-10-28 DIAGNOSIS — S22080D Wedge compression fracture of T11-T12 vertebra, subsequent encounter for fracture with routine healing: Secondary | ICD-10-CM | POA: Diagnosis not present

## 2015-10-28 DIAGNOSIS — N401 Enlarged prostate with lower urinary tract symptoms: Secondary | ICD-10-CM | POA: Diagnosis not present

## 2015-10-28 DIAGNOSIS — I10 Essential (primary) hypertension: Secondary | ICD-10-CM | POA: Diagnosis not present

## 2015-10-28 DIAGNOSIS — M6281 Muscle weakness (generalized): Secondary | ICD-10-CM | POA: Diagnosis not present

## 2015-10-28 DIAGNOSIS — R2689 Other abnormalities of gait and mobility: Secondary | ICD-10-CM | POA: Diagnosis not present

## 2015-10-28 DIAGNOSIS — R279 Unspecified lack of coordination: Secondary | ICD-10-CM | POA: Diagnosis not present

## 2015-11-01 ENCOUNTER — Encounter (HOSPITAL_COMMUNITY): Payer: Self-pay

## 2015-11-01 NOTE — Patient Instructions (Addendum)
[name]  11/01/2015   Your procedure is scheduled on: 11-05-15  Report to [facility]  Entrance take [facility]  elevators to 3rd floor to  [place] at   0730 AM.  Call this number if you have problems the morning of surgery [phone]   Remember: ONLY 1 PERSON MAY GO WITH YOU TO SHORT STAY TO GET  READY MORNING OF [place].  Do not eat food or drink liquids :After Midnight.     Take these medicines the morning of surgery with A SIP OF WATER: Levothyroxine. Eye drops-usual. DO NOT TAKE ANY DIABETIC MEDICATIONS DAY OF YOUR SURGERY                               You may not have any metal on your body including hair pins and              piercings  Do not wear jewelry, make-up, lotions, powders or perfumes, deodorant             Do not wear nail polish.  Do not shave  48 hours prior to surgery.              Men may shave face and neck.   Do not bring valuables to the hospital. [place].  Contacts, dentures or bridgework may not be worn into surgery.  Leave suitcase in the car. After surgery it may be brought to your room.     Patients discharged the day of surgery will not be allowed to drive home.  Name and phone number of your driver: Hinton Dyer -spouse [phone] h  Special Instructions: N/A              Please read over the following fact sheets you were given: _____________________________________________________________________             [facility] - Preparing for Surgery Before surgery, you can play an important role.  Because skin is not sterile, your skin needs to be as free of germs as possible.  You can reduce the number of germs on your skin by washing with CHG (chlorahexidine gluconate) soap before surgery.  CHG is an antiseptic cleaner which kills germs and bonds with the skin to continue killing germs even after washing. Please DO NOT use if you have an allergy to CHG or  antibacterial soaps.  If your skin becomes reddened/irritated stop using the CHG and inform your nurse when you arrive at Short Stay. Do not shave (including legs and underarms) for at least 48 hours prior to the first CHG shower.  You may shave your face/neck. Please follow these instructions carefully:  1.  Shower with CHG Soap the night before surgery and the  morning of Surgery.  2.  If you choose to wash your hair, wash your hair first as usual with your  normal  shampoo.  3.  After you shampoo, rinse your hair and body thoroughly to remove the  shampoo.                           4.  Use CHG as you would any other liquid soap.  You can apply chg  directly  to the skin and wash                       Gently with a scrungie or clean washcloth.  5.  Apply the CHG Soap to your body ONLY FROM THE NECK DOWN.   Do not use on face/ open                           Wound or open sores. Avoid contact with eyes, ears mouth and genitals (private parts).                       Wash face,  Genitals (private parts) with your normal soap.             6.  Wash thoroughly, paying special attention to the area where your surgery  will be performed.  7.  Thoroughly rinse your body with warm water from the neck down.  8.  DO NOT shower/wash with your normal soap after using and rinsing off  the CHG Soap.                9.  Pat yourself dry with a clean towel.            10.  Wear clean pajamas.            11.  Place clean sheets on your bed the night of your first shower and do not  sleep with pets. Day of Surgery : Do not apply any lotions/deodorants the morning of surgery.  Please wear clean clothes to the hospital/surgery center.  FAILURE TO FOLLOW THESE INSTRUCTIONS MAY RESULT IN THE CANCELLATION OF YOUR SURGERY PATIENT SIGNATURE_________________________________  NURSE SIGNATURE__________________________________  ________________________________________________________________________

## 2015-11-02 ENCOUNTER — Encounter (HOSPITAL_COMMUNITY): Payer: Self-pay

## 2015-11-02 ENCOUNTER — Encounter (HOSPITAL_COMMUNITY)
Admission: RE | Admit: 2015-11-02 | Discharge: 2015-11-02 | Disposition: A | Discharge status: Home / Self Care | Payer: Medicare Other | Source: Ambulatory Visit | Attending: Urology | Admitting: Urology

## 2015-11-02 DIAGNOSIS — G4730 Sleep apnea, unspecified: Secondary | ICD-10-CM | POA: Diagnosis not present

## 2015-11-02 DIAGNOSIS — N401 Enlarged prostate with lower urinary tract symptoms: Secondary | ICD-10-CM | POA: Diagnosis not present

## 2015-11-02 DIAGNOSIS — R338 Other retention of urine: Secondary | ICD-10-CM | POA: Diagnosis not present

## 2015-11-02 DIAGNOSIS — C61 Malignant neoplasm of prostate: Secondary | ICD-10-CM | POA: Diagnosis not present

## 2015-11-02 DIAGNOSIS — N3289 Other specified disorders of bladder: Secondary | ICD-10-CM | POA: Diagnosis not present

## 2015-11-02 DIAGNOSIS — Z87891 Personal history of nicotine dependence: Secondary | ICD-10-CM | POA: Diagnosis not present

## 2015-11-02 DIAGNOSIS — N529 Male erectile dysfunction, unspecified: Secondary | ICD-10-CM | POA: Diagnosis not present

## 2015-11-02 DIAGNOSIS — E039 Hypothyroidism, unspecified: Secondary | ICD-10-CM | POA: Diagnosis not present

## 2015-11-02 DIAGNOSIS — N210 Calculus in bladder: Secondary | ICD-10-CM | POA: Diagnosis not present

## 2015-11-02 DIAGNOSIS — Z87442 Personal history of urinary calculi: Secondary | ICD-10-CM | POA: Diagnosis not present

## 2015-11-02 DIAGNOSIS — Z79899 Other long term (current) drug therapy: Secondary | ICD-10-CM | POA: Diagnosis not present

## 2015-11-02 DIAGNOSIS — H3552 Pigmentary retinal dystrophy: Secondary | ICD-10-CM | POA: Diagnosis not present

## 2015-11-02 DIAGNOSIS — N138 Other obstructive and reflux uropathy: Secondary | ICD-10-CM | POA: Diagnosis not present

## 2015-11-02 HISTORY — DX: Presence of other specified devices: Z97.8

## 2015-11-02 HISTORY — DX: Acute kidney failure, unspecified: N17.9

## 2015-11-02 HISTORY — DX: Myoneural disorder, unspecified: G70.9

## 2015-11-02 HISTORY — DX: Unspecified osteoarthritis, unspecified site: M19.90

## 2015-11-02 HISTORY — DX: Unspecified hearing loss, bilateral: H91.93

## 2015-11-02 HISTORY — DX: Unspecified visual loss: H54.7

## 2015-11-02 HISTORY — DX: Presence of urogenital implants: Z96.0

## 2015-11-02 HISTORY — DX: Hypothyroidism, unspecified: E03.9

## 2015-11-02 HISTORY — DX: Chronic kidney disease, unspecified: N18.9

## 2015-11-02 LAB — BASIC METABOLIC PANEL
Anion gap: 8 (ref 5–15)
BUN: 21 mg/dL — AB (ref 6–20)
CHLORIDE: 102 mmol/L (ref 101–111)
CO2: 28 mmol/L (ref 22–32)
Calcium: 9.1 mg/dL (ref 8.9–10.3)
Creatinine, Ser: 0.68 mg/dL (ref 0.61–1.24)
GFR calc Af Amer: >60 mL/min (ref >60)
GLUCOSE: 98 mg/dL (ref 65–99)
POTASSIUM: 3.6 mmol/L (ref 3.5–5.1)
Sodium: 138 mmol/L (ref 135–145)

## 2015-11-02 LAB — CBC
HEMATOCRIT: 38.2 % — AB (ref 39.0–52.0)
Hemoglobin: 12.6 g/dL — ABNORMAL LOW (ref 13.0–17.0)
MCH: 32.2 pg (ref 26.0–34.0)
MCHC: 33 g/dL (ref 30.0–36.0)
MCV: 97.7 fL (ref 78.0–100.0)
Platelets: 255 10*3/uL (ref 150–400)
RBC: 3.91 MIL/uL — ABNORMAL LOW (ref 4.22–5.81)
RDW: 14.5 % (ref 11.5–15.5)
WBC: 5.8 10*3/uL (ref 4.0–10.5)

## 2015-11-03 ENCOUNTER — Encounter (HOSPITAL_COMMUNITY): Payer: Self-pay

## 2015-11-03 NOTE — Pre-Procedure Instructions (Signed)
11-03-15 1650 Dr. Lamarr Lulas -reviewed EKG of 11-02-15, may proceed as planned.

## 2015-11-03 NOTE — Pre-Procedure Instructions (Signed)
CXR 7'17 Epic

## 2015-11-05 ENCOUNTER — Ambulatory Visit (HOSPITAL_COMMUNITY)
Admission: RE | Admit: 2015-11-05 | Discharge: 2015-11-06 | Disposition: A | Discharge status: Home / Self Care | Payer: Medicare Other | Source: Ambulatory Visit | Attending: Urology | Admitting: Urology

## 2015-11-05 ENCOUNTER — Ambulatory Visit (HOSPITAL_COMMUNITY): Payer: Medicare Other | Admitting: Anesthesiology

## 2015-11-05 ENCOUNTER — Encounter (HOSPITAL_COMMUNITY)
Admission: RE | Disposition: A | Discharge status: Home / Self Care | Payer: Self-pay | Source: Ambulatory Visit | Attending: Urology

## 2015-11-05 ENCOUNTER — Encounter (HOSPITAL_COMMUNITY): Payer: Self-pay

## 2015-11-05 DIAGNOSIS — H3552 Pigmentary retinal dystrophy: Secondary | ICD-10-CM | POA: Insufficient documentation

## 2015-11-05 DIAGNOSIS — C61 Malignant neoplasm of prostate: Secondary | ICD-10-CM | POA: Diagnosis not present

## 2015-11-05 DIAGNOSIS — E039 Hypothyroidism, unspecified: Secondary | ICD-10-CM | POA: Insufficient documentation

## 2015-11-05 DIAGNOSIS — Z87442 Personal history of urinary calculi: Secondary | ICD-10-CM | POA: Diagnosis not present

## 2015-11-05 DIAGNOSIS — Z79899 Other long term (current) drug therapy: Secondary | ICD-10-CM | POA: Diagnosis not present

## 2015-11-05 DIAGNOSIS — N401 Enlarged prostate with lower urinary tract symptoms: Secondary | ICD-10-CM | POA: Diagnosis not present

## 2015-11-05 DIAGNOSIS — Z87891 Personal history of nicotine dependence: Secondary | ICD-10-CM | POA: Insufficient documentation

## 2015-11-05 DIAGNOSIS — N210 Calculus in bladder: Secondary | ICD-10-CM | POA: Diagnosis not present

## 2015-11-05 DIAGNOSIS — N529 Male erectile dysfunction, unspecified: Secondary | ICD-10-CM | POA: Diagnosis not present

## 2015-11-05 DIAGNOSIS — R338 Other retention of urine: Secondary | ICD-10-CM | POA: Diagnosis not present

## 2015-11-05 DIAGNOSIS — G4730 Sleep apnea, unspecified: Secondary | ICD-10-CM | POA: Insufficient documentation

## 2015-11-05 DIAGNOSIS — N138 Other obstructive and reflux uropathy: Secondary | ICD-10-CM | POA: Insufficient documentation

## 2015-11-05 DIAGNOSIS — N3289 Other specified disorders of bladder: Secondary | ICD-10-CM | POA: Diagnosis not present

## 2015-11-05 DIAGNOSIS — N189 Chronic kidney disease, unspecified: Secondary | ICD-10-CM | POA: Diagnosis not present

## 2015-11-05 HISTORY — PX: INSERTION OF SUPRAPUBIC CATHETER: SHX5870

## 2015-11-05 HISTORY — PX: TRANSURETHRAL RESECTION OF PROSTATE: SHX73

## 2015-11-05 SURGERY — TURP (TRANSURETHRAL RESECTION OF PROSTATE)
Anesthesia: General

## 2015-11-05 MED ORDER — LIDOCAINE HCL (PF) 1 % IJ SOLN
INTRAMUSCULAR | Status: DC | PRN
  Administered 2015-11-05: 10 mL

## 2015-11-05 MED ORDER — CEFTRIAXONE SODIUM 2 G IJ SOLR
INTRAMUSCULAR | Status: AC
  Filled 2015-11-05: qty 2

## 2015-11-05 MED ORDER — PROPOFOL 10 MG/ML IV BOLUS
INTRAVENOUS | Status: DC | PRN
  Administered 2015-11-05: 100 mg via INTRAVENOUS

## 2015-11-05 MED ORDER — ONDANSETRON HCL 4 MG/2ML IJ SOLN: 4.0000 mg | INTRAMUSCULAR | Status: DC | PRN

## 2015-11-05 MED ORDER — SODIUM CHLORIDE 0.9 % IR SOLN
3000.0000 mL | Status: DC
  Administered 2015-11-05 – 2015-11-06 (×3): 3000 mL via INTRAVESICAL

## 2015-11-05 MED ORDER — LACTATED RINGERS IV SOLN
INTRAVENOUS | Status: DC
  Administered 2015-11-05: 09:00:00 via INTRAVENOUS

## 2015-11-05 MED ORDER — PHENYLEPHRINE HCL 10 MG/ML IJ SOLN
INTRAMUSCULAR | Status: DC | PRN
  Administered 2015-11-05: 40 ug via INTRAVENOUS

## 2015-11-05 MED ORDER — ONDANSETRON HCL 4 MG/2ML IJ SOLN
INTRAMUSCULAR | Status: DC | PRN
  Administered 2015-11-05: 4 mg via INTRAVENOUS

## 2015-11-05 MED ORDER — HYDROCODONE-ACETAMINOPHEN 5-325 MG PO TABS: 1.0000 | ORAL_TABLET | ORAL | Status: DC | PRN

## 2015-11-05 MED ORDER — HYDROCODONE-ACETAMINOPHEN 7.5-325 MG PO TABS: 1.0000 | ORAL_TABLET | ORAL | 0 refills | Status: DC | PRN

## 2015-11-05 MED ORDER — FENTANYL CITRATE (PF) 100 MCG/2ML IJ SOLN
INTRAMUSCULAR | Status: DC | PRN
  Administered 2015-11-05 (×2): 25 ug via INTRAVENOUS

## 2015-11-05 MED ORDER — PHENYLEPHRINE 40 MCG/ML (10ML) SYRINGE FOR IV PUSH (FOR BLOOD PRESSURE SUPPORT)
PREFILLED_SYRINGE | INTRAVENOUS | Status: AC
  Filled 2015-11-05: qty 10

## 2015-11-05 MED ORDER — SODIUM CHLORIDE 0.9 % IV SOLN
INTRAVENOUS | Status: DC
  Administered 2015-11-05 – 2015-11-06 (×2): via INTRAVENOUS

## 2015-11-05 MED ORDER — FENTANYL CITRATE (PF) 100 MCG/2ML IJ SOLN
INTRAMUSCULAR | Status: AC
  Filled 2015-11-05: qty 2

## 2015-11-05 MED ORDER — BACITRACIN-NEOMYCIN-POLYMYXIN 400-5-5000 EX OINT
1.0000 "application " | TOPICAL_OINTMENT | Freq: Three times a day (TID) | CUTANEOUS | Status: DC | PRN
  Filled 2015-11-05: qty 1

## 2015-11-05 MED ORDER — FENTANYL CITRATE (PF) 100 MCG/2ML IJ SOLN
25.0000 ug | INTRAMUSCULAR | Status: DC | PRN
  Administered 2015-11-05 (×2): 25 ug via INTRAVENOUS
  Administered 2015-11-05: 50 ug via INTRAVENOUS

## 2015-11-05 MED ORDER — LIDOCAINE HCL 1 % IJ SOLN
INTRAMUSCULAR | Status: AC
  Filled 2015-11-05: qty 20

## 2015-11-05 MED ORDER — ONDANSETRON HCL 4 MG/2ML IJ SOLN
INTRAMUSCULAR | Status: AC
  Filled 2015-11-05: qty 2

## 2015-11-05 MED ORDER — BELLADONNA ALKALOIDS-OPIUM 16.2-60 MG RE SUPP: 1.0000 | Freq: Four times a day (QID) | RECTAL | Status: DC | PRN

## 2015-11-05 MED ORDER — ACETAMINOPHEN 325 MG PO TABS: 650.0000 mg | ORAL_TABLET | ORAL | Status: DC | PRN

## 2015-11-05 MED ORDER — LIDOCAINE 2% (20 MG/ML) 5 ML SYRINGE
INTRAMUSCULAR | Status: AC
  Filled 2015-11-05: qty 5

## 2015-11-05 MED ORDER — PROPOFOL 10 MG/ML IV BOLUS
INTRAVENOUS | Status: AC
  Filled 2015-11-05: qty 20

## 2015-11-05 MED ORDER — TRIMETHOPRIM 100 MG PO TABS: 100.0000 mg | ORAL_TABLET | Freq: Two times a day (BID) | ORAL | 0 refills | Status: DC

## 2015-11-05 MED ORDER — LIDOCAINE HCL (CARDIAC) 20 MG/ML IV SOLN
INTRAVENOUS | Status: DC | PRN
  Administered 2015-11-05: 40 mg via INTRAVENOUS

## 2015-11-05 MED ORDER — CEFTRIAXONE SODIUM 2 G IJ SOLR
2.0000 g | Freq: Once | INTRAMUSCULAR | Status: AC
  Administered 2015-11-05: 30 g via INTRAVENOUS

## 2015-11-05 MED ORDER — HYDROMORPHONE HCL 1 MG/ML IJ SOLN: 0.5000 mg | INTRAMUSCULAR | Status: DC | PRN

## 2015-11-05 MED ORDER — DEXTROSE 5 % IV SOLN: 2.0000 g | Freq: Once | INTRAVENOUS | Status: DC

## 2015-11-05 MED ORDER — SODIUM CHLORIDE 0.9 % IR SOLN
Status: DC | PRN
  Administered 2015-11-05: 12000 mL

## 2015-11-05 MED ORDER — 0.9 % SODIUM CHLORIDE (POUR BTL) OPTIME
TOPICAL | Status: DC | PRN
  Administered 2015-11-05: 1000 mL

## 2015-11-05 SURGICAL SUPPLY — 43 items
BAG URINE DRAINAGE (UROLOGICAL SUPPLIES) IMPLANT
BAG URO CATCHER STRL LF (MISCELLANEOUS) ×2 IMPLANT
BLADE CLIPPER SURG (BLADE) IMPLANT
BLADE SURG 15 STRL LF DISP TIS (BLADE) IMPLANT
BLADE SURG 15 STRL SS (BLADE)
CATH BONANNO SUPRAPUBIC 14G (CATHETERS) IMPLANT
CATH FOLEY 2WAY SLVR  5CC 16FR (CATHETERS) ×1
CATH FOLEY 2WAY SLVR  5CC 18FR (CATHETERS)
CATH FOLEY 2WAY SLVR  5CC 20FR (CATHETERS) ×1
CATH FOLEY 2WAY SLVR 5CC 16FR (CATHETERS) IMPLANT
CATH FOLEY 2WAY SLVR 5CC 18FR (CATHETERS) IMPLANT
CATH FOLEY 2WAY SLVR 5CC 20FR (CATHETERS) IMPLANT
CATH FOLEY 3WAY 30CC 24FR (CATHETERS) ×2
CATH FOLEY INTRO SUPRA 16F (CATHETERS) ×1 IMPLANT
CATH URTH STD 24FR FL 3W 2 (CATHETERS) ×1 IMPLANT
ELECT PENCIL ROCKER SW 15FT (MISCELLANEOUS) ×1 IMPLANT
ELECT REM PT RETURN 9FT ADLT (ELECTROSURGICAL) ×2
ELECTRODE REM PT RTRN 9FT ADLT (ELECTROSURGICAL) ×1 IMPLANT
EVACUATOR MICROVAS BLADDER (UROLOGICAL SUPPLIES) ×2 IMPLANT
GAUZE SPONGE 4X4 12PLY STRL (GAUZE/BANDAGES/DRESSINGS) ×1 IMPLANT
GLOVE BIO SURGEON STRL SZ8 (GLOVE) ×2 IMPLANT
GLOVE BIOGEL M 8.0 STRL (GLOVE) ×2 IMPLANT
GOWN STRL REUS W/ TWL XL LVL3 (GOWN DISPOSABLE) ×1 IMPLANT
GOWN STRL REUS W/TWL XL LVL3 (GOWN DISPOSABLE) ×4 IMPLANT
HOLDER FOLEY CATH W/STRAP (MISCELLANEOUS) IMPLANT
IV NS IRRIG 3000ML ARTHROMATIC (IV SOLUTION) IMPLANT
LOOP CUT BIPOLAR 24F LRG (ELECTROSURGICAL) IMPLANT
MANIFOLD NEPTUNE II (INSTRUMENTS) ×2 IMPLANT
NDL HYPO 25X1 1.5 SAFETY (NEEDLE) ×1 IMPLANT
NEEDLE HYPO 25X1 1.5 SAFETY (NEEDLE) ×2 IMPLANT
NS IRRIG 1000ML POUR BTL (IV SOLUTION) ×2 IMPLANT
PACK CYSTO (CUSTOM PROCEDURE TRAY) ×2 IMPLANT
SET ASPIRATION TUBING (TUBING) ×1 IMPLANT
SUT ETHILON 3 0 PS 1 (SUTURE) ×1 IMPLANT
SUT SILK 0 (SUTURE) ×2
SUT SILK 0 30XBRD TIE 6 (SUTURE) ×1 IMPLANT
SUT SILK 2 0 FS (SUTURE) IMPLANT
SYR 30ML LL (SYRINGE) ×1 IMPLANT
SYRINGE IRR TOOMEY STRL 70CC (SYRINGE) IMPLANT
TAPE CLOTH SURG 4X10 WHT LF (GAUZE/BANDAGES/DRESSINGS) ×1 IMPLANT
TUBING CONNECTING 10 (TUBING) ×2 IMPLANT
WATER STERILE IRR 3000ML UROMA (IV SOLUTION) ×2 IMPLANT
WIRE COONS/BENSON .038X145CM (WIRE) IMPLANT

## 2015-11-05 NOTE — Anesthesia Preprocedure Evaluation (Addendum)
Anesthesia Evaluation  Patient identified by MRN, date of birth, ID band Patient awake    Reviewed: Allergy & Precautions, NPO status , Patient's Chart, lab work & pertinent test results  Airway Mallampati: II  TM Distance: >3 FB Neck ROM: Full    Dental no notable dental hx.    Pulmonary neg pulmonary ROS,    Pulmonary exam normal breath sounds clear to auscultation       Cardiovascular negative cardio ROS Normal cardiovascular exam Rhythm:Regular Rate:Normal     Neuro/Psych  Neuromuscular disease negative psych ROS   GI/Hepatic negative GI ROS, Neg liver ROS,   Endo/Other  Hypothyroidism   Renal/GU Renal InsufficiencyRenal disease  negative genitourinary   Musculoskeletal  (+) Arthritis ,   Abdominal   Peds negative pediatric ROS (+)  Hematology negative hematology ROS (+)   Anesthesia Other Findings   Reproductive/Obstetrics negative OB ROS                             Anesthesia Physical Anesthesia Plan  ASA: III  Anesthesia Plan: General   Post-op Pain Management:    Induction: Intravenous  Airway Management Planned: LMA  Additional Equipment:   Intra-op Plan:   Post-operative Plan: Extubation in OR  Informed Consent: I have reviewed the patients History and Physical, chart, labs and discussed the procedure including the risks, benefits and alternatives for the proposed anesthesia with the patient or authorized representative who has indicated his/her understanding and acceptance.   Dental advisory given  Plan Discussed with: CRNA  Anesthesia Plan Comments:         Anesthesia Quick Evaluation

## 2015-11-05 NOTE — Transfer of Care (Signed)
Immediate Anesthesia Transfer of Care Note  Patient: [name]  Procedure(s) Performed: Procedure(s): TRANSURETHRAL RESECTION OF THE PROSTATE (TURP) WITH GYRUS (N/A) INSERTION OF SUPRAPUBIC CATHETER (N/A)  Patient Location: PACU  Anesthesia Type:General  Level of Consciousness: sedated  Airway & Oxygen Therapy: Patient Spontanous Breathing and Patient connected to face mask oxygen  Post-op Assessment: Report given to RN and Post -op Vital signs reviewed and stable  Post vital signs: Reviewed and stable  Last Vitals:  Vitals:   11/05/15 0733  BP: 115/73  Pulse: 92  Resp: 16  Temp: 36.8 C    Last Pain:  Vitals:   11/05/15 0733  TempSrc: Oral         Complications: No apparent anesthesia complications

## 2015-11-05 NOTE — Anesthesia Postprocedure Evaluation (Signed)
Anesthesia Post Note  Patient: [name]  Procedure(s) Performed: Procedure(s) (LRB): TRANSURETHRAL RESECTION OF THE PROSTATE (TURP) WITH GYRUS (N/A) INSERTION OF SUPRAPUBIC CATHETER (N/A)  Patient location during evaluation: PACU Anesthesia Type: General Level of consciousness: awake and alert Pain management: pain level controlled Vital Signs Assessment: post-procedure vital signs reviewed and stable Respiratory status: spontaneous breathing, nonlabored ventilation, respiratory function stable and patient connected to nasal cannula oxygen Cardiovascular status: blood pressure returned to baseline and stable Postop Assessment: no signs of nausea or vomiting Anesthetic complications: no    Last Vitals:  Vitals:   11/05/15 1215 11/05/15 1230  BP: 118/64 123/77  Pulse: 71 62  Resp: 15 16  Temp:  36.5 C    Last Pain:  Vitals:   11/05/15 1230  TempSrc:   PainSc: 5                  Merlinda Wrubel J

## 2015-11-05 NOTE — Op Note (Signed)
PATIENT:  [name]  PRE-OPERATIVE DIAGNOSIS: 1. BPH with outlet obstruction 2. Urinary retention  POST-OPERATIVE DIAGNOSIS: 1. BPH with outlet obstruction 2. Urinary retention 3. Bladder calculi  PROCEDURE: 1. Removal of bladder calculi  2. TURP 3. Suprapubic tube placement  SURGEON:  Claybon Jabs  INDICATION: [name] is a 82 year old male with a long history of BPH and outlet obstruction managed with pharmacologic therapy. He developed urinary retention after kyphoplasty and despite maximum medical management has not been able to void spontaneously. We therefore discussed the treatment options and he has elected to proceed with a transurethral resection of his prostate. I suggested placement of a suprapubic tube due to the fact that he has failed several voiding trials and this will allow for further voiding trials which I told him I thought had a high probability of success and eventual removal of the suprapubic tube.  ANESTHESIA:  General  EBL:  Minimal  DRAINS: 72 French Foley catheter as a suprapubic tube and 20 French Foley catheter per urethra.  SPECIMEN:  Prostate chips to pathology  After informed consent the patient was brought to the major OR and placed on the table. He was administered general anesthesia and then moved to the dorsal lithotomy position. His genitalia was sterilely prepped and draped and an official timeout was then performed.  Initially the 28 French resectoscope sheath with the visual obturator was passed into the bladder and the obturator removed. I then inserted the resectoscope element with 30 lens and  performed a systematic inspection of the bladder. I noted the bladder had 3+ trabeculation but was free of any tumor or inflammatory lesions. Several bladder stones were noted on the floor of the bladder. They ranged in size from less than 1 mm up to approximately 5 mm. The ureteral orifices were noted to be of normal configuration and  position. Withdrawing the scope into the prostatic urethra I noted obstructing bilobar hypertrophy with elongation of the prostatic urethra and a minimal median lobe component.  Through the resectoscope sheath I was able to use the Microvasive evacuator to remove all of the stones from his bladder. Reinspection revealed all stones had been adequately evacuated from the bladder. I then reinserted the resectoscope element and 30 lens.  Resection was then begun. I began resecting the left lobe of the prostate by resecting first from the level of the bladder neck back to the level of the Veru at the 5:00 position and then progressed in a counterclockwise direction resecting all of the adenomatous tissue of the left lobe down to the surgical capsule. Bleeding points were cauterized as they were encountered. I then turned my attention to the right lobe of the prostate and it was resected in an identical fashion. Tissue in the area of the apex was then resected circumferentially with care being taken to maintain the resection proximal to the Veru at all times. The prostatic chips were then flushed into the bladder and the Microvasive evacuator was then used to evacuate all chips from the bladder. Reinspection of the bladder revealed the mucosa to be intact, the ureteral orifices intact as well and well away from the bladder neck and area of resection. There were no prostatic chips remaining within the bladder. The prostatic capsule was intact throughout with no perforation and there was no active bleeding noted at the end of the procedure.  I then placed him in Trendelenburg position and filled the bladder to capacity. Because he is thin I was  easily able to visualize the dome of the bladder which was located up proximally 2 fingerbreadths above the symphysis pubis. A midline incision was made and I used hemostats to spread the subcutaneous tissue. The trocar suprapubic tube introducer was then passed under direct  vision through the dome of the bladder and into the bladder. I removed the inner portion of the suprapubic introducer leaving the sheath and passed a 16 French Foley catheter through this and into the bladder. The peel-away sheath was then removed after I filled the balloon with 10 mL of sterile water. I then injected 10 mL of 1% lidocaine in the subcutaneous tissue and placed a figure-of-eight 3-0 nylon suture in the incision closing this and then securing it to the suprapubic tube as well.  The resectoscope was therefore removed and the 20 French  Foley catheter was then inserted and balloon was filled to 30 cc. This was placed on mild traction and the bladder was irrigated with the irrigant returning clear. The catheter was then hooked to closed system drainage and continuous irrigation was performed per the suprapubic tube and the patient was awakened and taken to recovery room in stable and satisfactory condition. He tolerated the procedure well and there were no intraoperative complications.  PLAN OF CARE: Observation overnight with anticipated discharge in the morning.  PATIENT DISPOSITION:  PACU - Hemodynamically stable.

## 2015-11-05 NOTE — Progress Notes (Signed)
Night of surgery note  Subjective: The patient is doing well.  No complaints other than occasional suprapubic discomfort. Possible bladder spasms.  Objective: Vital signs in last 24 hours: Temp:  [97.7 F (36.5 C)-98.2 F (36.8 C)] 98 F (36.7 C) (09/22 1351) Pulse Rate:  [61-122] 76 (09/22 1351) Resp:  [12-18] 17 (09/22 1351) BP: (105-132)/(64-82) 126/75 (09/22 1351) SpO2:  [97 %-100 %] 99 % (09/22 1351) Weight:  [131 lb (59.4 kg)] 131 lb (59.4 kg) (09/22 0732)  Intake/Output this shift: Total I/O In: 3600 [I.V.:600; Other:3000] Out: 4600 [Urine:4600]  Physical Exam:  General: Alert and oriented. Abdomen: Soft, Nondistended. SP site: Clean with dry, intact dressing. Foley catheter draining pink urine with no clots on CBI  Lab Results: No results for input(s): HGB, HCT in the last 72 hours.  Assessment/Plan: 1) Continue to monitor 2) Per orders   Abdikadir Fohl C. Karsten Ro, MD  Claybon Jabs 11/05/2015, 2:44 PM

## 2015-11-05 NOTE — H&P (Signed)
HPI: [name] is a 82 year-old male established patient who is here for urinary retention.  His problem was diagnosed 08/15/2015. His current symptoms did begin after he had a surgical procedure. The surgery he had done was Kyphoplasty. He currently has an indwelling catheter. The catheter has been in his bladder for approximately 09/02/2015. His urinary retention is being treated with foley catheter and uroxatrol. Patient denies suprapubic tube, intermittent catheterization, Flomax, Hytrin, Cardura, rapaflo, avodart, and Proscar.   He has not previously had an indwelling catheter in for more than two weeks at a time.   The catheter does bother him somewhat. He remains on Uroxatral.  His urinary retention remains moderately severe with nomodifying factors or associated signs and symptoms.     ALLERGIES: Cipro TABS Sulfa Drugs Tramadol    MEDICATIONS: Levothyroxine Sodium 1 PO Daily  Metoprolol Tartrate 25 mg tablet 1 tablet PO Daily  Acetaminophen 325 mg tablet 1 tablet PO Daily  Alfuzosin Hcl Er 10 mg tablet, extended release 24 hr 1 tablet PO Daily  Multivitamin 1 PO Daily  Naproxen Sodium 220 mg tablet 1 tablet PO Daily  Systane 1 PO Daily  Travatan Z 0.004 % drops 1 PO Daily     GU PSH: Cysto Uretero Remove Stone - 2009 Prostate Needle Biopsy - 2009 Renal ESWL - 2009 Simple Scrotal Orchiectomy - 2008    NON-GU PSH: Percut Kyphoplasty, Lumbar - 08/13/2015    GU PMH: Urinary Retention (Worsening), He was unsuccessful with his voiding trial today. We discussed the options and he is going to return in 4 weeks for repeat voiding trial. - 09/09/2015, Urinary Retention (Stable) - 08/31/2015, (Acute), - 08/24/2015 Male ED, unspecified, Erectile dysfunction - 2015 BPH w/LUTS, Benign prostatic hyperplasia with urinary obstruction - 2014 Chronic prostatitis, Prostatitis, chronic - 2014 Kidney Stone, Kidney stone on left side - 2014 Overactive bladder, Overactive bladder - 2014 Scrotal  varices, Varicocele - 2014 Spermatocele of epididymis, Unspec, Spermatocele - 2014 Testicular pain, unspecified, Testicular pain - 2014 Urinary Retention, Unspec, Acute Urinary Retention - 2014 Elevated PSA, Elevated prostate specific antigen (PSA) - 1998      PMH Notes: He has a long history of BPH with bladder outlet obstruction. This has been managed with both Uroxatral and Cardura which he uses on an alternating basis.    He's had a history of kidney stones in 1/94 he underwent lithotripsy and then had to have a stone extracted from the ureter in 3/94.    He has a history of an elevated PSA and in 11/98 underwent a biopsy and at that time his prostate measured 26 cc and the pathology revealed BPH only.  10/15 - 9.97  5/16 - 9.43   His biggest problem is a long-term history of intermittent chronic prostatitis. It is not causing any difficulty currently however.    He has organic erectile dysfunction for which he uses Viagra.     NON-GU PMH: Abnormal weight loss, Weight loss, unintentional - 07/09/2014 Encounter for general adult medical examination without abnormal findings, Encounter for preventive health examination - 07/09/2014 Hypertrophy of breast, Hypertrophy of breast - 2014 Personal history of other diseases of the digestive system, History of esophageal reflux - 2014 Personal history of other diseases of the nervous system and sense organs, History of sleep apnea - 2014 Pigmentary retinal dystrophy, Retinitis Pigmentosa - 2014    FAMILY HISTORY: Family Health Status Number - Runs In Family No pertinent family history - Other   SOCIAL HISTORY: Marital  Status: Married Current Smoking Status: Patient does not smoke anymore.  Has never drank.  Patient's occupation is/was retired.     Notes: Former smoker, Occupation:, Tobacco Use, Marital History - Currently Married, Alcohol Use, Caffeine Use, Death In The Family Mother, Death In The Family Father   REVIEW OF SYSTEMS:     GU Review Male:   Patient reports penile pain. Patient denies frequent urination, hard to postpone urination, burning/ pain with urination, get up at night to urinate, leakage of urine, stream starts and stops, trouble starting your stream, have to strain to urinate , and erection problems.  Gastrointestinal (Upper):   Patient denies nausea, vomiting, and indigestion/ heartburn.  Gastrointestinal (Lower):   Patient denies diarrhea and constipation.  Constitutional:   Patient denies fever, night sweats, weight loss, and fatigue.  Skin:   Patient denies skin rash/ lesion and itching.  Eyes:   Patient denies blurred vision and double vision.  Ears/ Nose/ Throat:   Patient denies sore throat and sinus problems.  Hematologic/Lymphatic:   Patient denies swollen glands and easy bruising.  Cardiovascular:   Patient denies leg swelling and chest pains.  Respiratory:   Patient denies cough and shortness of breath.  Endocrine:   Patient denies excessive thirst.  Musculoskeletal:   Patient denies back pain and joint pain.  Neurological:   Patient denies headaches and dizziness.  Psychologic:   Patient denies depression and anxiety.   VITAL SIGNS:    Weight 127 lb / 57.61 kg  Height 70 in / 177.8 cm  BP 119/67 mmHg  Pulse 90 /min  BMI 18.2 kg/m   GU PHYSICAL EXAMINATION:    Scrotum: No lesions. No edema. No cysts. No warts.  Epididymides: Right: no spermatocele, no masses, no cysts, no tenderness, no induration, no enlargement. Left: no spermatocele, no masses, no cysts, no tenderness, no induration, no enlargement.  Testes: No tenderness, no swelling, no enlargement left testes. No tenderness, no swelling, no enlargement right testes. Normal location left testes. Normal location right testes. No mass, no cyst, no varicocele, no hydrocele left testes. No mass, no cyst, no varicocele, no hydrocele right testes.  Urethral Meatus: Normal size. No lesion, no wart, no discharge, no polyp. Normal location  with Foley catheter indwelling.  Penis: Circumcised, no warts, no cracks. No dorsal Peyronie's plaques, no left corporal Peyronie's plaques, no right corporal Peyronie's plaques, no scarring, no warts. No balanitis, no meatal stenosis.   MULTI-SYSTEM PHYSICAL EXAMINATION:    Constitutional: Well-nourished. No physical deformities. Normally developed. Good grooming.  Neck: Neck symmetrical, not swollen. Normal tracheal position.  Respiratory: No labored breathing, no use of accessory muscles.   Cardiovascular: Normal temperature, normal extremity pulses, no swelling, no varicosities.  Lymphatic: No enlargement of neck, axillae, groin.  Skin: No paleness, no jaundice, no cyanosis. No lesion, no ulcer, no rash.  Neurologic / Psychiatric: Oriented to time, oriented to place, oriented to person. No depression, no anxiety, no agitation.  Gastrointestinal: No mass, no tenderness, no rigidity, non obese abdomen.  Eyes: Normal conjunctivae. Normal eyelids.  Ears, Nose, Mouth, and Throat: Left ear no scars, no lesions, no masses. Right ear no scars, no lesions, no masses. Nose no scars, no lesions, no masses. Normal hearing. Normal lips.  Musculoskeletal: Normal gait and station of head and neck.    PAST DATA REVIEWED:  Source Of History:  Patient  Records Review:   Previous Patient Records   07/10/14 04/23/12 04/26/11 04/19/10 10/19/09 04/20/09 10/20/08 04/16/08  PSA  Total  PSA 9.43  8.66  7.66  8.15  9.66  9.01  9.85  8.45   Free PSA 1.28  1.10  1.06  1.0  0.84  0.92  0.93  0.97   % Free PSA 14  13  14  12   8.7  10.2  9.4  11.5     ASSESSMENT:      ICD-10 Details  1 GU:   Urinary Retention  I discussed with him today the fact that he had been urinating prior to going into urinary retention and has a history of outlet obstruction but it sounds like he may have had some degree of stretch injury to the bladder and therefore I typically would obtain urodynamics before proceeding with outlet  resistance surgery. In his case however because Medicare will not pay for this and he cannot afford it I have discussed proceeding with transurethral resection of the prostate and have discussed the procedure with him in detail today. We did discuss placing a suprapubic tube as an option in case he was unable to urinate after undergoing his transurethral resection if this was necessary. He has opted to undergo further trial of voiding which was unsuccessful.  He returned having attempted to urinate but has been unable. He was uncomfortable. He had over 500 cc in his bladder by ultrasound. A new Foley catheter was inserted.   We discussed the options and he has elected to proceed with a transurethral resection of his prostate and suprapubic tube placement at the time of surgery. He's had a previous right inguinal hernia repair with mesh with no intra-abdominal surgeries otherwise.   A urine culture was obtained from his catheter and he will be scheduled for surgery.    A preop urine culture was obtained and was positive.  He was treated accordingly sensitivities and has remained on antibiotics.  PLAN:    TURP and suprapubic tube placement.

## 2015-11-05 NOTE — Anesthesia Procedure Notes (Signed)
Procedure Name: LMA Insertion Date/Time: 11/05/2015 9:48 AM Performed by: Glory Buff Pre-anesthesia Checklist: Patient identified, Emergency Drugs available, Suction available and Patient being monitored Patient Re-evaluated:Patient Re-evaluated prior to inductionOxygen Delivery Method: Circle system utilized Preoxygenation: Pre-oxygenation with 100% oxygen Intubation Type: IV induction LMA: LMA inserted LMA Size: 4.0 Number of attempts: 1 Placement Confirmation: CO2 detector Tube secured with: Tape Dental Injury: Teeth and Oropharynx as per pre-operative assessment

## 2015-11-06 DIAGNOSIS — N401 Enlarged prostate with lower urinary tract symptoms: Secondary | ICD-10-CM | POA: Diagnosis not present

## 2015-11-06 DIAGNOSIS — N138 Other obstructive and reflux uropathy: Secondary | ICD-10-CM | POA: Diagnosis not present

## 2015-11-06 DIAGNOSIS — R338 Other retention of urine: Secondary | ICD-10-CM | POA: Diagnosis not present

## 2015-11-06 DIAGNOSIS — N3289 Other specified disorders of bladder: Secondary | ICD-10-CM | POA: Diagnosis not present

## 2015-11-06 DIAGNOSIS — C61 Malignant neoplasm of prostate: Secondary | ICD-10-CM | POA: Diagnosis not present

## 2015-11-06 DIAGNOSIS — N210 Calculus in bladder: Secondary | ICD-10-CM | POA: Diagnosis not present

## 2015-11-06 NOTE — Discharge Instructions (Signed)
Post transurethral resection of the prostate (TURP) instructions  Your recent prostate surgery requires very special post hospital care. Despite the fact that no skin incisions were used the area around the prostate incision is quite raw and is covered with a scab to promote healing and prevent bleeding. Certain cautions are needed to assure that the scab is not disturbed of the next 2-3 weeks while the healing proceeds.  Because the raw surface in your prostate and the irritating effects of urine you may expect frequency of urination and/or urgency (a stronger desire to urinate) and perhaps even getting up at night more often. This will usually resolve or improve slowly over the healing period. You may see some blood in your urine over the first 6 weeks. Do not be alarmed, even if the urine was clear for a while. Get off your feet and drink lots of fluids until clearing occurs. If you start to pass clots or don't improve call us.  Catheter: (If you are discharged with a catheter.) 1 - You may connect the suprapubic catheter to drainage bag if you are unable to urinate.  2 - It is OK to shower / bath with the suprapubic catheter, it can get wet / soapy.   Diet:  You may return to your normal diet immediately. Because of the raw surface of your bladder, alcohol, spicy foods, foods high in acid and drinks with caffeine may cause irritation or frequency and should be used in moderation. To keep your urine flowing freely and avoid constipation, drink plenty of fluids during the day (8-10 glasses). Tip: Avoid cranberry juice because it is very acidic.  Activity:  Your physical activity doesn't need to be restricted. However, if you are very active, you may see some blood in the urine. We suggest that you reduce your activity under the circumstances until the bleeding has stopped.  Bowels:  It is important to keep your bowels regular during the postoperative period. Straining with bowel movements can  cause bleeding. A bowel movement every other day is reasonable. Use a mild laxative if needed, such as milk of magnesia 2-3 tablespoons, or 2 Dulcolax tablets. Call if you continue to have problems. If you had been taking narcotics for pain, before, during or after your surgery, you may be constipated. Take a laxative if necessary.  Medication:  You should resume your pre-surgery medications unless told not to. DO NOT RESUME YOUR ASPIRIN, WARFARIN, OR OTHER BLOOD THINNER FOR 1 WEEK. In addition you may be given an antibiotic to prevent or treat infection. Antibiotics are not always necessary. All medication should be taken as prescribed until the bottles are finished unless you are having an unusual reaction to one of the drugs.     Problems you should report to [place]:  a. Fever greater than 101F. b. Heavy bleeding, or clots (see notes above about blood in urine). c. Inability to urinate. d. Drug reactions (hives, rash, nausea, vomiting, diarrhea). e. Severe burning or pain with urination that is not improving.

## 2015-11-06 NOTE — Discharge Summary (Signed)
Physician Discharge Summary  Patient ID: [name] MRN: [id number] DOB/AGE: March 02, 1925 82 y.o.  Admit date: 11/05/2015 Discharge date: 11/06/2015  Admission Diagnoses: Prostatic Hypertrophy with Refractory Urinary Retention  Discharge Diagnoses:  Active Problems:   BPH (benign prostatic hypertrophy) with urinary retention   Discharged Condition: good  Hospital Course:   Prostatic Hypertrophy with Refractory Urinary Retention - underwent transurethral resection of prostate and SP tube placement on 9/22, the day of admission, without acute complications. Admitted to 4th floor Urology service post-op. Initially managed on bladder irrigation which was titrated to off. By mid AM POD 1, he is voiding again without clots and felt to be adequate for discharge. He will keep SP tube clamped and be sent home with foley bag that he can connect it to should urinary retention re-develop.  Consults: None  Treatments: surgery: TURP  Discharge Exam: Blood pressure 112/66, pulse 84, temperature 98.4 F (36.9 C), temperature source Oral, resp. rate 18, height 5\' 10"  (1.778 m), weight 59.4 kg (131 lb), SpO2 94 %. General appearance: alert, cooperative and appears stated age Eyes: negative Nose: Nares normal. Septum midline. Mucosa normal. No drainage or sinus tenderness. Throat: lips, mucosa, and tongue normal; teeth and gums normal Back: symmetric, no curvature. ROM normal. No CVA tenderness. Resp: non-labored on room air Cardio: Nl rate GI: soft, non-tender; bowel sounds normal; no masses,  no organomegaly Male genitalia: normal, SPT in palce, capped, c/d/i. NO urethral catheter. No palpably distended bladder. NO phimosis / paraphimosis.  Extremities: extremities normal, atraumatic, no cyanosis or edema Pulses: 2+ and symmetric Lymph nodes: Cervical, supraclavicular, and axillary nodes normal. Neurologic: Mental status: stigmata of mild dementia, but AOx3.   Disposition: 01-Home or Self  Care     Medication List    STOP taking these medications   alfuzosin 10 MG 24 hr tablet Commonly known as:  UROXATRAL   doxycycline 100 MG capsule Commonly known as:  VIBRAMYCIN     TAKE these medications   acetaminophen 325 MG tablet Commonly known as:  TYLENOL Take 2 tablets (650 mg total) by mouth every 6 (six) hours as needed for mild pain (or Fever >/= 101).   feeding supplement (ENSURE ENLIVE) Liqd Take 237 mLs by mouth daily at 3 pm. What changed:  when to take this   HYDROcodone-acetaminophen 7.5-325 MG tablet Commonly known as:  NORCO Take 1-2 tablets by mouth every 4 (four) hours as needed for moderate pain. Maximum dose per 24 hours - 8 pills   levothyroxine 25 MCG tablet Commonly known as:  SYNTHROID, LEVOTHROID Take 25 mcg by mouth daily before breakfast.   multivitamin with minerals Tabs tablet Take 1 tablet by mouth daily.   polyethylene glycol packet Commonly known as:  MIRALAX / GLYCOLAX Take 17 g by mouth daily as needed. For constipation.   senna 8.6 MG tablet Commonly known as:  SENOKOT Take 1-2 tablets by mouth daily as needed for constipation.   TRAVATAN Z 0.004 % Soln ophthalmic solution Generic drug:  Travoprost (BAK Free) Place 1 drop into both eyes at bedtime.   trimethoprim 100 MG tablet Commonly known as:  TRIMPEX Take 1 tablet (100 mg total) by mouth 2 (two) times daily.      [facility] On 11/12/2015.   Why:  For your appiontment at 10:15 Contact information: [place] [id number] [phone]           Signed: Alexis Frock  11/06/2015, 6:50 AM

## 2015-11-16 DIAGNOSIS — R310 Gross hematuria: Secondary | ICD-10-CM | POA: Diagnosis not present

## 2015-12-07 DIAGNOSIS — D649 Anemia, unspecified: Secondary | ICD-10-CM | POA: Diagnosis not present

## 2015-12-07 DIAGNOSIS — E039 Hypothyroidism, unspecified: Secondary | ICD-10-CM | POA: Diagnosis not present

## 2015-12-07 DIAGNOSIS — D0439 Carcinoma in situ of skin of other parts of face: Secondary | ICD-10-CM | POA: Diagnosis not present

## 2015-12-07 DIAGNOSIS — Z79899 Other long term (current) drug therapy: Secondary | ICD-10-CM | POA: Diagnosis not present

## 2015-12-07 DIAGNOSIS — L821 Other seborrheic keratosis: Secondary | ICD-10-CM | POA: Diagnosis not present

## 2015-12-14 DIAGNOSIS — H18422 Band keratopathy, left eye: Secondary | ICD-10-CM | POA: Diagnosis not present

## 2015-12-17 DIAGNOSIS — H18422 Band keratopathy, left eye: Secondary | ICD-10-CM | POA: Diagnosis not present

## 2016-01-14 DIAGNOSIS — H401112 Primary open-angle glaucoma, right eye, moderate stage: Secondary | ICD-10-CM | POA: Diagnosis not present

## 2016-01-14 DIAGNOSIS — H401122 Primary open-angle glaucoma, left eye, moderate stage: Secondary | ICD-10-CM | POA: Diagnosis not present

## 2016-01-14 DIAGNOSIS — H18422 Band keratopathy, left eye: Secondary | ICD-10-CM | POA: Diagnosis not present

## 2016-01-19 DIAGNOSIS — L308 Other specified dermatitis: Secondary | ICD-10-CM | POA: Diagnosis not present

## 2016-01-19 DIAGNOSIS — Z85828 Personal history of other malignant neoplasm of skin: Secondary | ICD-10-CM | POA: Diagnosis not present

## 2016-01-19 DIAGNOSIS — Z08 Encounter for follow-up examination after completed treatment for malignant neoplasm: Secondary | ICD-10-CM | POA: Diagnosis not present

## 2016-01-19 DIAGNOSIS — B351 Tinea unguium: Secondary | ICD-10-CM | POA: Diagnosis not present

## 2016-01-25 DIAGNOSIS — Z0000 Encounter for general adult medical examination without abnormal findings: Secondary | ICD-10-CM | POA: Diagnosis not present

## 2016-01-25 DIAGNOSIS — Z7189 Other specified counseling: Secondary | ICD-10-CM | POA: Diagnosis not present

## 2016-01-25 DIAGNOSIS — Z1389 Encounter for screening for other disorder: Secondary | ICD-10-CM | POA: Diagnosis not present

## 2016-03-30 DIAGNOSIS — H33012 Retinal detachment with single break, left eye: Secondary | ICD-10-CM | POA: Diagnosis not present

## 2016-04-13 DIAGNOSIS — Z961 Presence of intraocular lens: Secondary | ICD-10-CM | POA: Diagnosis not present

## 2016-04-13 DIAGNOSIS — H401132 Primary open-angle glaucoma, bilateral, moderate stage: Secondary | ICD-10-CM | POA: Diagnosis not present

## 2016-04-13 DIAGNOSIS — H18422 Band keratopathy, left eye: Secondary | ICD-10-CM | POA: Diagnosis not present

## 2016-08-07 DIAGNOSIS — Z77122 Contact with and (suspected) exposure to noise: Secondary | ICD-10-CM | POA: Diagnosis not present

## 2016-08-07 DIAGNOSIS — H903 Sensorineural hearing loss, bilateral: Secondary | ICD-10-CM | POA: Diagnosis not present

## 2016-08-11 DIAGNOSIS — H401132 Primary open-angle glaucoma, bilateral, moderate stage: Secondary | ICD-10-CM | POA: Diagnosis not present

## 2016-10-05 DIAGNOSIS — H33012 Retinal detachment with single break, left eye: Secondary | ICD-10-CM | POA: Diagnosis not present

## 2016-10-05 DIAGNOSIS — H43811 Vitreous degeneration, right eye: Secondary | ICD-10-CM | POA: Diagnosis not present

## 2016-10-23 DIAGNOSIS — H401132 Primary open-angle glaucoma, bilateral, moderate stage: Secondary | ICD-10-CM | POA: Diagnosis not present

## 2016-11-17 DIAGNOSIS — B351 Tinea unguium: Secondary | ICD-10-CM | POA: Diagnosis not present

## 2016-11-17 DIAGNOSIS — L821 Other seborrheic keratosis: Secondary | ICD-10-CM | POA: Diagnosis not present

## 2016-11-17 DIAGNOSIS — L570 Actinic keratosis: Secondary | ICD-10-CM | POA: Diagnosis not present

## 2016-11-17 DIAGNOSIS — X32XXXD Exposure to sunlight, subsequent encounter: Secondary | ICD-10-CM | POA: Diagnosis not present

## 2016-11-17 DIAGNOSIS — C44319 Basal cell carcinoma of skin of other parts of face: Secondary | ICD-10-CM | POA: Diagnosis not present

## 2016-11-20 DIAGNOSIS — E039 Hypothyroidism, unspecified: Secondary | ICD-10-CM | POA: Diagnosis not present

## 2016-11-20 DIAGNOSIS — R5383 Other fatigue: Secondary | ICD-10-CM | POA: Diagnosis not present

## 2016-11-23 DIAGNOSIS — H401132 Primary open-angle glaucoma, bilateral, moderate stage: Secondary | ICD-10-CM | POA: Diagnosis not present

## 2016-12-26 ENCOUNTER — Other Ambulatory Visit: Payer: Self-pay | Admitting: Internal Medicine

## 2016-12-26 ENCOUNTER — Ambulatory Visit
Admission: RE | Admit: 2016-12-26 | Discharge: 2016-12-26 | Disposition: A | Discharge status: Home / Self Care | Payer: Medicare Other | Source: Ambulatory Visit | Attending: Internal Medicine | Admitting: Internal Medicine

## 2016-12-26 DIAGNOSIS — R079 Chest pain, unspecified: Secondary | ICD-10-CM | POA: Diagnosis not present

## 2016-12-26 DIAGNOSIS — R0781 Pleurodynia: Secondary | ICD-10-CM

## 2016-12-26 DIAGNOSIS — S299XXA Unspecified injury of thorax, initial encounter: Secondary | ICD-10-CM | POA: Diagnosis not present

## 2016-12-26 DIAGNOSIS — S51011A Laceration without foreign body of right elbow, initial encounter: Secondary | ICD-10-CM | POA: Diagnosis not present

## 2016-12-26 DIAGNOSIS — W19XXXA Unspecified fall, initial encounter: Secondary | ICD-10-CM | POA: Diagnosis not present

## 2016-12-26 DIAGNOSIS — L089 Local infection of the skin and subcutaneous tissue, unspecified: Secondary | ICD-10-CM | POA: Diagnosis not present

## 2017-01-30 DIAGNOSIS — E039 Hypothyroidism, unspecified: Secondary | ICD-10-CM | POA: Diagnosis not present

## 2017-01-30 DIAGNOSIS — Z1389 Encounter for screening for other disorder: Secondary | ICD-10-CM | POA: Diagnosis not present

## 2017-01-30 DIAGNOSIS — R269 Unspecified abnormalities of gait and mobility: Secondary | ICD-10-CM | POA: Diagnosis not present

## 2017-01-30 DIAGNOSIS — Z0000 Encounter for general adult medical examination without abnormal findings: Secondary | ICD-10-CM | POA: Diagnosis not present

## 2017-02-14 DIAGNOSIS — M1990 Unspecified osteoarthritis, unspecified site: Secondary | ICD-10-CM | POA: Diagnosis not present

## 2017-02-14 DIAGNOSIS — Z7982 Long term (current) use of aspirin: Secondary | ICD-10-CM | POA: Diagnosis not present

## 2017-02-14 DIAGNOSIS — F419 Anxiety disorder, unspecified: Secondary | ICD-10-CM | POA: Diagnosis not present

## 2017-02-14 DIAGNOSIS — Z9181 History of falling: Secondary | ICD-10-CM | POA: Diagnosis not present

## 2017-02-14 DIAGNOSIS — H9193 Unspecified hearing loss, bilateral: Secondary | ICD-10-CM | POA: Diagnosis not present

## 2017-02-14 DIAGNOSIS — M8580 Other specified disorders of bone density and structure, unspecified site: Secondary | ICD-10-CM | POA: Diagnosis not present

## 2017-02-14 DIAGNOSIS — Z8781 Personal history of (healed) traumatic fracture: Secondary | ICD-10-CM | POA: Diagnosis not present

## 2017-02-14 DIAGNOSIS — H409 Unspecified glaucoma: Secondary | ICD-10-CM | POA: Diagnosis not present

## 2017-02-16 DIAGNOSIS — H409 Unspecified glaucoma: Secondary | ICD-10-CM | POA: Diagnosis not present

## 2017-02-16 DIAGNOSIS — M1990 Unspecified osteoarthritis, unspecified site: Secondary | ICD-10-CM | POA: Diagnosis not present

## 2017-02-16 DIAGNOSIS — H9193 Unspecified hearing loss, bilateral: Secondary | ICD-10-CM | POA: Diagnosis not present

## 2017-02-16 DIAGNOSIS — F419 Anxiety disorder, unspecified: Secondary | ICD-10-CM | POA: Diagnosis not present

## 2017-02-16 DIAGNOSIS — Z9181 History of falling: Secondary | ICD-10-CM | POA: Diagnosis not present

## 2017-02-16 DIAGNOSIS — M8580 Other specified disorders of bone density and structure, unspecified site: Secondary | ICD-10-CM | POA: Diagnosis not present

## 2017-02-19 DIAGNOSIS — H409 Unspecified glaucoma: Secondary | ICD-10-CM | POA: Diagnosis not present

## 2017-02-19 DIAGNOSIS — M1990 Unspecified osteoarthritis, unspecified site: Secondary | ICD-10-CM | POA: Diagnosis not present

## 2017-02-19 DIAGNOSIS — M8580 Other specified disorders of bone density and structure, unspecified site: Secondary | ICD-10-CM | POA: Diagnosis not present

## 2017-02-19 DIAGNOSIS — H9193 Unspecified hearing loss, bilateral: Secondary | ICD-10-CM | POA: Diagnosis not present

## 2017-02-19 DIAGNOSIS — F419 Anxiety disorder, unspecified: Secondary | ICD-10-CM | POA: Diagnosis not present

## 2017-02-19 DIAGNOSIS — Z9181 History of falling: Secondary | ICD-10-CM | POA: Diagnosis not present

## 2017-02-22 DIAGNOSIS — H409 Unspecified glaucoma: Secondary | ICD-10-CM | POA: Diagnosis not present

## 2017-02-22 DIAGNOSIS — H9193 Unspecified hearing loss, bilateral: Secondary | ICD-10-CM | POA: Diagnosis not present

## 2017-02-22 DIAGNOSIS — M1990 Unspecified osteoarthritis, unspecified site: Secondary | ICD-10-CM | POA: Diagnosis not present

## 2017-02-22 DIAGNOSIS — Z9181 History of falling: Secondary | ICD-10-CM | POA: Diagnosis not present

## 2017-02-22 DIAGNOSIS — F419 Anxiety disorder, unspecified: Secondary | ICD-10-CM | POA: Diagnosis not present

## 2017-02-22 DIAGNOSIS — M8580 Other specified disorders of bone density and structure, unspecified site: Secondary | ICD-10-CM | POA: Diagnosis not present

## 2017-02-23 DIAGNOSIS — H401132 Primary open-angle glaucoma, bilateral, moderate stage: Secondary | ICD-10-CM | POA: Diagnosis not present

## 2017-02-27 DIAGNOSIS — H409 Unspecified glaucoma: Secondary | ICD-10-CM | POA: Diagnosis not present

## 2017-02-27 DIAGNOSIS — M1990 Unspecified osteoarthritis, unspecified site: Secondary | ICD-10-CM | POA: Diagnosis not present

## 2017-02-27 DIAGNOSIS — M8580 Other specified disorders of bone density and structure, unspecified site: Secondary | ICD-10-CM | POA: Diagnosis not present

## 2017-02-27 DIAGNOSIS — Z9181 History of falling: Secondary | ICD-10-CM | POA: Diagnosis not present

## 2017-02-27 DIAGNOSIS — F419 Anxiety disorder, unspecified: Secondary | ICD-10-CM | POA: Diagnosis not present

## 2017-02-27 DIAGNOSIS — H9193 Unspecified hearing loss, bilateral: Secondary | ICD-10-CM | POA: Diagnosis not present

## 2017-03-01 DIAGNOSIS — F419 Anxiety disorder, unspecified: Secondary | ICD-10-CM | POA: Diagnosis not present

## 2017-03-01 DIAGNOSIS — H9193 Unspecified hearing loss, bilateral: Secondary | ICD-10-CM | POA: Diagnosis not present

## 2017-03-01 DIAGNOSIS — M8580 Other specified disorders of bone density and structure, unspecified site: Secondary | ICD-10-CM | POA: Diagnosis not present

## 2017-03-01 DIAGNOSIS — H409 Unspecified glaucoma: Secondary | ICD-10-CM | POA: Diagnosis not present

## 2017-03-01 DIAGNOSIS — M1990 Unspecified osteoarthritis, unspecified site: Secondary | ICD-10-CM | POA: Diagnosis not present

## 2017-03-01 DIAGNOSIS — Z9181 History of falling: Secondary | ICD-10-CM | POA: Diagnosis not present

## 2017-03-06 DIAGNOSIS — M8580 Other specified disorders of bone density and structure, unspecified site: Secondary | ICD-10-CM | POA: Diagnosis not present

## 2017-03-06 DIAGNOSIS — F419 Anxiety disorder, unspecified: Secondary | ICD-10-CM | POA: Diagnosis not present

## 2017-03-06 DIAGNOSIS — Z9181 History of falling: Secondary | ICD-10-CM | POA: Diagnosis not present

## 2017-03-06 DIAGNOSIS — H409 Unspecified glaucoma: Secondary | ICD-10-CM | POA: Diagnosis not present

## 2017-03-06 DIAGNOSIS — M1990 Unspecified osteoarthritis, unspecified site: Secondary | ICD-10-CM | POA: Diagnosis not present

## 2017-03-06 DIAGNOSIS — H9193 Unspecified hearing loss, bilateral: Secondary | ICD-10-CM | POA: Diagnosis not present

## 2017-03-08 DIAGNOSIS — H9193 Unspecified hearing loss, bilateral: Secondary | ICD-10-CM | POA: Diagnosis not present

## 2017-03-08 DIAGNOSIS — M8580 Other specified disorders of bone density and structure, unspecified site: Secondary | ICD-10-CM | POA: Diagnosis not present

## 2017-03-08 DIAGNOSIS — F419 Anxiety disorder, unspecified: Secondary | ICD-10-CM | POA: Diagnosis not present

## 2017-03-08 DIAGNOSIS — Z9181 History of falling: Secondary | ICD-10-CM | POA: Diagnosis not present

## 2017-03-08 DIAGNOSIS — H409 Unspecified glaucoma: Secondary | ICD-10-CM | POA: Diagnosis not present

## 2017-03-08 DIAGNOSIS — M1990 Unspecified osteoarthritis, unspecified site: Secondary | ICD-10-CM | POA: Diagnosis not present

## 2017-03-13 DIAGNOSIS — Z9181 History of falling: Secondary | ICD-10-CM | POA: Diagnosis not present

## 2017-03-13 DIAGNOSIS — H409 Unspecified glaucoma: Secondary | ICD-10-CM | POA: Diagnosis not present

## 2017-03-13 DIAGNOSIS — F419 Anxiety disorder, unspecified: Secondary | ICD-10-CM | POA: Diagnosis not present

## 2017-03-13 DIAGNOSIS — M1990 Unspecified osteoarthritis, unspecified site: Secondary | ICD-10-CM | POA: Diagnosis not present

## 2017-03-13 DIAGNOSIS — M8580 Other specified disorders of bone density and structure, unspecified site: Secondary | ICD-10-CM | POA: Diagnosis not present

## 2017-03-13 DIAGNOSIS — H9193 Unspecified hearing loss, bilateral: Secondary | ICD-10-CM | POA: Diagnosis not present

## 2017-03-14 DIAGNOSIS — M1990 Unspecified osteoarthritis, unspecified site: Secondary | ICD-10-CM | POA: Diagnosis not present

## 2017-03-14 DIAGNOSIS — H409 Unspecified glaucoma: Secondary | ICD-10-CM | POA: Diagnosis not present

## 2017-03-14 DIAGNOSIS — F419 Anxiety disorder, unspecified: Secondary | ICD-10-CM | POA: Diagnosis not present

## 2017-03-14 DIAGNOSIS — Z9181 History of falling: Secondary | ICD-10-CM | POA: Diagnosis not present

## 2017-03-14 DIAGNOSIS — H9193 Unspecified hearing loss, bilateral: Secondary | ICD-10-CM | POA: Diagnosis not present

## 2017-03-14 DIAGNOSIS — M8580 Other specified disorders of bone density and structure, unspecified site: Secondary | ICD-10-CM | POA: Diagnosis not present

## 2017-03-20 DIAGNOSIS — F419 Anxiety disorder, unspecified: Secondary | ICD-10-CM | POA: Diagnosis not present

## 2017-03-20 DIAGNOSIS — H9193 Unspecified hearing loss, bilateral: Secondary | ICD-10-CM | POA: Diagnosis not present

## 2017-03-20 DIAGNOSIS — Z9181 History of falling: Secondary | ICD-10-CM | POA: Diagnosis not present

## 2017-03-20 DIAGNOSIS — M1990 Unspecified osteoarthritis, unspecified site: Secondary | ICD-10-CM | POA: Diagnosis not present

## 2017-03-20 DIAGNOSIS — M8580 Other specified disorders of bone density and structure, unspecified site: Secondary | ICD-10-CM | POA: Diagnosis not present

## 2017-03-20 DIAGNOSIS — H409 Unspecified glaucoma: Secondary | ICD-10-CM | POA: Diagnosis not present

## 2017-03-22 DIAGNOSIS — Z9181 History of falling: Secondary | ICD-10-CM | POA: Diagnosis not present

## 2017-03-22 DIAGNOSIS — H9193 Unspecified hearing loss, bilateral: Secondary | ICD-10-CM | POA: Diagnosis not present

## 2017-03-22 DIAGNOSIS — M8580 Other specified disorders of bone density and structure, unspecified site: Secondary | ICD-10-CM | POA: Diagnosis not present

## 2017-03-22 DIAGNOSIS — F419 Anxiety disorder, unspecified: Secondary | ICD-10-CM | POA: Diagnosis not present

## 2017-03-22 DIAGNOSIS — M1990 Unspecified osteoarthritis, unspecified site: Secondary | ICD-10-CM | POA: Diagnosis not present

## 2017-03-22 DIAGNOSIS — H409 Unspecified glaucoma: Secondary | ICD-10-CM | POA: Diagnosis not present

## 2017-03-27 DIAGNOSIS — H9193 Unspecified hearing loss, bilateral: Secondary | ICD-10-CM | POA: Diagnosis not present

## 2017-03-27 DIAGNOSIS — M1990 Unspecified osteoarthritis, unspecified site: Secondary | ICD-10-CM | POA: Diagnosis not present

## 2017-03-27 DIAGNOSIS — Z9181 History of falling: Secondary | ICD-10-CM | POA: Diagnosis not present

## 2017-03-27 DIAGNOSIS — H409 Unspecified glaucoma: Secondary | ICD-10-CM | POA: Diagnosis not present

## 2017-03-27 DIAGNOSIS — F419 Anxiety disorder, unspecified: Secondary | ICD-10-CM | POA: Diagnosis not present

## 2017-03-27 DIAGNOSIS — M8580 Other specified disorders of bone density and structure, unspecified site: Secondary | ICD-10-CM | POA: Diagnosis not present

## 2017-03-30 DIAGNOSIS — M1990 Unspecified osteoarthritis, unspecified site: Secondary | ICD-10-CM | POA: Diagnosis not present

## 2017-03-30 DIAGNOSIS — H409 Unspecified glaucoma: Secondary | ICD-10-CM | POA: Diagnosis not present

## 2017-03-30 DIAGNOSIS — F419 Anxiety disorder, unspecified: Secondary | ICD-10-CM | POA: Diagnosis not present

## 2017-03-30 DIAGNOSIS — H9193 Unspecified hearing loss, bilateral: Secondary | ICD-10-CM | POA: Diagnosis not present

## 2017-03-30 DIAGNOSIS — Z9181 History of falling: Secondary | ICD-10-CM | POA: Diagnosis not present

## 2017-03-30 DIAGNOSIS — M8580 Other specified disorders of bone density and structure, unspecified site: Secondary | ICD-10-CM | POA: Diagnosis not present

## 2017-04-03 DIAGNOSIS — H33012 Retinal detachment with single break, left eye: Secondary | ICD-10-CM | POA: Diagnosis not present

## 2017-04-03 DIAGNOSIS — H43811 Vitreous degeneration, right eye: Secondary | ICD-10-CM | POA: Diagnosis not present

## 2017-04-23 IMAGING — DX DG CHEST 2V
2 series · 2 of 2 positions shown · non-contrast
Comparison: No recent exam.  Radiograph 07/12/2006 reviewed.

CLINICAL DATA: Pain, fall, hematoma to right elbow.

EXAM:
CHEST  2 VIEW

[chest lat]
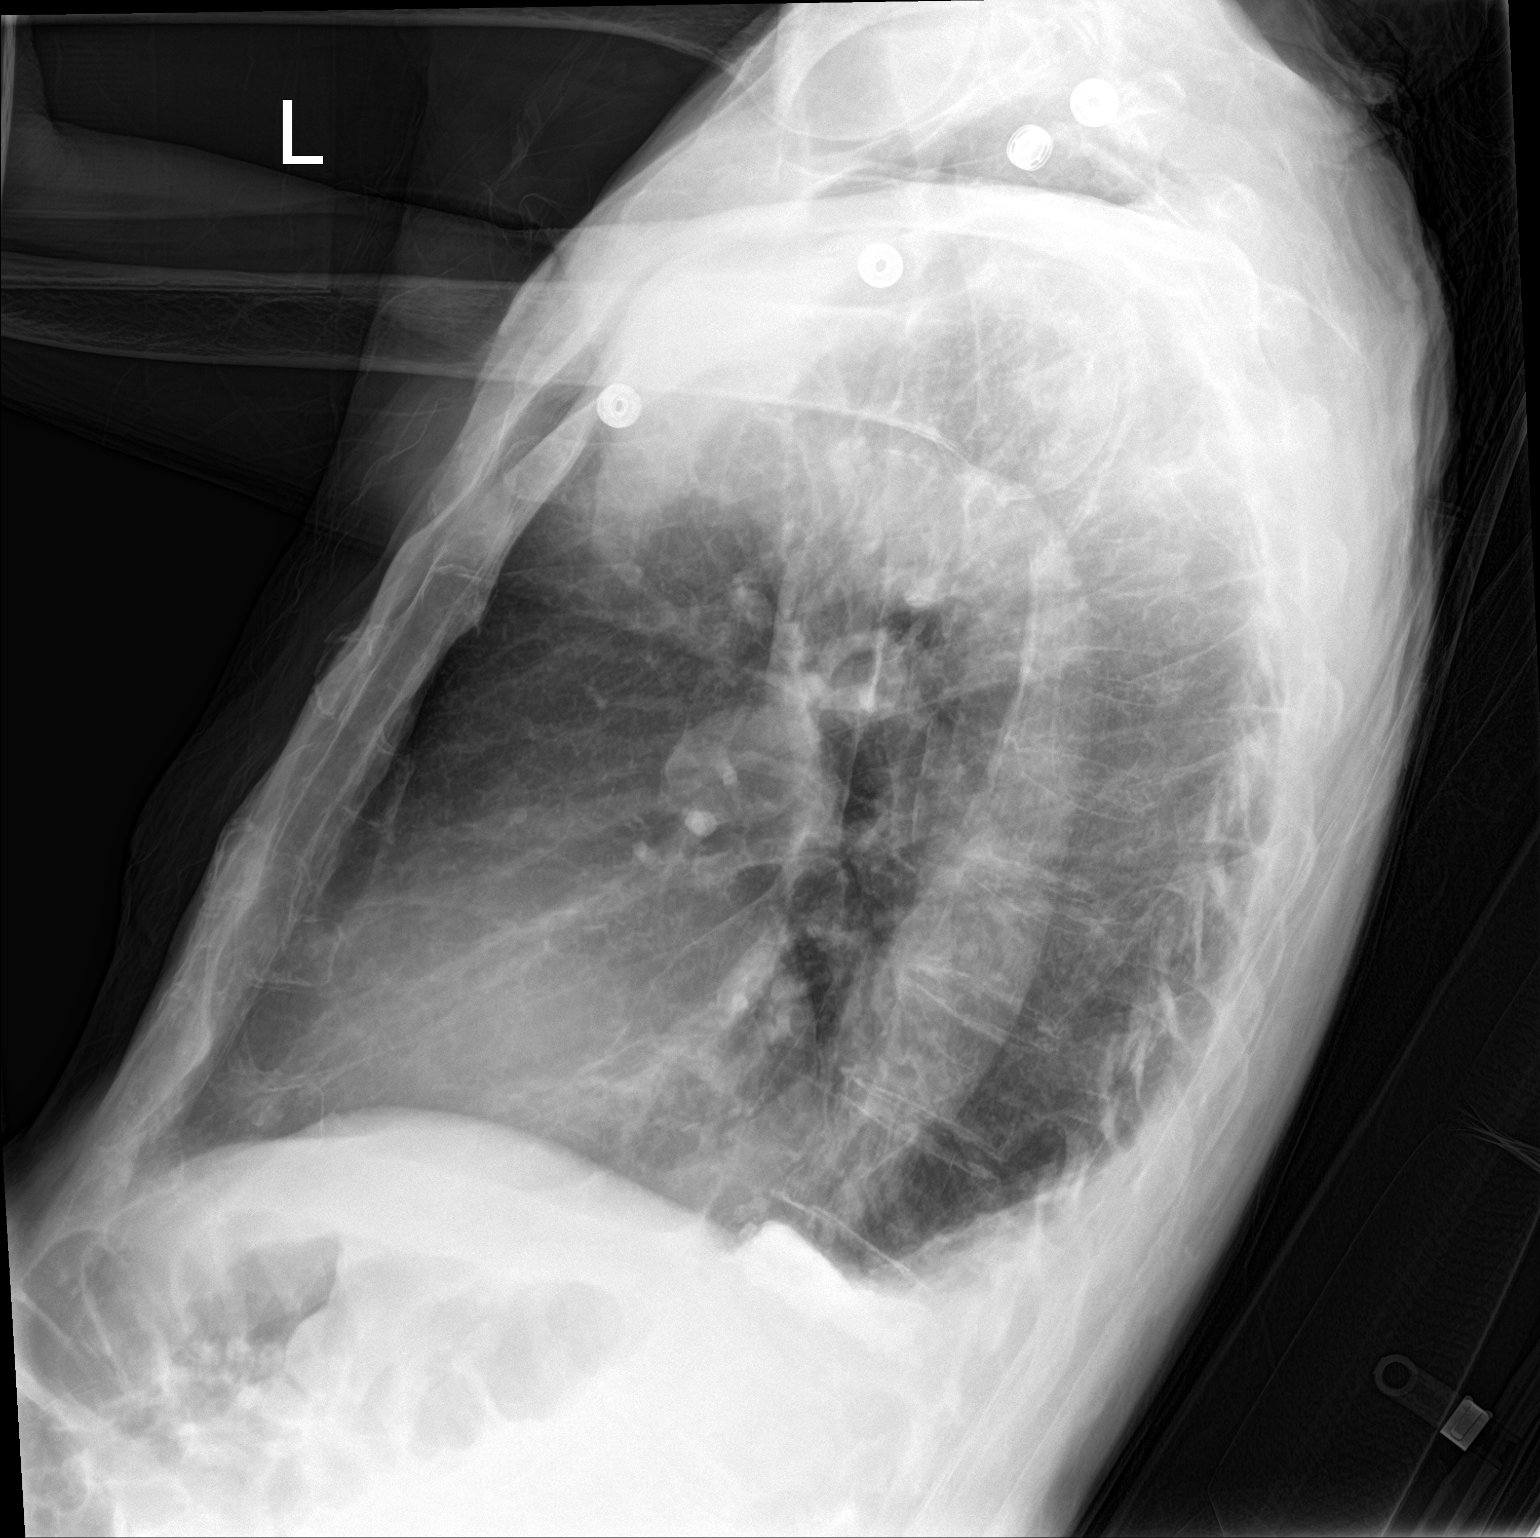

[chest ap]
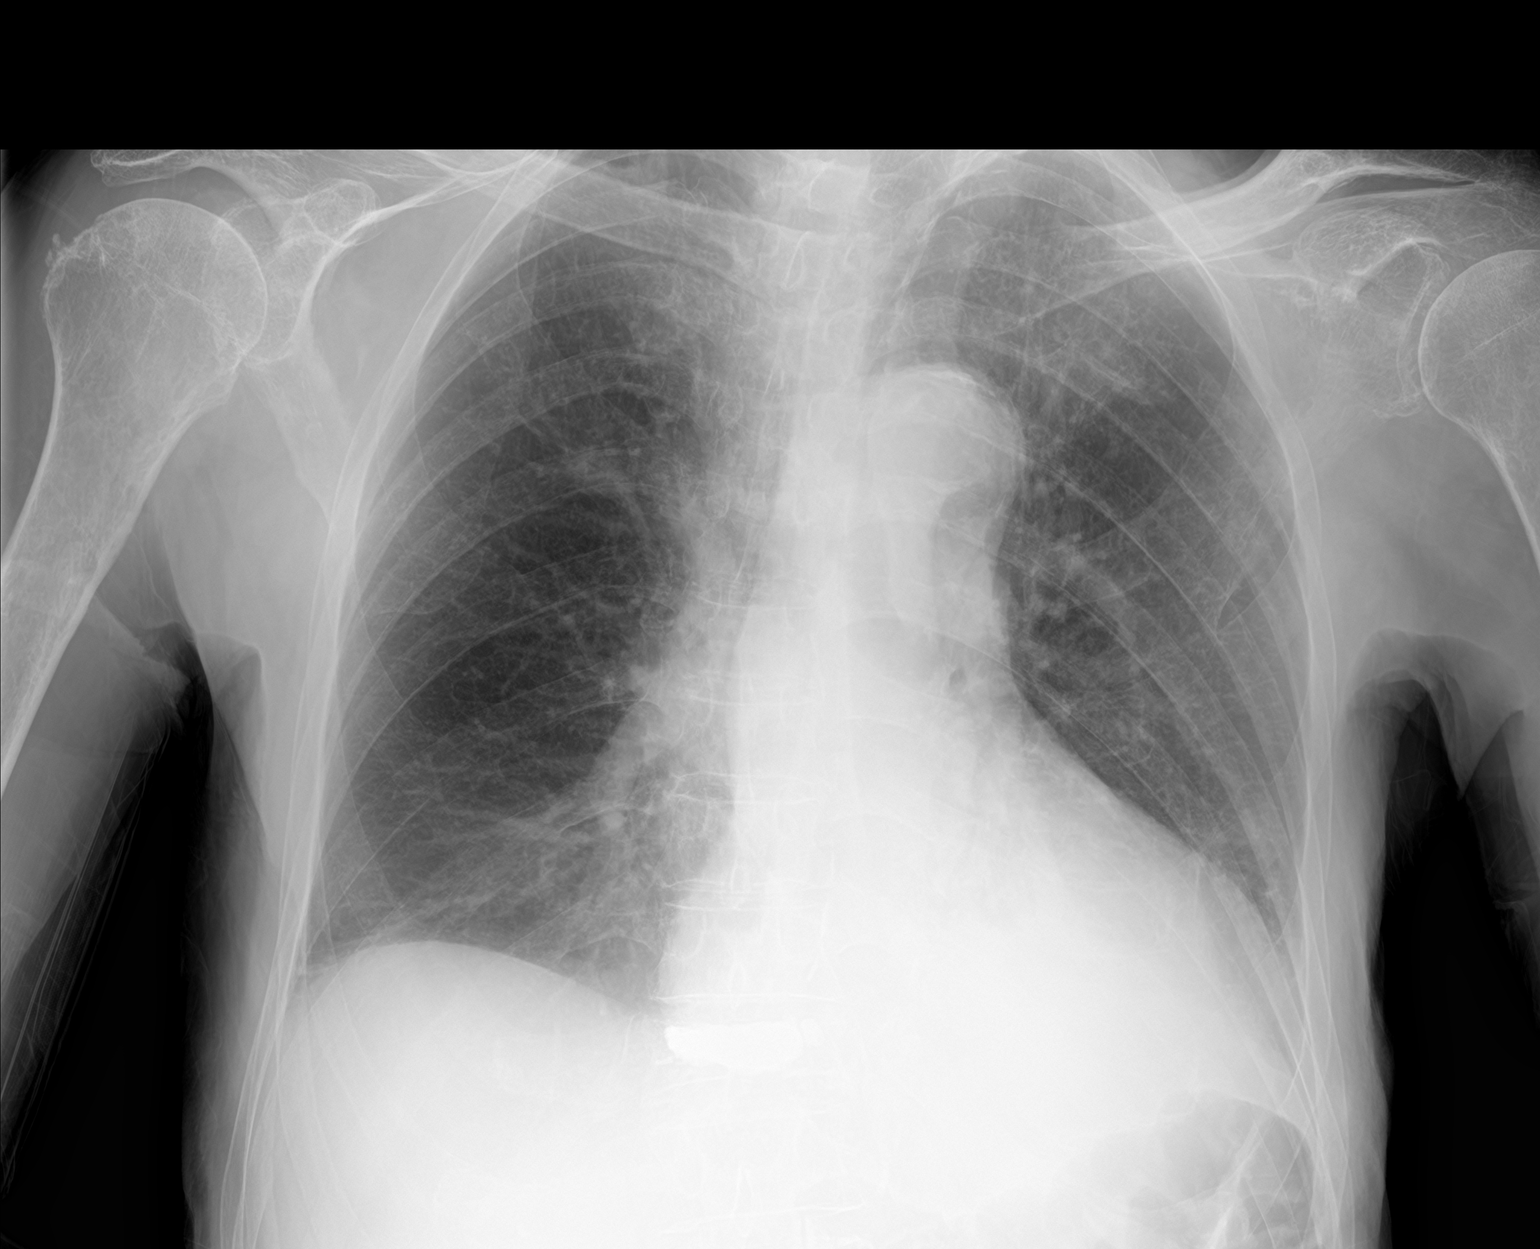

[2 of 2 positions shown; findings below may reference images not displayed]

FINDINGS: Increase cardiomegaly from prior. There is tortuosity and
atherosclerosis of the thoracic aorta. Small bilateral pleural
effusions, left greater than right. Mild vascular congestion without
overt pulmonary edema. No pneumothorax. Diffuse bony under
mineralization. Kyphoplasty within lower thoracic vertebra. No acute
osseous abnormality is seen.
IMPRESSION: Cardiomegaly with small pleural effusions and vascular congestion.
Recommend correlation for fluid overload.

Thoracic aortic atherosclerosis.

No definite acute traumatic process.

## 2017-04-23 IMAGING — DX DG ELBOW COMPLETE 3+V*R*
4 series · 4 of 4 positions shown · non-contrast
Comparison: None.

CLINICAL DATA: 89-year-old with right elbow pain after fall.
Hematoma.

EXAM:
RIGHT ELBOW - COMPLETE 3+ VIEW

[elbow ap]
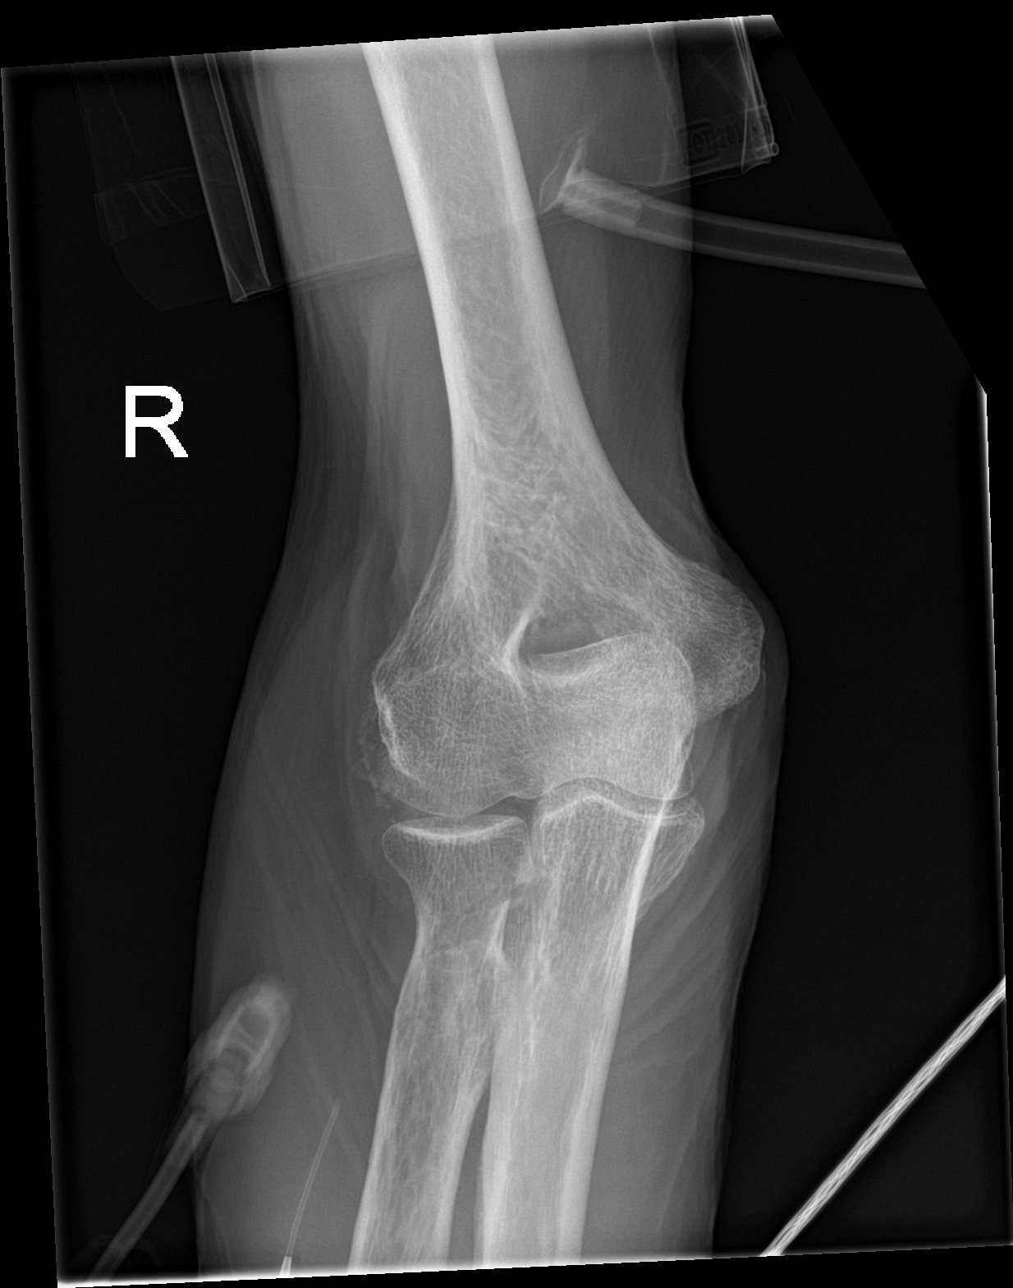

[elbow obl (1 of 2)]
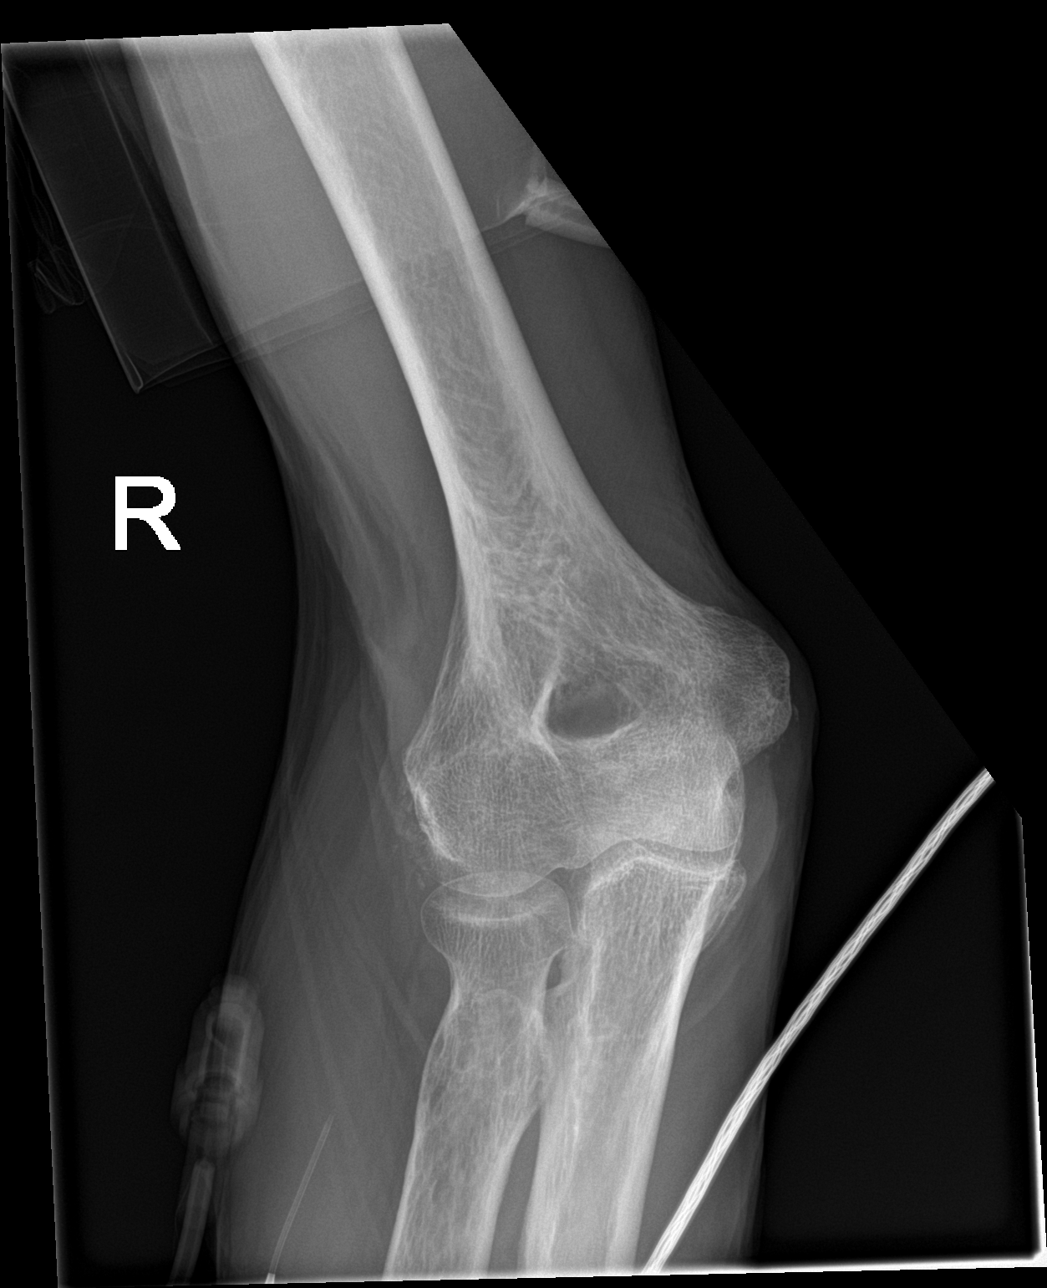

[elbow obl (2 of 2)]
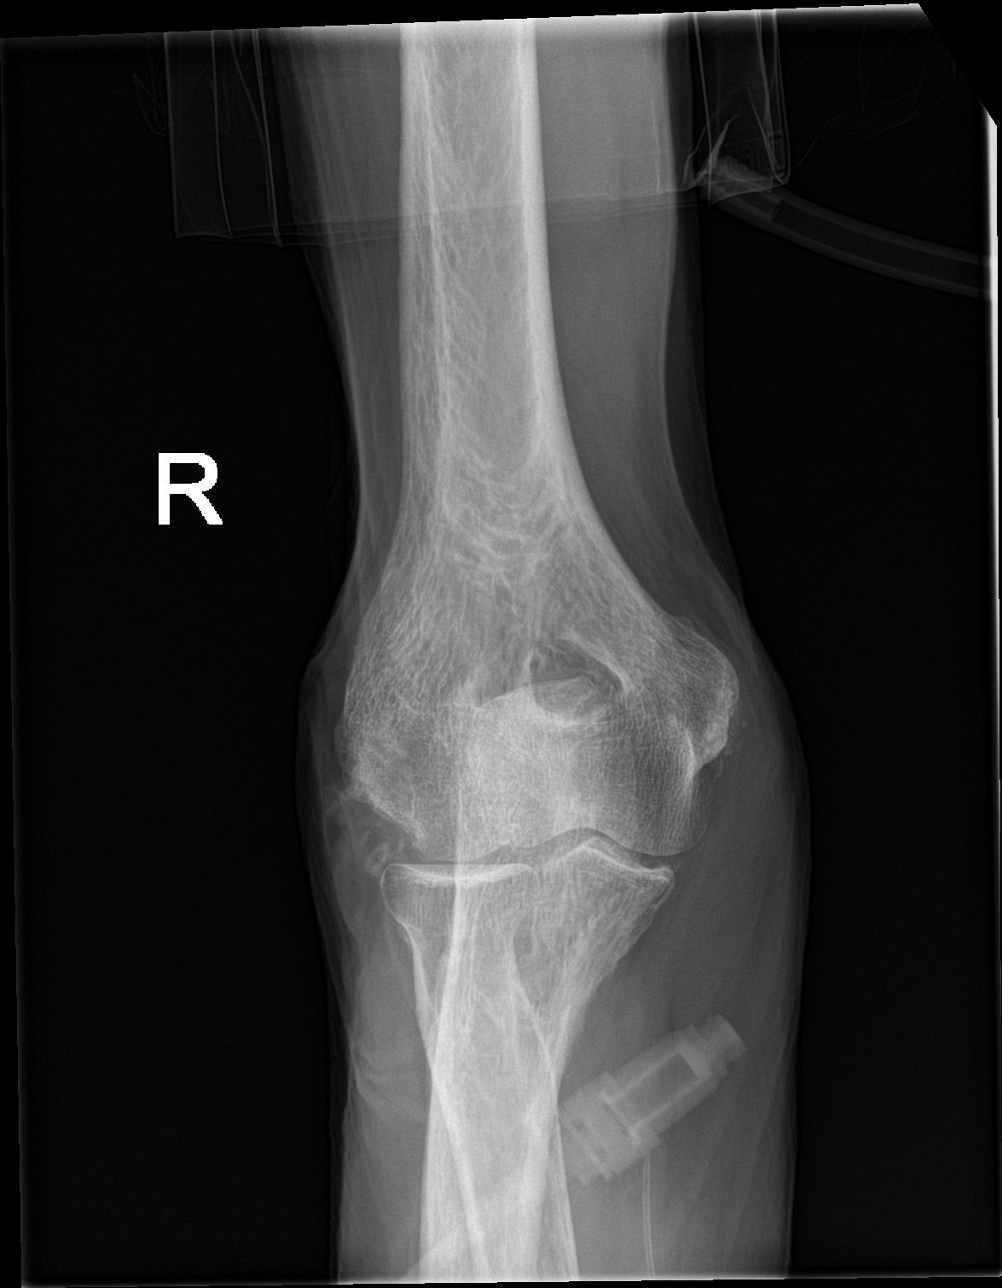

[elbow lat]
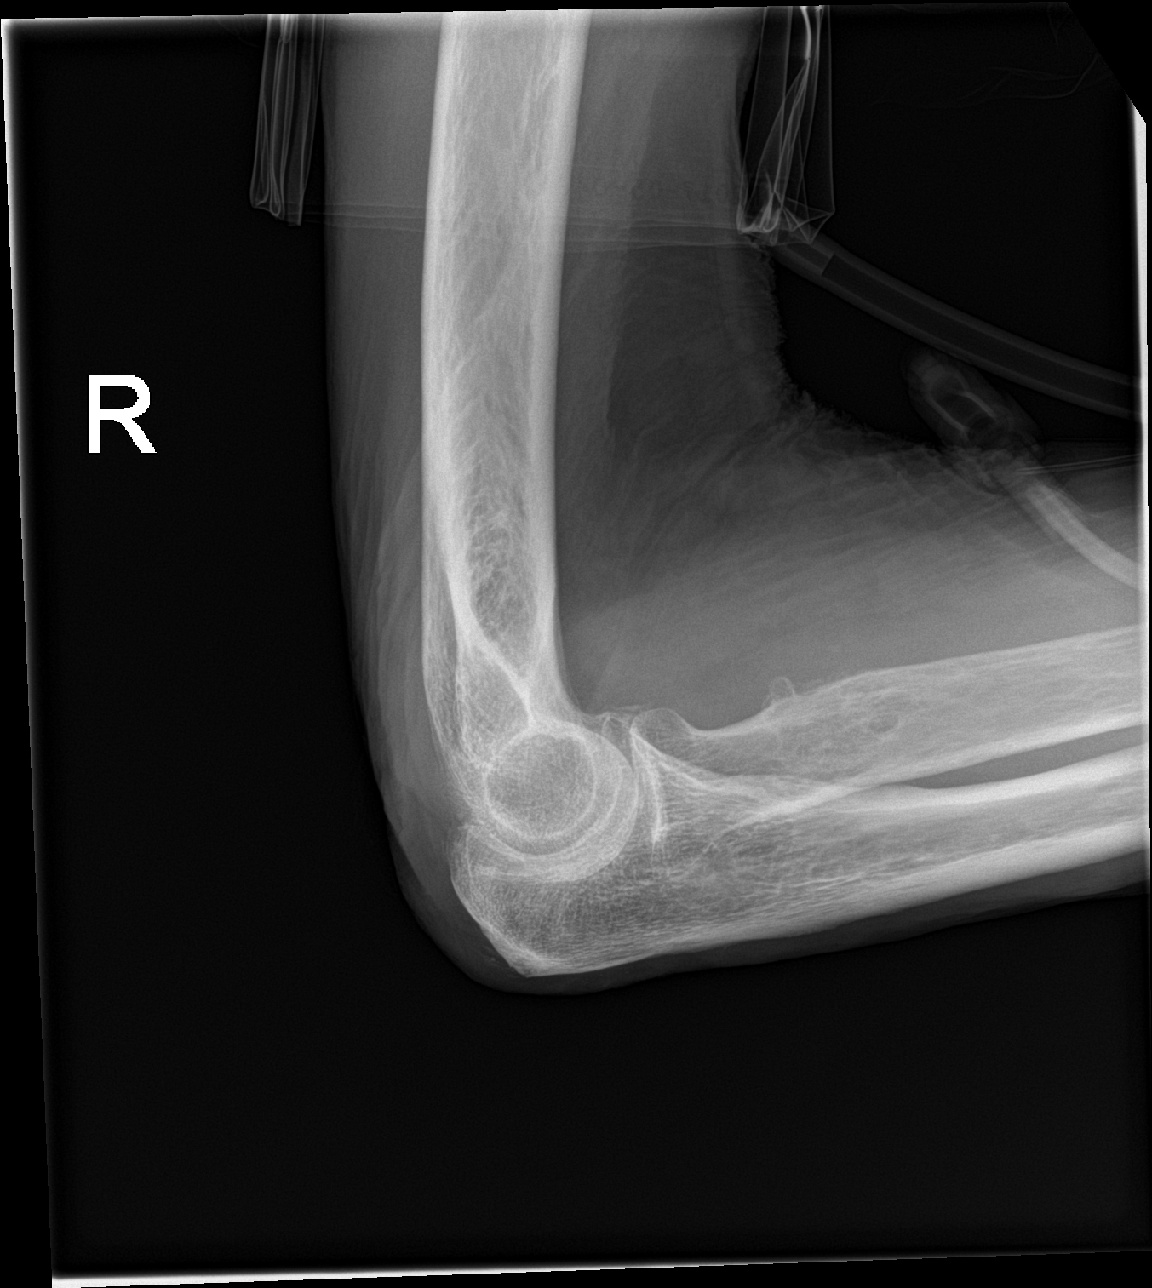

[4 of 4 positions shown; findings below may reference images not displayed]

FINDINGS: No acute fracture or dislocation. No elbow joint effusion. Mild
enthesopathic change at the flexor and extensor tendon insertions.
Soft tissue prominence posteriorly.
IMPRESSION: No acute fracture or subluxation of the right elbow.

## 2017-06-26 DIAGNOSIS — H524 Presbyopia: Secondary | ICD-10-CM | POA: Diagnosis not present

## 2017-06-26 DIAGNOSIS — H401132 Primary open-angle glaucoma, bilateral, moderate stage: Secondary | ICD-10-CM | POA: Diagnosis not present

## 2017-07-11 DIAGNOSIS — L570 Actinic keratosis: Secondary | ICD-10-CM | POA: Diagnosis not present

## 2017-07-11 DIAGNOSIS — C44319 Basal cell carcinoma of skin of other parts of face: Secondary | ICD-10-CM | POA: Diagnosis not present

## 2017-07-11 DIAGNOSIS — D225 Melanocytic nevi of trunk: Secondary | ICD-10-CM | POA: Diagnosis not present

## 2017-07-11 DIAGNOSIS — X32XXXD Exposure to sunlight, subsequent encounter: Secondary | ICD-10-CM | POA: Diagnosis not present

## 2017-07-11 DIAGNOSIS — L821 Other seborrheic keratosis: Secondary | ICD-10-CM | POA: Diagnosis not present

## 2017-08-02 DIAGNOSIS — E039 Hypothyroidism, unspecified: Secondary | ICD-10-CM | POA: Diagnosis not present

## 2017-08-02 DIAGNOSIS — R269 Unspecified abnormalities of gait and mobility: Secondary | ICD-10-CM | POA: Diagnosis not present

## 2017-08-22 DIAGNOSIS — X32XXXD Exposure to sunlight, subsequent encounter: Secondary | ICD-10-CM | POA: Diagnosis not present

## 2017-08-22 DIAGNOSIS — L570 Actinic keratosis: Secondary | ICD-10-CM | POA: Diagnosis not present

## 2017-08-25 DIAGNOSIS — M25551 Pain in right hip: Secondary | ICD-10-CM | POA: Diagnosis not present

## 2017-08-25 DIAGNOSIS — R54 Age-related physical debility: Secondary | ICD-10-CM | POA: Diagnosis not present

## 2017-08-25 DIAGNOSIS — R0781 Pleurodynia: Secondary | ICD-10-CM | POA: Diagnosis not present

## 2017-08-25 DIAGNOSIS — S60221A Contusion of right hand, initial encounter: Secondary | ICD-10-CM | POA: Diagnosis not present

## 2017-08-25 DIAGNOSIS — W19XXXA Unspecified fall, initial encounter: Secondary | ICD-10-CM | POA: Diagnosis not present

## 2017-08-25 DIAGNOSIS — M79644 Pain in right finger(s): Secondary | ICD-10-CM | POA: Diagnosis not present

## 2017-08-25 DIAGNOSIS — S301XXA Contusion of abdominal wall, initial encounter: Secondary | ICD-10-CM | POA: Diagnosis not present

## 2017-08-29 ENCOUNTER — Ambulatory Visit
Admission: RE | Admit: 2017-08-29 | Discharge: 2017-08-29 | Disposition: A | Discharge status: Home / Self Care | Payer: Medicare Other | Source: Ambulatory Visit | Attending: Nurse Practitioner | Admitting: Nurse Practitioner

## 2017-08-29 ENCOUNTER — Other Ambulatory Visit: Payer: Self-pay | Admitting: Nurse Practitioner

## 2017-08-29 DIAGNOSIS — S3992XA Unspecified injury of lower back, initial encounter: Secondary | ICD-10-CM | POA: Diagnosis not present

## 2017-08-29 DIAGNOSIS — M545 Low back pain: Secondary | ICD-10-CM

## 2017-08-29 DIAGNOSIS — S2241XA Multiple fractures of ribs, right side, initial encounter for closed fracture: Secondary | ICD-10-CM | POA: Diagnosis not present

## 2017-08-29 DIAGNOSIS — R0781 Pleurodynia: Secondary | ICD-10-CM

## 2017-09-09 DIAGNOSIS — R0781 Pleurodynia: Secondary | ICD-10-CM | POA: Diagnosis not present

## 2017-09-09 DIAGNOSIS — S2231XD Fracture of one rib, right side, subsequent encounter for fracture with routine healing: Secondary | ICD-10-CM | POA: Diagnosis not present

## 2017-09-14 ENCOUNTER — Other Ambulatory Visit: Payer: Self-pay | Admitting: Internal Medicine

## 2017-09-14 DIAGNOSIS — R1031 Right lower quadrant pain: Secondary | ICD-10-CM

## 2017-09-14 DIAGNOSIS — R269 Unspecified abnormalities of gait and mobility: Secondary | ICD-10-CM | POA: Diagnosis not present

## 2017-09-14 DIAGNOSIS — R4182 Altered mental status, unspecified: Secondary | ICD-10-CM | POA: Diagnosis not present

## 2017-09-14 DIAGNOSIS — S2241XD Multiple fractures of ribs, right side, subsequent encounter for fracture with routine healing: Secondary | ICD-10-CM | POA: Diagnosis not present

## 2017-09-20 ENCOUNTER — Ambulatory Visit
Admission: RE | Admit: 2017-09-20 | Discharge: 2017-09-20 | Disposition: A | Discharge status: Home / Self Care | Payer: Medicare Other | Source: Ambulatory Visit | Attending: Internal Medicine | Admitting: Internal Medicine

## 2017-09-20 DIAGNOSIS — R1031 Right lower quadrant pain: Secondary | ICD-10-CM

## 2017-09-20 DIAGNOSIS — N201 Calculus of ureter: Secondary | ICD-10-CM | POA: Diagnosis not present

## 2017-09-20 MED ORDER — IOPAMIDOL (ISOVUE-300) INJECTION 61%
100.0000 mL | Freq: Once | INTRAVENOUS | Status: AC | PRN
Start: 2017-09-20 — End: 2017-09-20
  Administered 2017-09-20: 100 mL via INTRAVENOUS

## 2017-09-25 DIAGNOSIS — R350 Frequency of micturition: Secondary | ICD-10-CM | POA: Diagnosis not present

## 2017-09-25 DIAGNOSIS — N202 Calculus of kidney with calculus of ureter: Secondary | ICD-10-CM | POA: Diagnosis not present

## 2017-09-27 DIAGNOSIS — M545 Low back pain: Secondary | ICD-10-CM | POA: Diagnosis not present

## 2017-09-28 ENCOUNTER — Ambulatory Visit: Payer: Medicare Other | Admitting: Physical Therapy

## 2017-10-02 ENCOUNTER — Ambulatory Visit: Payer: Medicare Other | Attending: Internal Medicine | Admitting: Physical Therapy

## 2017-10-02 DIAGNOSIS — R2681 Unsteadiness on feet: Secondary | ICD-10-CM | POA: Insufficient documentation

## 2017-10-02 DIAGNOSIS — R2689 Other abnormalities of gait and mobility: Secondary | ICD-10-CM | POA: Diagnosis not present

## 2017-10-02 DIAGNOSIS — M6281 Muscle weakness (generalized): Secondary | ICD-10-CM | POA: Diagnosis not present

## 2017-10-02 DIAGNOSIS — R293 Abnormal posture: Secondary | ICD-10-CM | POA: Insufficient documentation

## 2017-10-03 ENCOUNTER — Encounter: Payer: Self-pay | Admitting: Physical Therapy

## 2017-10-03 DIAGNOSIS — H43811 Vitreous degeneration, right eye: Secondary | ICD-10-CM | POA: Diagnosis not present

## 2017-10-03 DIAGNOSIS — H33012 Retinal detachment with single break, left eye: Secondary | ICD-10-CM | POA: Diagnosis not present

## 2017-10-03 NOTE — Therapy (Addendum)
[facility] [address] [place], [place], [address] Phone: [phone]   Fax:  [phone]  Physical Therapy Evaluation  Patient Details  Name: [name] MRN: [id number] Date of Birth: [birth year] Referring Provider: Lavone Orn, MD   Encounter Date: 10/02/2017  PT End of Session - 10/03/17 1940    Visit Number  1    Number of Visits  17    Date for PT Re-Evaluation  12/31/17    Authorization Type  Medicare/Tricare-No PTA    PT Start Time  1020    PT Stop Time  1100    PT Time Calculation (min)  40 min    Equipment Utilized During Treatment  Gait belt    Activity Tolerance  Patient tolerated treatment well    Behavior During Therapy  [facility] for tasks assessed/performed       Past Medical History:  Diagnosis Date  . Arthritis    back "issues"  . Enlarged prostate   . Foley catheter in place    remains in place to leg bag.  Marland Kitchen Hearing difficulty of both ears    hearing aids-sometimes  . Hypothyroidism    recently diagnosed  . Neuromuscular disorder ([place])    periodic sciatic nerve pain left side.  . Renal failure (ARF), acute on chronic [facility])    7'17 episode of acute renal failure with hospital visit after Kyphoplasty procedure, urinary retention found, and Foley catheter placed to relieve.  . Sight impaired    left eye "detached retina"- limited vision left eye.    Past Surgical History:  Procedure Laterality Date  . ABDOMINAL HERNIA REPAIR    . BACK SURGERY     herniated disc  . CARPAL TUNNEL RELEASE Bilateral   . CATARACT EXTRACTION    . EYE SURGERY Left    x2 left eye detached retina-continues to be treated" some vision loss"  . HERNIA REPAIR Right    right groin hernia repair  . INSERTION OF SUPRAPUBIC CATHETER N/A 11/05/2015   Procedure: INSERTION OF SUPRAPUBIC CATHETER;  Surgeon: Kathie Rhodes, MD;  Location: [facility];  Service: Urology;  Laterality: N/A;  . KNEE ARTHROSCOPY WITH EXCISION  BAKER'S CYST    . KYPHOPLASTY     08-03-15   . SEPTOPLASTY    . TESTICLE REMOVAL Left    ? why  . TRANSURETHRAL RESECTION OF PROSTATE N/A 11/05/2015   Procedure: TRANSURETHRAL RESECTION OF THE PROSTATE (TURP) WITH GYRUS;  Surgeon: Kathie Rhodes, MD;  Location: [facility];  Service: Urology;  Laterality: N/A;  . VASECTOMY      There were no vitals filed for this visit.   Subjective Assessment - 10/03/17 1937    Subjective  Wife reports he is losing strength in his legs, getting worse getting up out of seats.  Gradually, this has been going on for about a year.  Had a fall in July with 3 cracked ribs on R side.  The fall occurred tripping on a vine while working in the yard-had to have help getting up from the ground.    Patient is accompained by:  Family member   wife, Hinton Dyer   Patient Stated Goals  To try to get rid of pain; agrees to wife's goal of working on strength and balance.    Currently in Pain?  Yes    Pain Score  3     Pain Location  Rib cage    Pain Orientation  Right    Pain Descriptors /  Indicators  Aching    Pain Type  Acute pain    Pain Onset  1 to 4 weeks ago    Pain Frequency  Intermittent    Aggravating Factors   bending, lifting, lying down    Pain Relieving Factors  medications, pain cream    Effect of Pain on Daily Activities  PT will monitor pain, but will not address pain as a goal at this time (due to nature of pain)         [facility] PT Assessment - 10/03/17 1938      Assessment   Medical Diagnosis  R rib pain; pain and mobility    Referring Provider  Lavone Orn, MD    Onset Date/Surgical Date  09/27/17   referral date     Precautions   Precautions  Fall    Precaution Comments  Has fractured ribs on R side from fall; no precautions regarding ribs per patient/wife report      Balance Screen   Has the patient fallen in the past 6 months  Yes    How many times?  1    Has the patient had a decrease in activity level because of a fear of falling?   No    Is  the patient reluctant to leave their home because of a fear of falling?   No      Home Social worker  Private residence    Living Arrangements  Spouse/significant other    Available Help at Discharge  Family    Type of [place] to enter    Entrance Stairs-Number of Steps  1    Entrance Stairs-Rails  None    Home Layout  One level;Laundry or work area in [employer] - single point;Walker - 2 wheels   with rubber tip quad cane     Prior Function   Level of Independence  Independent with basic ADLs;Independent with household mobility with device    Leisure  Enjoys yard work      Observation/Other Assessments   Focus on Therapeutic Outcomes (FOTO)   NA      Posture/Postural Control   Posture/Postural Control  Postural limitations    Postural Limitations  Rounded Shoulders;Forward head;Increased thoracic kyphosis;Flexed trunk      Strength   Overall Strength Comments  Grossly tested:  bilateral hip flexion 4/5, bilateral quads and hamstrings 3+/5, bilateral dorsiflexion 3/5      Transfers   Transfers  Sit to Stand;Stand to Sit    Sit to Stand  5: Supervision;With upper extremity assist;From chair/3-in-1   slowed due to rib pain   Stand to Sit  5: Supervision;With upper extremity assist;To chair/3-in-1    Comments  During eval, wife reports:  "this is the best I've seen him get up from chair in a while."      Ambulation/Gait   Ambulation/Gait  Yes    Ambulation/Gait Assistance  4: Min guard    Ambulation Distance (Feet)  200 Feet    Assistive device  Straight cane    Gait Pattern  Step-through pattern;Decreased step length - right;Decreased step length - left;Trunk flexed;Poor foot clearance - left;Poor foot clearance - right    Ambulation Surface  Level;Indoor    Gait velocity  26.81 sec = 1.22 ft/sec      Standardized Balance Assessment   Standardized Balance Assessment  Timed Up and Go Test;Berg Balance  Test       Berg Balance Test   Sit to Stand  Able to stand  independently using hands    Standing Unsupported  Able to stand 2 minutes with supervision    Sitting with Back Unsupported but Feet Supported on Floor or Stool  Able to sit safely and securely 2 minutes    Stand to Sit  Controls descent by using hands    Transfers  Able to transfer safely, definite need of hands    Standing Unsupported with Eyes Closed  Able to stand 10 seconds with supervision    Standing Ubsupported with Feet Together  Needs help to attain position but able to stand for 30 seconds with feet together    From Standing, Reach Forward with Outstretched Arm  Can reach forward >12 cm safely (5")    From Standing Position, Pick up Object from Floor  Unable to try/needs assist to keep balance    From Standing Position, Turn to Look Behind Over each Shoulder  Turn sideways only but maintains balance    Turn 360 Degrees  Needs assistance while turning    Standing Unsupported, Alternately Place Feet on Step/Stool  Needs assistance to keep from falling or unable to try    Standing Unsupported, One Foot in Front  Able to take small step independently and hold 30 seconds    Standing on One Leg  Unable to try or needs assist to prevent fall    Total Score  27    Berg comment:  Scores <45/56 indicate increased fall risk.        Timed Up and Go Test   Normal TUG (seconds)  34.22    TUG Comments  Scores >13.5 sec indicate increased fall risk, >30 seconds indicate difficulty with ADLs in home.                Objective measurements completed on examination: See above findings.              PT Education - 10/03/17 1939    Education Details  POC, results of objective measures, including high fall risk; discussed proper use of cane with practice at end of eval session for sequence and step length    Person(s) Educated  Patient;Spouse    Methods  Explanation;Demonstration;Verbal cues    Comprehension  Verbalized  understanding;Returned demonstration;Verbal cues required       PT Short Term Goals - 10/03/17 1719      PT SHORT TERM GOAL #1   Title  Pt will perform HEP with wife's supervision for improved balance, strength and gait.  TARGET 11/02/17    Time  5    Period  Weeks    Status  New    Target Date  11/02/17      PT SHORT TERM GOAL #2   Title  Pt will improve Berg Balance score to at least 32/56 for decreased fall risk.    Time  5    Period  Weeks    Status  New    Target Date  11/02/17      PT SHORT TERM GOAL #3   Title  Pt will improve TUG score to equal to or less than 28 seconds for decreased fall risk.    Time  5    Period  Weeks    Status  New    Target Date  11/02/17      PT SHORT TERM GOAL #4   Title  Pt will  ambulate at least 300 ft using least restrictive assistive device (cane versus RW) modified independnetly for improved safety with gait.    Time  5    Period  Weeks    Status  New    Target Date  11/02/17      PT SHORT TERM GOAL #5   Title  Pt/wife will verbalize understanding of fall prevention in home environment.    Time  5    Period  Weeks    Status  New    Target Date  11/02/17        PT Long Term Goals - 10/03/17 1944      PT LONG TERM GOAL #1   Title  Pt will improve gait velocity score to at least 1.8 ft/sec for decreased fall risk.  TARGET 11/30/17    Time  9    Period  Weeks    Status  New    Target Date  11/30/17      PT LONG TERM GOAL #2   Title  Pt will improve Berg score to at least 37/56 for decreased fall risk.    Time  9    Period  Weeks    Status  New    Target Date  11/30/17      PT LONG TERM GOAL #3   Title  Pt will improve TUG score to less than or equal to 20 seconds for decreased fall risk.    Time  9    Period  Weeks    Status  New    Target Date  11/30/17      PT LONG TERM GOAL #4   Title  Pt will verbalize plans for ongoing community fitness post d/c.    Time  9    Period  Weeks    Status  New    Target Date   11/30/17             Plan - 10/03/17 1941    Clinical Impression Statement  Pt is an 84 year old male with history of one fall in the past 6 months, where he sustained rib fractures on R side.  He presents with decreased muscle strength, decreased balance, decreased independence with gait.  He is at fall risk per [facility] score, TUG, and gait velocity scores.  He enjoys working in his yard and was independent with mobility using cane prior to recent fall.  He would benefit from skilled PT to address the above stated deficits and decrease fall risk.    History and Personal Factors relevant to plan of care:  Fall risk per Berg, TUG, gait velocity scores; 1 fall in past 6 months.    Clinical Presentation  Evolving    Clinical Presentation due to:  slowed mobility, decreased muscle strength and balance    Clinical Decision Making  Moderate    Rehab Potential  Good    Clinical Impairments Affecting Rehab Potential  Good family support    PT Frequency  2x / week    PT Duration  8 weeks   plus eval, = 9 weeks total POC   PT Treatment/Interventions  ADLs/Self Care Home Management;DME Instruction;Balance training;Therapeutic exercise;Therapeutic activities;Functional mobility training;Gait training;Neuromuscular re-education;Patient/family education    PT Next Visit Plan  Initiate HEP (try OTAGO) and walking program (educate in gait safety with trial of RW versus cane)     Consulted and Agree with Plan of Care  Patient;Family member/caregiver    Family Member Consulted  wife, Hinton Dyer       Patient will benefit from skilled therapeutic intervention in order to improve the following deficits and impairments:  Abnormal gait, Decreased balance, Difficulty walking, Decreased strength, Decreased mobility  Visit Diagnosis: Other abnormalities of gait and mobility - Plan: PT plan of care cert/re-cert  Unsteadiness on feet - Plan: PT plan of care cert/re-cert  Muscle weakness (generalized) - Plan:  PT plan of care cert/re-cert  Abnormal posture - Plan: PT plan of care cert/re-cert     Problem List Patient Active Problem List   Diagnosis Date Noted  . BPH (benign prostatic hypertrophy) with urinary retention 11/05/2015  . AKI (acute kidney injury) ([place]) 08/15/2015  . Acute renal failure ([place]) 08/15/2015  . Constipation 08/15/2015  . Urinary retention 08/15/2015  . Hyperkalemia 08/15/2015    Jilene Spohr W. 10/03/2017, 7:52 PM  Frazier Butt., PT   [facility] [address] [place], [place], [address] Phone: [phone]   Fax:  [phone]  Name: [name] MRN: [id number] Date of Birth: 24-May-1925

## 2017-10-05 ENCOUNTER — Ambulatory Visit: Payer: Medicare Other | Admitting: Physical Therapy

## 2017-10-08 ENCOUNTER — Ambulatory Visit: Payer: Medicare Other | Admitting: Physical Therapy

## 2017-10-08 DIAGNOSIS — M6281 Muscle weakness (generalized): Secondary | ICD-10-CM

## 2017-10-08 DIAGNOSIS — R2681 Unsteadiness on feet: Secondary | ICD-10-CM | POA: Diagnosis not present

## 2017-10-08 DIAGNOSIS — R2689 Other abnormalities of gait and mobility: Secondary | ICD-10-CM

## 2017-10-08 DIAGNOSIS — R293 Abnormal posture: Secondary | ICD-10-CM | POA: Diagnosis not present

## 2017-10-09 ENCOUNTER — Encounter: Payer: Self-pay | Admitting: Physical Therapy

## 2017-10-09 NOTE — Patient Instructions (Signed)
Pt given OTAGA booklet

## 2017-10-09 NOTE — Therapy (Signed)
[facility] [address] [place], [place], [address] Phone: [phone]   Fax:  [phone]  Physical Therapy Treatment  Patient Details  Name: [name] MRN: [id number] Date of Birth: 05-09-1925 Referring Provider: Lavone Orn, MD   Encounter Date: 10/08/2017  PT End of Session - 10/09/17 2010    Visit Number  2    Number of Visits  17    Date for PT Re-Evaluation  12/31/17    Authorization Type  Medicare/Tricare-No PTA    PT Start Time  1315    PT Stop Time  1402    PT Time Calculation (min)  47 min    Equipment Utilized During Treatment  Gait belt       Past Medical History:  Diagnosis Date  . Arthritis    back "issues"  . Enlarged prostate   . Foley catheter in place    remains in place to leg bag.  Marland Kitchen Hearing difficulty of both ears    hearing aids-sometimes  . Hypothyroidism    recently diagnosed  . Neuromuscular disorder ([place])    periodic sciatic nerve pain left side.  . Renal failure (ARF), acute on chronic [facility])    7'17 episode of acute renal failure with hospital visit after Kyphoplasty procedure, urinary retention found, and Foley catheter placed to relieve.  . Sight impaired    left eye "detached retina"- limited vision left eye.    Past Surgical History:  Procedure Laterality Date  . ABDOMINAL HERNIA REPAIR    . BACK SURGERY     herniated disc  . CARPAL TUNNEL RELEASE Bilateral   . CATARACT EXTRACTION    . EYE SURGERY Left    x2 left eye detached retina-continues to be treated" some vision loss"  . HERNIA REPAIR Right    right groin hernia repair  . INSERTION OF SUPRAPUBIC CATHETER N/A 11/05/2015   Procedure: INSERTION OF SUPRAPUBIC CATHETER;  Surgeon: Kathie Rhodes, MD;  Location: [facility];  Service: Urology;  Laterality: N/A;  . KNEE ARTHROSCOPY WITH EXCISION BAKER'S CYST    . KYPHOPLASTY     08-03-15   . SEPTOPLASTY    . TESTICLE REMOVAL Left    ? why  . TRANSURETHRAL  RESECTION OF PROSTATE N/A 11/05/2015   Procedure: TRANSURETHRAL RESECTION OF THE PROSTATE (TURP) WITH GYRUS;  Surgeon: Kathie Rhodes, MD;  Location: [facility];  Service: Urology;  Laterality: N/A;  . VASECTOMY      There were no vitals filed for this visit.  Subjective Assessment - 10/09/17 1959    Subjective  Pt using SPC - wife asks if it is adjusted correctly - states someone said it was too low and raised it     Patient is accompained by:  Family member   wife   Patient Stated Goals  To try to get rid of pain; agrees to wife's goal of working on strength and balance.    Currently in Pain?  Yes    Pain Score  3     Pain Location  Rib cage    Pain Orientation  Right    Pain Descriptors / Indicators  Aching    Pain Type  Acute pain    Pain Onset  1 to 4 weeks ago    Pain Frequency  Intermittent                       [facility] Adult PT Treatment/Exercise - 10/09/17 0001  Ambulation/Gait   Ambulation/Gait  Yes    Ambulation/Gait Assistance  4: Min guard    Ambulation Distance (Feet)  115 Feet   2 reps - 1 with SPC and 1 with RW   Assistive device  Straight cane;Rolling walker    Gait Pattern  Step-through pattern;Poor foot clearance - left;Poor foot clearance - right;Trunk flexed    Ambulation Surface  Level;Indoor      Exercises   Exercises  Knee/Hip      Knee/Hip Exercises: Aerobic   Recumbent Bike  SciFit level 1.5 x 5" with UE's and LE's          Balance Exercises - 10/09/17 2001      OTAGO PROGRAM   Head Movements  Sitting;5 reps    Neck Movements  Sitting;5 reps    Back Extension  Standing;Other reps (comment)   2 reps   Trunk Movements  Standing;Other reps (comment)   3 reps with UE support on back of chair   Ankle Movements  Sitting;10 reps    Sideways Walking  Assistive device    One Leg Stand  10 seconds, support    Sit to Stand  5 reps, bilateral support    Overall OTAGO Comments  Pt needs UE support for balance       Pt performed  forward, back and side kicks 10 reps each leg with UE support on counter Marching in place 10 reps each leg with UE support on counter with CGA  PT Education - 10/09/17 2010    Education Details  Began HEP- OTAGA program     Person(s) Educated  Patient;Spouse    Methods  Explanation;Demonstration;Handout    Comprehension  Verbalized understanding;Returned demonstration       PT Short Term Goals - 10/03/17 1719      PT SHORT TERM GOAL #1   Title  Pt will perform HEP with wife's supervision for improved balance, strength and gait.  TARGET 11/02/17    Time  5    Period  Weeks    Status  New    Target Date  11/02/17      PT SHORT TERM GOAL #2   Title  Pt will improve Berg Balance score to at least 32/56 for decreased fall risk.    Time  5    Period  Weeks    Status  New    Target Date  11/02/17      PT SHORT TERM GOAL #3   Title  Pt will improve TUG score to equal to or less than 28 seconds for decreased fall risk.    Time  5    Period  Weeks    Status  New    Target Date  11/02/17      PT SHORT TERM GOAL #4   Title  Pt will ambulate at least 300 ft using least restrictive assistive device (cane versus RW) modified independnetly for improved safety with gait.    Time  5    Period  Weeks    Status  New    Target Date  11/02/17      PT SHORT TERM GOAL #5   Title  Pt/wife will verbalize understanding of fall prevention in home environment.    Time  5    Period  Weeks    Status  New    Target Date  11/02/17        PT Long Term Goals - 10/03/17 1944      PT  LONG TERM GOAL #1   Title  Pt will improve gait velocity score to at least 1.8 ft/sec for decreased fall risk.  TARGET 11/30/17    Time  9    Period  Weeks    Status  New    Target Date  11/30/17      PT LONG TERM GOAL #2   Title  Pt will improve Berg score to at least 37/56 for decreased fall risk.    Time  9    Period  Weeks    Status  New    Target Date  11/30/17      PT LONG TERM GOAL #3   Title  Pt  will improve TUG score to less than or equal to 20 seconds for decreased fall risk.    Time  9    Period  Weeks    Status  New    Target Date  11/30/17      PT LONG TERM GOAL #4   Title  Pt will verbalize plans for ongoing community fitness post d/c.    Time  9    Period  Weeks    Status  New    Target Date  11/30/17            Plan - 10/09/17 2010    Clinical Impression Statement  Pt is much safer with use of RW than with SPC (with rubber quad tip) for assistance with ambulation; pt states wife does not like for him to use RW because he pushes it too far ahead and then leans over; pt able to stay inside RW with verbal cues for positioning for initial 3/4 distance around track and then began to push RW too far ahead, resulting in greater flexed posture     Rehab Potential  Good    Clinical Impairments Affecting Rehab Potential  Good family support    PT Frequency  2x / week    PT Duration  8 weeks    PT Treatment/Interventions  ADLs/Self Care Home Management;DME Instruction;Balance training;Therapeutic exercise;Therapeutic activities;Functional mobility training;Gait training;Neuromuscular re-education;Patient/family education    PT Next Visit Plan  continue with OTAGA exercise program for HEP (I highlighted exercises in his booklet which he was given for HEP); please give kicks (3 directions) and marching for HEP as these exs not in booklet; cont gait    Consulted and Agree with Plan of Care  Patient;Family member/caregiver    Family Member Consulted  wife, Hinton Dyer       Patient will benefit from skilled therapeutic intervention in order to improve the following deficits and impairments:  Abnormal gait, Decreased balance, Difficulty walking, Decreased strength, Decreased mobility  Visit Diagnosis: Other abnormalities of gait and mobility  Unsteadiness on feet  Muscle weakness (generalized)     Problem List Patient Active Problem List   Diagnosis Date Noted  . BPH (benign  prostatic hypertrophy) with urinary retention 11/05/2015  . AKI (acute kidney injury) ([place]) 08/15/2015  . Acute renal failure ([place]) 08/15/2015  . Constipation 08/15/2015  . Urinary retention 08/15/2015  . Hyperkalemia 08/15/2015    [place], PT 10/09/2017, 8:18 PM  [facility] [address] [place], [place], [address] Phone: [phone]   Fax:  [phone]  Name: [name] MRN: [id number] Date of Birth: 04/29/1925

## 2017-10-11 ENCOUNTER — Encounter: Payer: Self-pay | Admitting: Physical Therapy

## 2017-10-11 ENCOUNTER — Ambulatory Visit: Payer: Medicare Other | Admitting: Physical Therapy

## 2017-10-11 DIAGNOSIS — M6281 Muscle weakness (generalized): Secondary | ICD-10-CM

## 2017-10-11 DIAGNOSIS — R2689 Other abnormalities of gait and mobility: Secondary | ICD-10-CM

## 2017-10-11 DIAGNOSIS — R2681 Unsteadiness on feet: Secondary | ICD-10-CM | POA: Diagnosis not present

## 2017-10-11 DIAGNOSIS — R293 Abnormal posture: Secondary | ICD-10-CM | POA: Diagnosis not present

## 2017-10-11 NOTE — Patient Instructions (Addendum)
"  I love a Database administrator    Using a chair if necessary, march in place 4 times in each phase: (1) Foot raised 6" (2) 12" (3) 18" (4) as high as you can. Repeat __10__ times. Do __1__ sessions per day.  http://gt2.exer.BE/010    http://gt2.exer.us/344   Back Leg Kick    Swing leg back as far as possible. Return to center. Repeat with other leg. Repeat __10__ times. Do _1__ sessions per day.  http://gt2.exer.us/483   Copyright  VHI. All rights reserved.  Side Leg Raise (Standing)    Stand with support and extend leg out to side. Keep leg straight and lead with heel. Keep torso straight. Hold 1 count. Slowly return to starting position. Repeat _10___ times, each leg.  ALSO - GAVE FORWARD LEG KICK from [place]

## 2017-10-12 NOTE — Therapy (Signed)
[facility] [address] [place], [place], [address] Phone: [phone]   Fax:  [phone]  Physical Therapy Treatment  Patient Details  Name: [name] MRN: [id number] Date of Birth: 1925/07/16 Referring Provider: Lavone Orn, MD   Encounter Date: 10/11/2017  PT End of Session - 10/12/17 1119    Visit Number  3    Number of Visits  17    Date for PT Re-Evaluation  12/31/17    Authorization Type  Medicare/Tricare-No PTA    PT Start Time  0847    PT Stop Time  0933    PT Time Calculation (min)  46 min       Past Medical History:  Diagnosis Date  . Arthritis    back "issues"  . Enlarged prostate   . Foley catheter in place    remains in place to leg bag.  Marland Kitchen Hearing difficulty of both ears    hearing aids-sometimes  . Hypothyroidism    recently diagnosed  . Neuromuscular disorder ([place])    periodic sciatic nerve pain left side.  . Renal failure (ARF), acute on chronic [facility])    7'17 episode of acute renal failure with hospital visit after Kyphoplasty procedure, urinary retention found, and Foley catheter placed to relieve.  . Sight impaired    left eye "detached retina"- limited vision left eye.    Past Surgical History:  Procedure Laterality Date  . ABDOMINAL HERNIA REPAIR    . BACK SURGERY     herniated disc  . CARPAL TUNNEL RELEASE Bilateral   . CATARACT EXTRACTION    . EYE SURGERY Left    x2 left eye detached retina-continues to be treated" some vision loss"  . HERNIA REPAIR Right    right groin hernia repair  . INSERTION OF SUPRAPUBIC CATHETER N/A 11/05/2015   Procedure: INSERTION OF SUPRAPUBIC CATHETER;  Surgeon: Kathie Rhodes, MD;  Location: [facility];  Service: Urology;  Laterality: N/A;  . KNEE ARTHROSCOPY WITH EXCISION BAKER'S CYST    . KYPHOPLASTY     08-03-15   . SEPTOPLASTY    . TESTICLE REMOVAL Left    ? why  . TRANSURETHRAL RESECTION OF PROSTATE N/A 11/05/2015   Procedure:  TRANSURETHRAL RESECTION OF THE PROSTATE (TURP) WITH GYRUS;  Surgeon: Kathie Rhodes, MD;  Location: [facility];  Service: Urology;  Laterality: N/A;  . VASECTOMY      There were no vitals filed for this visit.  Subjective Assessment - 10/12/17 1108    Subjective  Pt reports he has done his exercises as instructed; wife reports pt needs to review the back extension and the turning exercise (trunk rotation)    Patient is accompained by:  Family member    Patient Stated Goals  To try to get rid of pain; agrees to wife's goal of working on strength and balance.                       [facility] Adult PT Treatment/Exercise - 10/12/17 0001      Transfers   Transfers  Sit to Stand;Stand to Sit    Sit to Stand  5: Supervision    Sit to Stand Details  Verbal cues for technique    Stand to Sit  5: Supervision    Comments  bil. support needed; pt unable to perform without UE support      Ambulation/Gait   Ambulation/Gait  Yes    Ambulation/Gait Assistance  5: Supervision  Ambulation Distance (Feet)  230 Feet   cues to stand erect and increase step length   Assistive device  Straight cane    Gait Pattern  Step-through pattern;Trunk flexed    Ambulation Surface  Level;Indoor    Stairs  Yes    Stairs Assistance  5: Supervision    Stair Management Technique  Forwards;Two rails;Step to pattern    Number of Stairs  4    Height of Stairs  6    Gait Comments  pt prefers to use SPC rather than RW; wife states she does not really like for pt to use RW due to pt pushing RW too far ahead and leaning over more than he currently does      Neuro Re-ed    Neuro Re-ed Details   Pt performed standing forward, back and side kicks 10 reps each at counter with bil. UE support on counter; marching in place 10 reps each with LUE support  - these exercises added to HEP       Knee/Hip Exercises: Aerobic   Recumbent Bike  SciFit level 1.5 x 5" with UE's and LE's      NeuroRe-ed;  Pt performed standing  backward, forward and side kicks - 10 reps each at counter with UE support for safety Marching in place 10 reps with each leg - with LUE support on counter with CGA;  Cues given for pt to stand as erect as possible - Added these exs. To his HEP)  -  Otaga exs. previously given        PT Education - 10/12/17 1117    Education Details  revised trunk rotation and back extension exs. in [place] program - pt to stand  with counter behind him for both of these exercises; chair in front for assistance with trunk rotation exercise    Person(s) Educated  Patient;Spouse    Methods  Explanation;Demonstration;Handout   wrote modifications on pt's OTAGA ex. sheet in his booklet   Comprehension  Verbalized understanding;Returned demonstration       PT Short Term Goals - 10/03/17 1719      PT SHORT TERM GOAL #1   Title  Pt will perform HEP with wife's supervision for improved balance, strength and gait.  TARGET 11/02/17    Time  5    Period  Weeks    Status  New    Target Date  11/02/17      PT SHORT TERM GOAL #2   Title  Pt will improve Berg Balance score to at least 32/56 for decreased fall risk.    Time  5    Period  Weeks    Status  New    Target Date  11/02/17      PT SHORT TERM GOAL #3   Title  Pt will improve TUG score to equal to or less than 28 seconds for decreased fall risk.    Time  5    Period  Weeks    Status  New    Target Date  11/02/17      PT SHORT TERM GOAL #4   Title  Pt will ambulate at least 300 ft using least restrictive assistive device (cane versus RW) modified independnetly for improved safety with gait.    Time  5    Period  Weeks    Status  New    Target Date  11/02/17      PT SHORT TERM GOAL #5   Title  Pt/wife will  verbalize understanding of fall prevention in home environment.    Time  5    Period  Weeks    Status  New    Target Date  11/02/17        PT Long Term Goals - 10/03/17 1944      PT LONG TERM GOAL #1   Title  Pt will improve gait  velocity score to at least 1.8 ft/sec for decreased fall risk.  TARGET 11/30/17    Time  9    Period  Weeks    Status  New    Target Date  11/30/17      PT LONG TERM GOAL #2   Title  Pt will improve Berg score to at least 37/56 for decreased fall risk.    Time  9    Period  Weeks    Status  New    Target Date  11/30/17      PT LONG TERM GOAL #3   Title  Pt will improve TUG score to less than or equal to 20 seconds for decreased fall risk.    Time  9    Period  Weeks    Status  New    Target Date  11/30/17      PT LONG TERM GOAL #4   Title  Pt will verbalize plans for ongoing community fitness post d/c.    Time  9    Period  Weeks    Status  New    Target Date  11/30/17            Plan - 10/12/17 1120    Clinical Impression Statement  Pt needs counter for UE support for balance exercises and to assist with achieving greater ROM with the trunk rotation and back extension exercises.  Pt is limited by postural abnormality (increased kyphosis which prevents pt from being able to stand erect)     Rehab Potential  Good    Clinical Impairments Affecting Rehab Potential  Good family support    PT Frequency  2x / week    PT Duration  8 weeks    PT Treatment/Interventions  ADLs/Self Care Home Management;DME Instruction;Balance training;Therapeutic exercise;Therapeutic activities;Functional mobility training;Gait training;Neuromuscular re-education;Patient/family education    PT Next Visit Plan  continue with OTAGA exercise program for HEP (I highlighted exercises in his booklet which he was given for HEP); check balance exercises added on 10-11-17 ; cont gait and balance training    PT Home Exercise Plan  OTAGA and balance exercises    Consulted and Agree with Plan of Care  Patient;Family member/caregiver    Family Member Consulted  wife, Hinton Dyer       Patient will benefit from skilled therapeutic intervention in order to improve the following deficits and impairments:  Abnormal  gait, Decreased balance, Difficulty walking, Decreased strength, Decreased mobility  Visit Diagnosis: Other abnormalities of gait and mobility  Unsteadiness on feet  Muscle weakness (generalized)     Problem List Patient Active Problem List   Diagnosis Date Noted  . BPH (benign prostatic hypertrophy) with urinary retention 11/05/2015  . AKI (acute kidney injury) ([place]) 08/15/2015  . Acute renal failure ([place]) 08/15/2015  . Constipation 08/15/2015  . Urinary retention 08/15/2015  . Hyperkalemia 08/15/2015    [place], PT 10/12/2017, 11:25 AM  [facility] [address] [place], [place], [address] Phone: [phone]   Fax:  [phone]  Name: [name] MRN: [id number] Date of  Birth: [birth year]

## 2017-10-16 DIAGNOSIS — C61 Malignant neoplasm of prostate: Secondary | ICD-10-CM | POA: Diagnosis not present

## 2017-10-16 DIAGNOSIS — N202 Calculus of kidney with calculus of ureter: Secondary | ICD-10-CM | POA: Diagnosis not present

## 2017-10-17 ENCOUNTER — Ambulatory Visit: Payer: Medicare Other | Attending: Internal Medicine | Admitting: Physical Therapy

## 2017-10-17 ENCOUNTER — Encounter: Payer: Self-pay | Admitting: Physical Therapy

## 2017-10-17 DIAGNOSIS — R293 Abnormal posture: Secondary | ICD-10-CM | POA: Insufficient documentation

## 2017-10-17 DIAGNOSIS — M6281 Muscle weakness (generalized): Secondary | ICD-10-CM | POA: Diagnosis not present

## 2017-10-17 DIAGNOSIS — R2689 Other abnormalities of gait and mobility: Secondary | ICD-10-CM | POA: Diagnosis not present

## 2017-10-17 DIAGNOSIS — R2681 Unsteadiness on feet: Secondary | ICD-10-CM | POA: Insufficient documentation

## 2017-10-17 NOTE — Therapy (Signed)
[facility] [address] [place], [place], [address] Phone: [phone]   Fax:  [phone]  Physical Therapy Treatment  Patient Details  Name: [name] MRN: [id number] Date of Birth: April 15, 1925 Referring Provider: Lavone Orn, MD   Encounter Date: 10/17/2017  PT End of Session - 10/17/17 0854    Visit Number  4    Number of Visits  17    Date for PT Re-Evaluation  12/31/17    Authorization Type  Medicare/Tricare-No PTA    PT Start Time  0847    PT Stop Time  0932    PT Time Calculation (min)  45 min    Activity Tolerance  Patient tolerated treatment well    Behavior During Therapy  [facility] for tasks assessed/performed       Past Medical History:  Diagnosis Date  . Arthritis    back "issues"  . Enlarged prostate   . Foley catheter in place    remains in place to leg bag.  Marland Kitchen Hearing difficulty of both ears    hearing aids-sometimes  . Hypothyroidism    recently diagnosed  . Neuromuscular disorder ([place])    periodic sciatic nerve pain left side.  . Renal failure (ARF), acute on chronic [facility])    7'17 episode of acute renal failure with hospital visit after Kyphoplasty procedure, urinary retention found, and Foley catheter placed to relieve.  . Sight impaired    left eye "detached retina"- limited vision left eye.    Past Surgical History:  Procedure Laterality Date  . ABDOMINAL HERNIA REPAIR    . BACK SURGERY     herniated disc  . CARPAL TUNNEL RELEASE Bilateral   . CATARACT EXTRACTION    . EYE SURGERY Left    x2 left eye detached retina-continues to be treated" some vision loss"  . HERNIA REPAIR Right    right groin hernia repair  . INSERTION OF SUPRAPUBIC CATHETER N/A 11/05/2015   Procedure: INSERTION OF SUPRAPUBIC CATHETER;  Surgeon: Kathie Rhodes, MD;  Location: [facility];  Service: Urology;  Laterality: N/A;  . KNEE ARTHROSCOPY WITH EXCISION BAKER'S CYST    . KYPHOPLASTY     08-03-15   .  SEPTOPLASTY    . TESTICLE REMOVAL Left    ? why  . TRANSURETHRAL RESECTION OF PROSTATE N/A 11/05/2015   Procedure: TRANSURETHRAL RESECTION OF THE PROSTATE (TURP) WITH GYRUS;  Surgeon: Kathie Rhodes, MD;  Location: [facility];  Service: Urology;  Laterality: N/A;  . VASECTOMY      There were no vitals filed for this visit.  Subjective Assessment - 10/17/17 0852    Subjective  reports limited compliance with OTAGO/HEP "I haven't done it like I should" - reports no particular reason other than laziness    Patient is accompained by:  Family member   wife   Patient Stated Goals  To try to get rid of pain; agrees to wife's goal of working on strength and balance.    Currently in Pain?  No/denies                       [facility] Adult PT Treatment/Exercise - 10/17/17 0001      Transfers   Transfers  Sit to Stand;Stand to Sit    Sit to Stand  5: Supervision    Sit to Stand Details  Verbal cues for technique;Verbal cues for precautions/safety    Stand to Sit  5: Supervision    Stand  to Sit Details (indicate cue type and reason)  Verbal cues for precautions/safety    Number of Reps  --   throughout session   Comments  B UE support required      Ambulation/Gait   Ambulation/Gait  Yes    Ambulation/Gait Assistance  5: Supervision    Ambulation Distance (Feet)  250 Feet    Assistive device  Straight cane    Gait Pattern  Step-through pattern;Trunk flexed    Ambulation Surface  Level;Indoor    Gait Comments  patient with intermittent use of SPC - at times carrying device      Posture/Postural Control   Posture/Postural Control  Postural limitations    Postural Limitations  Rounded Shoulders;Forward head;Increased thoracic kyphosis;Flexed trunk      Knee/Hip Exercises: Aerobic   Recumbent Bike  SciFit level 1.5 x 5" with UE's and LE's          Balance Exercises - 10/17/17 0855      OTAGO PROGRAM   Trunk Movements  Standing    Knee Extensor  10 reps    Knee Flexor  10  reps    Hip ABductor  10 reps    Ankle Plantorflexors  20 reps, support    Ankle Dorsiflexors  20 reps, support    Backwards Walking  Support   3 reps in // bars   One Leg Stand  10 seconds, support   3 reps each LE     Cueing throughout OTAGO for upright balance and extended knees as patient prefers a flexed posture. Requires multiple cues to use arm rests on chairs for sit to stand rather than reaching for other objects available to him. Performed all OTAGO activities with UE support at parallel bars with cueing to reduce grip/pressure to allow for hip/knee/trunk control.    PT Education - 10/17/17 1022    Education Details  continued review of OTAGO and posturing - education on need for UE support for balance    Person(s) Educated  Patient;Spouse    Methods  Explanation;Demonstration    Comprehension  Verbalized understanding;Returned demonstration       PT Short Term Goals - 10/03/17 1719      PT SHORT TERM GOAL #1   Title  Pt will perform HEP with wife's supervision for improved balance, strength and gait.  TARGET 11/02/17    Time  5    Period  Weeks    Status  New    Target Date  11/02/17      PT SHORT TERM GOAL #2   Title  Pt will improve Berg Balance score to at least 32/56 for decreased fall risk.    Time  5    Period  Weeks    Status  New    Target Date  11/02/17      PT SHORT TERM GOAL #3   Title  Pt will improve TUG score to equal to or less than 28 seconds for decreased fall risk.    Time  5    Period  Weeks    Status  New    Target Date  11/02/17      PT SHORT TERM GOAL #4   Title  Pt will ambulate at least 300 ft using least restrictive assistive device (cane versus RW) modified independnetly for improved safety with gait.    Time  5    Period  Weeks    Status  New    Target Date  11/02/17  PT SHORT TERM GOAL #5   Title  Pt/wife will verbalize understanding of fall prevention in home environment.    Time  5    Period  Weeks    Status  New     Target Date  11/02/17        PT Long Term Goals - 10/03/17 1944      PT LONG TERM GOAL #1   Title  Pt will improve gait velocity score to at least 1.8 ft/sec for decreased fall risk.  TARGET 11/30/17    Time  9    Period  Weeks    Status  New    Target Date  11/30/17      PT LONG TERM GOAL #2   Title  Pt will improve Berg score to at least 37/56 for decreased fall risk.    Time  9    Period  Weeks    Status  New    Target Date  11/30/17      PT LONG TERM GOAL #3   Title  Pt will improve TUG score to less than or equal to 20 seconds for decreased fall risk.    Time  9    Period  Weeks    Status  New    Target Date  11/30/17      PT LONG TERM GOAL #4   Title  Pt will verbalize plans for ongoing community fitness post d/c.    Time  9    Period  Weeks    Status  New    Target Date  11/30/17            Plan - 10/17/17 1024    Clinical Impression Statement  Patient and wife reporitng limited compliance with HEP thus far - educated on need to complete routinely for optimal carryover into the home and community environment. Progression of OTAGO program today with verbal/tactile cueing throughout to promote upright posturing and forward gaze as patient prefers downward gaze. Does require UE support thorughout session for steadying. Education on pushing up from arm chair and to reduce reaching for objects to reduce fall risk during transfers.     Rehab Potential  Good    Clinical Impairments Affecting Rehab Potential  Good family support    PT Frequency  2x / week    PT Duration  8 weeks    PT Treatment/Interventions  ADLs/Self Care Home Management;DME Instruction;Balance training;Therapeutic exercise;Therapeutic activities;Functional mobility training;Gait training;Neuromuscular re-education;Patient/family education    PT Next Visit Plan  continue with OTAGA exercise program for HEP (need to update pt booklet on exercises); check balance exercises added on 10-11-17 ; cont gait  and balance training    PT Home Exercise Plan  OTAGA and balance exercises    Consulted and Agree with Plan of Care  Patient;Family member/caregiver    Family Member Consulted  wife, Hinton Dyer       Patient will benefit from skilled therapeutic intervention in order to improve the following deficits and impairments:  Abnormal gait, Decreased balance, Difficulty walking, Decreased strength, Decreased mobility  Visit Diagnosis: Other abnormalities of gait and mobility  Unsteadiness on feet  Muscle weakness (generalized)  Abnormal posture     Problem List Patient Active Problem List   Diagnosis Date Noted  . BPH (benign prostatic hypertrophy) with urinary retention 11/05/2015  . AKI (acute kidney injury) ([place]) 08/15/2015  . Acute renal failure ([place]) 08/15/2015  . Constipation 08/15/2015  . Urinary retention 08/15/2015  . Hyperkalemia 08/15/2015  Lanney Gins, PT, DPT 10/17/17 10:36 AM Pager: [phone]   [facility] [address] [place], [place], [address] Phone: [phone]   Fax:  [phone]  Name: [name] MRN: [id number] Date of Birth: April 16, 1925

## 2017-10-19 ENCOUNTER — Encounter: Payer: Self-pay | Admitting: Physical Therapy

## 2017-10-19 ENCOUNTER — Ambulatory Visit: Payer: Medicare Other | Admitting: Physical Therapy

## 2017-10-19 DIAGNOSIS — R293 Abnormal posture: Secondary | ICD-10-CM

## 2017-10-19 DIAGNOSIS — M6281 Muscle weakness (generalized): Secondary | ICD-10-CM | POA: Diagnosis not present

## 2017-10-19 DIAGNOSIS — R2689 Other abnormalities of gait and mobility: Secondary | ICD-10-CM

## 2017-10-19 DIAGNOSIS — R2681 Unsteadiness on feet: Secondary | ICD-10-CM

## 2017-10-19 NOTE — Therapy (Signed)
[facility] [address] [place], [place], [address] Phone: [phone]   Fax:  [phone]  Physical Therapy Treatment  Patient Details  Name: [name] MRN: [id number] Date of Birth: [birth year] Referring Provider: Lavone Orn, MD   Encounter Date: 10/19/2017  PT End of Session - 10/19/17 1733    Visit Number  5    Number of Visits  17    Date for PT Re-Evaluation  12/31/17    Authorization Type  Medicare/Tricare-No PTA    PT Start Time  1102    PT Stop Time  1145    PT Time Calculation (min)  43 min    Activity Tolerance  Patient tolerated treatment well    Behavior During Therapy  [facility] for tasks assessed/performed       Past Medical History:  Diagnosis Date  . Arthritis    back "issues"  . Enlarged prostate   . Foley catheter in place    remains in place to leg bag.  Marland Kitchen Hearing difficulty of both ears    hearing aids-sometimes  . Hypothyroidism    recently diagnosed  . Neuromuscular disorder ([place])    periodic sciatic nerve pain left side.  . Renal failure (ARF), acute on chronic [facility])    7'17 episode of acute renal failure with hospital visit after Kyphoplasty procedure, urinary retention found, and Foley catheter placed to relieve.  . Sight impaired    left eye "detached retina"- limited vision left eye.    Past Surgical History:  Procedure Laterality Date  . ABDOMINAL HERNIA REPAIR    . BACK SURGERY     herniated disc  . CARPAL TUNNEL RELEASE Bilateral   . CATARACT EXTRACTION    . EYE SURGERY Left    x2 left eye detached retina-continues to be treated" some vision loss"  . HERNIA REPAIR Right    right groin hernia repair  . INSERTION OF SUPRAPUBIC CATHETER N/A 11/05/2015   Procedure: INSERTION OF SUPRAPUBIC CATHETER;  Surgeon: Kathie Rhodes, MD;  Location: [facility];  Service: Urology;  Laterality: N/A;  . KNEE ARTHROSCOPY WITH EXCISION BAKER'S CYST    . KYPHOPLASTY     08-03-15   .  SEPTOPLASTY    . TESTICLE REMOVAL Left    ? why  . TRANSURETHRAL RESECTION OF PROSTATE N/A 11/05/2015   Procedure: TRANSURETHRAL RESECTION OF THE PROSTATE (TURP) WITH GYRUS;  Surgeon: Kathie Rhodes, MD;  Location: [facility];  Service: Urology;  Laterality: N/A;  . VASECTOMY      There were no vitals filed for this visit.  Subjective Assessment - 10/19/17 1105    Subjective  No changes since last visit; not sure I'm doing all the things I should be doing.    Patient is accompained by:  Family member   wife   Patient Stated Goals  To try to get rid of pain; agrees to wife's goal of working on strength and balance.    Currently in Pain?  No/denies                       [facility] Adult PT Treatment/Exercise - 10/19/17 0001      Ambulation/Gait   Ambulation/Gait  Yes    Ambulation/Gait Assistance  5: Supervision    Ambulation/Gait Assistance Details  practiced gait x 40 ft x 2 with pt's hand's supported on therapists arms, with PT encouragin increased step length and heelstrike, more upright posture with gait.  Pt has  FIRM grip on PT's forearms bilaterally and feels unsteady with increasing step length.    Ambulation Distance (Feet)  100 Feet   115   Assistive device  Straight cane    Gait Pattern  Step-to pattern;Step-through pattern;Trunk flexed;Narrow base of support;Poor foot clearance - left;Poor foot clearance - right;Decreased stride length    Gait Comments  Attempted gait training with cues for cane sequence using RUE with LLE step; however, pt get easily out of sequence due to overall decreased stride length.  Discussed possible use of RW given pt's posture and fall risk and decreased overall step length with cane.  Pt objects to use of RW due to when he used it before, she felt it made him more flexed.  Pt is open to trying walker again.          Balance Exercises - 10/19/17 1105      Balance Exercises: Standing   Retro Gait  Upper extremity support   Forward/back  walking in parallel bars, bilat UE support   Marching Limitations  marching in place x 10 reps with bilateral UE support    Other Standing Exercises  STagger stance forward/back weightshfiting in parallel bars to attempt to encourage anterior excursion of pelvis and push off with gait.  Above standing exercises performed at parallel bars and mirror, with therapist provided verbal and tactile cues for upright posture.      OTAGO PROGRAM   Knee Extensor  10 reps    Knee Flexor  10 reps    Hip ABductor  10 reps    Ankle Plantorflexors  20 reps, support   10 reps   Ankle Dorsiflexors  20 reps, support   10 reps   Knee Bends  10 reps, support    Sideways Walking  Assistive device   along parallel bars 2 reps R and L   Sit to Stand  5 reps, bilateral support    Overall OTAGO Comments  Pt brought in OTAGO folder, and PT highlighted appropriate exercises for HEP.          PT Education - 10/19/17 1732    Education Details  Discussed potential benefits of trial use of rolling walker (posture, balance, safety with gait)    Person(s) Educated  Patient;Spouse    Methods  Explanation    Comprehension  Verbalized understanding   Wife objects to using cane; pt is open to trying      PT Short Term Goals - 10/03/17 1719      PT SHORT TERM GOAL #1   Title  Pt will perform HEP with wife's supervision for improved balance, strength and gait.  TARGET 11/02/17    Time  5    Period  Weeks    Status  New    Target Date  11/02/17      PT SHORT TERM GOAL #2   Title  Pt will improve Berg Balance score to at least 32/56 for decreased fall risk.    Time  5    Period  Weeks    Status  New    Target Date  11/02/17      PT SHORT TERM GOAL #3   Title  Pt will improve TUG score to equal to or less than 28 seconds for decreased fall risk.    Time  5    Period  Weeks    Status  New    Target Date  11/02/17      PT SHORT TERM GOAL #4  Title  Pt will ambulate at least 300 ft using least restrictive  assistive device (cane versus RW) modified independnetly for improved safety with gait.    Time  5    Period  Weeks    Status  New    Target Date  11/02/17      PT SHORT TERM GOAL #5   Title  Pt/wife will verbalize understanding of fall prevention in home environment.    Time  5    Period  Weeks    Status  New    Target Date  11/02/17        PT Long Term Goals - 10/03/17 1944      PT LONG TERM GOAL #1   Title  Pt will improve gait velocity score to at least 1.8 ft/sec for decreased fall risk.  TARGET 11/30/17    Time  9    Period  Weeks    Status  New    Target Date  11/30/17      PT LONG TERM GOAL #2   Title  Pt will improve Berg score to at least 37/56 for decreased fall risk.    Time  9    Period  Weeks    Status  New    Target Date  11/30/17      PT LONG TERM GOAL #3   Title  Pt will improve TUG score to less than or equal to 20 seconds for decreased fall risk.    Time  9    Period  Weeks    Status  New    Target Date  11/30/17      PT LONG TERM GOAL #4   Title  Pt will verbalize plans for ongoing community fitness post d/c.    Time  9    Period  Weeks    Status  New    Target Date  11/30/17            Plan - 10/19/17 1733    Clinical Impression Statement  Pt brought in his [place] folder today, and reviewed most of the exercises.  The current exercises for his HEP are highlighted in his OTAGO booklet.  While pt appears to benefit from visual cues of mirror and verbal/tactile cues from therapist for posture and step length, he tends to need frequent cueing and he appears more stable with bilateral UE support.  Discussed trying rolling walker for improved steadiness and posture with gait, but pt's wife is resistant (as she feels he did not use appropriately in the past and it made his posture worse), but pt is open to trying rolling walker.  Pt will benefit from further skilled PT for balance, strenghtening and gait training due to pt's risk of falls and  decreased balance.    Rehab Potential  Good    Clinical Impairments Affecting Rehab Potential  Good family support    PT Frequency  2x / week    PT Duration  8 weeks    PT Treatment/Interventions  ADLs/Self Care Home Management;DME Instruction;Balance training;Therapeutic exercise;Therapeutic activities;Functional mobility training;Gait training;Neuromuscular re-education;Patient/family education    PT Next Visit Plan  continue with OTAGA exercise program for HEP (need to update pt booklet on exercises); check balance exercises added on 10-11-17 ; cont gait and balance training    PT Home Exercise Plan  Continue balance exercises, gait training, ask again about trialing RW    Consulted and Agree with Plan of Care  Patient;Family member/caregiver  Family Member Consulted  wife, Hinton Dyer       Patient will benefit from skilled therapeutic intervention in order to improve the following deficits and impairments:  Abnormal gait, Decreased balance, Difficulty walking, Decreased strength, Decreased mobility  Visit Diagnosis: Unsteadiness on feet  Abnormal posture  Other abnormalities of gait and mobility     Problem List Patient Active Problem List   Diagnosis Date Noted  . BPH (benign prostatic hypertrophy) with urinary retention 11/05/2015  . AKI (acute kidney injury) ([place]) 08/15/2015  . Acute renal failure ([place]) 08/15/2015  . Constipation 08/15/2015  . Urinary retention 08/15/2015  . Hyperkalemia 08/15/2015    Lluvia Gwynne W. 10/19/2017, 5:37 PM Frazier Butt., PT  [facility] [address] [place], [place], [address] Phone: [phone]   Fax:  [phone]  Name: [name] MRN: [id number] Date of Birth: 01/08/1926

## 2017-10-23 ENCOUNTER — Encounter: Payer: Self-pay | Admitting: Physical Therapy

## 2017-10-23 ENCOUNTER — Ambulatory Visit: Payer: Medicare Other | Admitting: Physical Therapy

## 2017-10-23 DIAGNOSIS — R293 Abnormal posture: Secondary | ICD-10-CM

## 2017-10-23 DIAGNOSIS — R2689 Other abnormalities of gait and mobility: Secondary | ICD-10-CM | POA: Diagnosis not present

## 2017-10-23 DIAGNOSIS — R2681 Unsteadiness on feet: Secondary | ICD-10-CM | POA: Diagnosis not present

## 2017-10-23 DIAGNOSIS — M6281 Muscle weakness (generalized): Secondary | ICD-10-CM | POA: Diagnosis not present

## 2017-10-23 NOTE — Therapy (Signed)
[facility] [address] [place], [place], [address] Phone: [phone]   Fax:  [phone]  Physical Therapy Treatment  Patient Details  Name: [name] MRN: [id number] Date of Birth: 05/02/1925 Referring Provider: Lavone Orn, MD   Encounter Date: 10/23/2017  PT End of Session - 10/23/17 1020    Visit Number  6    Number of Visits  17    Date for PT Re-Evaluation  12/31/17    Authorization Type  Medicare/Tricare-No PTA    PT Start Time  1017    PT Stop Time  1100    PT Time Calculation (min)  43 min    Activity Tolerance  Patient tolerated treatment well    Behavior During Therapy  [facility] for tasks assessed/performed       Past Medical History:  Diagnosis Date  . Arthritis    back "issues"  . Enlarged prostate   . Foley catheter in place    remains in place to leg bag.  Marland Kitchen Hearing difficulty of both ears    hearing aids-sometimes  . Hypothyroidism    recently diagnosed  . Neuromuscular disorder ([place])    periodic sciatic nerve pain left side.  . Renal failure (ARF), acute on chronic [facility])    7'17 episode of acute renal failure with hospital visit after Kyphoplasty procedure, urinary retention found, and Foley catheter placed to relieve.  . Sight impaired    left eye "detached retina"- limited vision left eye.    Past Surgical History:  Procedure Laterality Date  . ABDOMINAL HERNIA REPAIR    . BACK SURGERY     herniated disc  . CARPAL TUNNEL RELEASE Bilateral   . CATARACT EXTRACTION    . EYE SURGERY Left    x2 left eye detached retina-continues to be treated" some vision loss"  . HERNIA REPAIR Right    right groin hernia repair  . INSERTION OF SUPRAPUBIC CATHETER N/A 11/05/2015   Procedure: INSERTION OF SUPRAPUBIC CATHETER;  Surgeon: Kathie Rhodes, MD;  Location: [facility];  Service: Urology;  Laterality: N/A;  . KNEE ARTHROSCOPY WITH EXCISION BAKER'S CYST    . KYPHOPLASTY     08-03-15   .  SEPTOPLASTY    . TESTICLE REMOVAL Left    ? why  . TRANSURETHRAL RESECTION OF PROSTATE N/A 11/05/2015   Procedure: TRANSURETHRAL RESECTION OF THE PROSTATE (TURP) WITH GYRUS;  Surgeon: Kathie Rhodes, MD;  Location: [facility];  Service: Urology;  Laterality: N/A;  . VASECTOMY      There were no vitals filed for this visit.  Subjective Assessment - 10/23/17 1019    Subjective  "I haven't been doing what I should" patients wife continues to be resistant to idea of using RW    Patient is accompained by:  Family member   wife   Patient Stated Goals  To try to get rid of pain; agrees to wife's goal of working on strength and balance.    Currently in Pain?  No/denies    Pain Score  0-No pain                       [facility] Adult PT Treatment/Exercise - 10/23/17 1026      Transfers   Transfers  Sit to Stand;Stand to Sit    Sit to Stand  5: Supervision    Sit to Stand Details  Verbal cues for technique;Verbal cues for precautions/safety    Sit to Stand Details (  indicate cue type and reason)  verbal cueing for hand placement for safety and efficiency    Stand to Sit  5: Supervision    Stand to Sit Details (indicate cue type and reason)  Verbal cues for technique;Verbal cues for precautions/safety    Comments  B UE support to complete at supervision level; Light min A required with 1 UE from mat table.       Ambulation/Gait   Ambulation/Gait  Yes    Ambulation/Gait Assistance  5: Supervision    Ambulation/Gait Assistance Details  short shuffle steps requiring verbal cueing for increased step/stride length    Ambulation Distance (Feet)  200 Feet    Assistive device  Straight cane    Gait Pattern  Step-to pattern;Step-through pattern;Trunk flexed;Narrow base of support;Poor foot clearance - left;Poor foot clearance - right;Decreased stride length      Neuro Re-ed    Neuro Re-ed Details   --          Balance Exercises - 10/23/17 1121      Balance Exercises: Standing   Tandem  Stance  Eyes open;Upper extremity support 2;Foam/compliant surface   wide tandem for weight shifting x 2 min each LE   Step Ups  Forward;6 inch;UE support 2   limited spatial awareness with poor foot placement on step   Retro Gait  Upper extremity support   2 x 10 feet with verbal cueing to increase step length    Sidestepping  Upper extremity support;2 reps   ~10 ft   Marching Limitations  toe taps to 6" step with unilateral/bilateral UE support - poor foot clearance from step with patient preferring to drag LE     Sit to Stand Time  sit to stand form mat table - verbal cueing for positioning and sequencing - S for B UE support, light Min A for unilateral UE support        PT Education - 10/23/17 1105    Education Details  continued discussion of use of RW in hopes of improving gait, balance and posture    Person(s) Educated  Patient;Spouse    Methods  Explanation    Comprehension  Verbalized understanding       PT Short Term Goals - 10/03/17 1719      PT SHORT TERM GOAL #1   Title  Pt will perform HEP with wife's supervision for improved balance, strength and gait.  TARGET 11/02/17    Time  5    Period  Weeks    Status  New    Target Date  11/02/17      PT SHORT TERM GOAL #2   Title  Pt will improve Berg Balance score to at least 32/56 for decreased fall risk.    Time  5    Period  Weeks    Status  New    Target Date  11/02/17      PT SHORT TERM GOAL #3   Title  Pt will improve TUG score to equal to or less than 28 seconds for decreased fall risk.    Time  5    Period  Weeks    Status  New    Target Date  11/02/17      PT SHORT TERM GOAL #4   Title  Pt will ambulate at least 300 ft using least restrictive assistive device (cane versus RW) modified independnetly for improved safety with gait.    Time  5    Period  Weeks  Status  New    Target Date  11/02/17      PT SHORT TERM GOAL #5   Title  Pt/wife will verbalize understanding of fall prevention in home  environment.    Time  5    Period  Weeks    Status  New    Target Date  11/02/17        PT Long Term Goals - 10/03/17 1944      PT LONG TERM GOAL #1   Title  Pt will improve gait velocity score to at least 1.8 ft/sec for decreased fall risk.  TARGET 11/30/17    Time  9    Period  Weeks    Status  New    Target Date  11/30/17      PT LONG TERM GOAL #2   Title  Pt will improve Berg score to at least 37/56 for decreased fall risk.    Time  9    Period  Weeks    Status  New    Target Date  11/30/17      PT LONG TERM GOAL #3   Title  Pt will improve TUG score to less than or equal to 20 seconds for decreased fall risk.    Time  9    Period  Weeks    Status  New    Target Date  11/30/17      PT LONG TERM GOAL #4   Title  Pt will verbalize plans for ongoing community fitness post d/c.    Time  9    Period  Weeks    Status  New    Target Date  11/30/17            Plan - 10/23/17 1105    Clinical Impression Statement  Patient and wife continue to report limited compliance with HEP thus far. Educated on need to perform with regularity at home for full benefit and carryover into the home and community environment. Also with continued discussion/education on trial use of RW in hoped of improving gait, balance, safety and posture with mobility. Much of time spent today on education on safe transfers as patient treaches for objects/chair and attempts to sit prior to complete turn increasing his fall risk. Difficulty with clearing foot during toe taps to 6" step as well as reduced spatial awareness during step up. Cueing throughout for postural awareness. Will continue to benefit from skilled PT intervention.     Rehab Potential  Good    Clinical Impairments Affecting Rehab Potential  Good family support    PT Frequency  2x / week    PT Duration  8 weeks    PT Treatment/Interventions  ADLs/Self Care Home Management;DME Instruction;Balance training;Therapeutic exercise;Therapeutic  activities;Functional mobility training;Gait training;Neuromuscular re-education;Patient/family education    PT Next Visit Plan  continue with OTAGA exercise program for HEP (need to update pt booklet on exercises); check balance exercises added on 10-11-17 ; cont gait and balance training    PT Home Exercise Plan  Continue balance exercises, gait training, trial RW    Consulted and Agree with Plan of Care  Patient;Family member/caregiver    Family Member Consulted  wife, Hinton Dyer       Patient will benefit from skilled therapeutic intervention in order to improve the following deficits and impairments:  Abnormal gait, Decreased balance, Difficulty walking, Decreased strength, Decreased mobility  Visit Diagnosis: Unsteadiness on feet  Abnormal posture  Other abnormalities of gait and mobility  Muscle  weakness (generalized)     Problem List Patient Active Problem List   Diagnosis Date Noted  . BPH (benign prostatic hypertrophy) with urinary retention 11/05/2015  . AKI (acute kidney injury) ([place]) 08/15/2015  . Acute renal failure ([place]) 08/15/2015  . Constipation 08/15/2015  . Urinary retention 08/15/2015  . Hyperkalemia 08/15/2015    Lanney Gins, PT, DPT 10/23/17 11:27 AM Pager: [phone]   [facility] [address] [place], [place], [address] Phone: [phone]   Fax:  [phone]  Name: SOSAIA PITTINGER MRN: [id number] Date of Birth: Mar 28, 1925

## 2017-10-25 ENCOUNTER — Ambulatory Visit: Payer: Medicare Other | Admitting: Physical Therapy

## 2017-10-25 ENCOUNTER — Encounter: Payer: Self-pay | Admitting: Physical Therapy

## 2017-10-25 DIAGNOSIS — M6281 Muscle weakness (generalized): Secondary | ICD-10-CM

## 2017-10-25 DIAGNOSIS — R2681 Unsteadiness on feet: Secondary | ICD-10-CM | POA: Diagnosis not present

## 2017-10-25 DIAGNOSIS — R293 Abnormal posture: Secondary | ICD-10-CM | POA: Diagnosis not present

## 2017-10-25 DIAGNOSIS — R2689 Other abnormalities of gait and mobility: Secondary | ICD-10-CM

## 2017-10-25 DIAGNOSIS — H401132 Primary open-angle glaucoma, bilateral, moderate stage: Secondary | ICD-10-CM | POA: Diagnosis not present

## 2017-10-25 NOTE — Therapy (Signed)
[facility] [address] [place], [place], [address] Phone: [phone]   Fax:  [phone]  Physical Therapy Treatment  Patient Details  Name: [name] MRN: [id number] Date of Birth: 1925/05/31 Referring Provider: Lavone Orn, MD   Encounter Date: 10/25/2017  PT End of Session - 10/25/17 1828    Visit Number  7    Number of Visits  17    Date for PT Re-Evaluation  12/31/17    Authorization Type  Medicare/Tricare-No PTA    PT Start Time  1020    PT Stop Time  1102    PT Time Calculation (min)  42 min    Activity Tolerance  Patient tolerated treatment well    Behavior During Therapy  [facility] for tasks assessed/performed       Past Medical History:  Diagnosis Date  . Arthritis    back "issues"  . Enlarged prostate   . Foley catheter in place    remains in place to leg bag.  Marland Kitchen Hearing difficulty of both ears    hearing aids-sometimes  . Hypothyroidism    recently diagnosed  . Neuromuscular disorder ([place])    periodic sciatic nerve pain left side.  . Renal failure (ARF), acute on chronic [facility])    7'17 episode of acute renal failure with hospital visit after Kyphoplasty procedure, urinary retention found, and Foley catheter placed to relieve.  . Sight impaired    left eye "detached retina"- limited vision left eye.    Past Surgical History:  Procedure Laterality Date  . ABDOMINAL HERNIA REPAIR    . BACK SURGERY     herniated disc  . CARPAL TUNNEL RELEASE Bilateral   . CATARACT EXTRACTION    . EYE SURGERY Left    x2 left eye detached retina-continues to be treated" some vision loss"  . HERNIA REPAIR Right    right groin hernia repair  . INSERTION OF SUPRAPUBIC CATHETER N/A 11/05/2015   Procedure: INSERTION OF SUPRAPUBIC CATHETER;  Surgeon: Kathie Rhodes, MD;  Location: [facility];  Service: Urology;  Laterality: N/A;  . KNEE ARTHROSCOPY WITH EXCISION BAKER'S CYST    . KYPHOPLASTY     08-03-15   .  SEPTOPLASTY    . TESTICLE REMOVAL Left    ? why  . TRANSURETHRAL RESECTION OF PROSTATE N/A 11/05/2015   Procedure: TRANSURETHRAL RESECTION OF THE PROSTATE (TURP) WITH GYRUS;  Surgeon: Kathie Rhodes, MD;  Location: [facility];  Service: Urology;  Laterality: N/A;  . VASECTOMY      There were no vitals filed for this visit.  Subjective Assessment - 10/25/17 1023    Subjective  No changes, no falls, no questions. Wife reports he often gets upset with her as she tries to instruct him in his exercises. He often refuses to do them.     Patient is accompained by:  Family member   wife   Patient Stated Goals  To try to get rid of pain; agrees to wife's goal of working on strength and balance.    Currently in Pain?  No/denies                       [facility] Adult PT Treatment/Exercise - 10/25/17 0001      Transfers   Transfers  Sit to Stand;Stand to Sit    Sit to Stand  5: Supervision    Sit to Stand Details  Verbal cues for technique    Stand to Sit  5: Supervision    Stand to Sit Details (indicate cue type and reason)  Verbal cues for technique    Comments  bil UE support      Ambulation/Gait   Ambulation/Gait Assistance  5: Supervision    Ambulation/Gait Assistance Details  short shuffling steps; vc for looking forward and standing upright; vc for sequencing with his cane   Ambulation Distance (Feet)  50 Feet   20x 2   Assistive device  Straight cane    Gait Pattern  Step-to pattern;Step-through pattern;Trunk flexed;Narrow base of support;Poor foot clearance - left;Poor foot clearance - right;Decreased stride length;Right foot flat;Left foot flat;Right flexed knee in stance;Left flexed knee in stance    Ambulation Surface  Indoor          Balance Exercises - 10/25/17 1831      OTAGO PROGRAM   Back Extension  Standing;Other reps (comment)   x2 with several minute hold; back to counter   Backwards Walking  Support   4 lengths of counter   Tandem Walk  Support   3  lengths of counter   One Leg Stand  10 seconds, support    Sit to Stand  5 reps, bilateral support    Overall OTAGO Comments  completed education in remaining exercises (and removed those that pt unable to do safely); constant cues for upright posture and looking straight ahead        PT Education - 10/25/17 1836    Education Details  completed education re: technique for [facility] HEP; instructed in frequency of 3x/week; discussed that he will likely do better to choose a time of day to set aside for exercises (vs trying to remember to incorporate them throughtout his day) due to decr cognition       PT Short Term Goals - 10/03/17 1719      PT SHORT TERM GOAL #1   Title  Pt will perform HEP with wife's supervision for improved balance, strength and gait.  TARGET 11/02/17    Time  5    Period  Weeks    Status  New    Target Date  11/02/17      PT SHORT TERM GOAL #2   Title  Pt will improve Berg Balance score to at least 32/56 for decreased fall risk.    Time  5    Period  Weeks    Status  New    Target Date  11/02/17      PT SHORT TERM GOAL #3   Title  Pt will improve TUG score to equal to or less than 28 seconds for decreased fall risk.    Time  5    Period  Weeks    Status  New    Target Date  11/02/17      PT SHORT TERM GOAL #4   Title  Pt will ambulate at least 300 ft using least restrictive assistive device (cane versus RW) modified independnetly for improved safety with gait.    Time  5    Period  Weeks    Status  New    Target Date  11/02/17      PT SHORT TERM GOAL #5   Title  Pt/wife will verbalize understanding of fall prevention in home environment.    Time  5    Period  Weeks    Status  New    Target Date  11/02/17        PT Long Term Goals - 10/03/17  [place] #1   Title  Pt will improve gait velocity score to at least 1.8 ft/sec for decreased fall risk.  TARGET 11/30/17    Time  9    Period  Weeks    Status  New    Target Date   11/30/17      PT LONG TERM GOAL #2   Title  Pt will improve Berg score to at least 37/56 for decreased fall risk.    Time  9    Period  Weeks    Status  New    Target Date  11/30/17      PT LONG TERM GOAL #3   Title  Pt will improve TUG score to less than or equal to 20 seconds for decreased fall risk.    Time  9    Period  Weeks    Status  New    Target Date  11/30/17      PT LONG TERM GOAL #4   Title  Pt will verbalize plans for ongoing community fitness post d/c.    Time  9    Period  Weeks    Status  New    Target Date  11/30/17            Plan - 10/25/17 1839    Clinical Impression Statement  Session focused on completing assessment of safe exercises from [place] program for pt to complete at home. Educated re: frequency of exercises and rationale for completing them (he was able to state his goal is to not fall and get hurt; explained how these exercises were designed just for that purpose). Wife very attentive throughout and provides appropriate cues to pt to improve technique>     Rehab Potential  Good    Clinical Impairments Affecting Rehab Potential  Good family support    PT Frequency  2x / week    PT Duration  8 weeks    PT Treatment/Interventions  ADLs/Self Care Home Management;DME Instruction;Balance training;Therapeutic exercise;Therapeutic activities;Functional mobility training;Gait training;Neuromuscular re-education;Patient/family education    PT Next Visit Plan  continue with OTAGA exercise program for HEP (need to update pt booklet on exercises);  cont gait and balance training  (last sched appt 9/19 but plan thru 10/ 18--?more appts)    PT Home Exercise Plan  Continue balance exercises, gait training, trial RW    Consulted and Agree with Plan of Care  Patient;Family member/caregiver    Family Member Consulted  wife, Hinton Dyer       Patient will benefit from skilled therapeutic intervention in order to improve the following deficits and impairments:  Abnormal  gait, Decreased balance, Difficulty walking, Decreased strength, Decreased mobility  Visit Diagnosis: Unsteadiness on feet  Abnormal posture  Other abnormalities of gait and mobility  Muscle weakness (generalized)     Problem List Patient Active Problem List   Diagnosis Date Noted  . BPH (benign prostatic hypertrophy) with urinary retention 11/05/2015  . AKI (acute kidney injury) ([place]) 08/15/2015  . Acute renal failure ([place]) 08/15/2015  . Constipation 08/15/2015  . Urinary retention 08/15/2015  . Hyperkalemia 08/15/2015    [name], PT 10/25/2017, 6:47 PM  [facility] [address] [place], [place], [address] Phone: [phone]   Fax:  [phone]  Name: [name] MRN: [id number] Date of Birth: 1925-08-27

## 2017-10-30 ENCOUNTER — Ambulatory Visit: Payer: Medicare Other | Admitting: Physical Therapy

## 2017-10-30 ENCOUNTER — Encounter: Payer: Self-pay | Admitting: Physical Therapy

## 2017-10-30 DIAGNOSIS — R2681 Unsteadiness on feet: Secondary | ICD-10-CM

## 2017-10-30 DIAGNOSIS — M6281 Muscle weakness (generalized): Secondary | ICD-10-CM

## 2017-10-30 DIAGNOSIS — R2689 Other abnormalities of gait and mobility: Secondary | ICD-10-CM

## 2017-10-30 DIAGNOSIS — R293 Abnormal posture: Secondary | ICD-10-CM | POA: Diagnosis not present

## 2017-10-31 NOTE — Therapy (Signed)
[facility] [address] [place], [place], [address] Phone: [phone]   Fax:  [phone]  Physical Therapy Treatment  Patient Details  Name: [name] MRN: [id number] Date of Birth: 1925-09-27 Referring Provider: Lavone Orn, MD   Encounter Date: 10/30/2017  PT End of Session - 10/30/17 1700    Visit Number  8    Number of Visits  17    Date for PT Re-Evaluation  12/31/17    Authorization Type  Medicare/Tricare-No PTA    PT Start Time  1105   late arrival   PT Stop Time  1147    PT Time Calculation (min)  42 min    Activity Tolerance  Patient tolerated treatment well    Behavior During Therapy  [facility] for tasks assessed/performed       Past Medical History:  Diagnosis Date  . Arthritis    back "issues"  . Enlarged prostate   . Foley catheter in place    remains in place to leg bag.  Marland Kitchen Hearing difficulty of both ears    hearing aids-sometimes  . Hypothyroidism    recently diagnosed  . Neuromuscular disorder ([place])    periodic sciatic nerve pain left side.  . Renal failure (ARF), acute on chronic [facility])    7'17 episode of acute renal failure with hospital visit after Kyphoplasty procedure, urinary retention found, and Foley catheter placed to relieve.  . Sight impaired    left eye "detached retina"- limited vision left eye.    Past Surgical History:  Procedure Laterality Date  . ABDOMINAL HERNIA REPAIR    . BACK SURGERY     herniated disc  . CARPAL TUNNEL RELEASE Bilateral   . CATARACT EXTRACTION    . EYE SURGERY Left    x2 left eye detached retina-continues to be treated" some vision loss"  . HERNIA REPAIR Right    right groin hernia repair  . INSERTION OF SUPRAPUBIC CATHETER N/A 11/05/2015   Procedure: INSERTION OF SUPRAPUBIC CATHETER;  Surgeon: Kathie Rhodes, MD;  Location: [facility];  Service: Urology;  Laterality: N/A;  . KNEE ARTHROSCOPY WITH EXCISION BAKER'S CYST    . KYPHOPLASTY     08-03-15   . SEPTOPLASTY    . TESTICLE REMOVAL Left    ? why  . TRANSURETHRAL RESECTION OF PROSTATE N/A 11/05/2015   Procedure: TRANSURETHRAL RESECTION OF THE PROSTATE (TURP) WITH GYRUS;  Surgeon: Kathie Rhodes, MD;  Location: [facility];  Service: Urology;  Laterality: N/A;  . VASECTOMY      There were no vitals filed for this visit.  Subjective Assessment - 10/30/17 1106    Subjective  Still not doing his exercises. Wife has offered and he refuses. Ultimately he agreed to wife's suggestion that he do exercises before he can take his afternoon nap.     Patient is accompained by:  Family member   wife   Pertinent History  HOH, sciatica, left eye detached retina, back sx,     Patient Stated Goals  To try to get rid of pain; agrees to wife's goal of working on strength and balance.    Currently in Pain?  No/denies                       [facility] Adult PT Treatment/Exercise - 10/30/17 2227      Transfers   Transfers  Sit to Stand;Stand to Sit    Sit to Stand  4: Min guard;With upper  extremity assist;From elevated surface   hi/lo mat tabel   Sit to Stand Details (indicate cue type and reason)  vc for safe, proper sequencing; attempting to be consistent in how directions provided    Stand to Sit  5: Supervision    Stand to Sit Details (indicate cue type and reason)  Verbal cues for technique    Comments  bil UE support      Ambulation/Gait   Ambulation/Gait Assistance  4: Min guard    Ambulation/Gait Assistance Details  short shuffling gait, flexed posture; vc for correction     Ambulation Distance (Feet)  60 Feet   20 x 2;    Assistive device  Straight cane    Gait Pattern  Step-to pattern;Step-through pattern;Trunk flexed;Narrow base of support;Poor foot clearance - left;Poor foot clearance - right;Decreased stride length;Right foot flat;Left foot flat;Right flexed knee in stance;Left flexed knee in stance    Ambulation Surface  Indoor          Balance Exercises -  10/30/17 2222      OTAGO PROGRAM   Head Movements  Sitting;5 reps    Neck Movements  Sitting;5 reps    Back Extension  Standing;5 reps   counter behind his hips   Trunk Movements  Standing;5 reps   back to counter, one hand on walker one on counter   Knee Flexor  10 reps    Hip ABductor  10 reps    Ankle Plantorflexors  --   10 reps bil UE support   Ankle Dorsiflexors  --   10 reps support   Knee Bends  10 reps, support    Backwards Walking  Support    Sideways Walking  Assistive device    Tandem Stance  10 seconds, support    One Leg Stand  10 seconds, support    Sit to Stand  5 reps, one support    Overall OTAGO Comments  due to pt's lack of compliande at home, repeated HEP with pt needing instructional cues througnout due to dementia;           PT Short Term Goals - 10/03/17 1719      PT SHORT TERM GOAL #1   Title  Pt will perform HEP with wife's supervision for improved balance, strength and gait.  TARGET 11/02/17    Time  5    Period  Weeks    Status  New    Target Date  11/02/17      PT SHORT TERM GOAL #2   Title  Pt will improve Berg Balance score to at least 32/56 for decreased fall risk.    Time  5    Period  Weeks    Status  New    Target Date  11/02/17      PT SHORT TERM GOAL #3   Title  Pt will improve TUG score to equal to or less than 28 seconds for decreased fall risk.    Time  5    Period  Weeks    Status  New    Target Date  11/02/17      PT SHORT TERM GOAL #4   Title  Pt will ambulate at least 300 ft using least restrictive assistive device (cane versus RW) modified independnetly for improved safety with gait.    Time  5    Period  Weeks    Status  New    Target Date  11/02/17      PT SHORT  TERM GOAL #5   Title  Pt/wife will verbalize understanding of fall prevention in home environment.    Time  5    Period  Weeks    Status  New    Target Date  11/02/17        PT Long Term Goals - 10/03/17 1944      PT LONG TERM GOAL #1   Title   Pt will improve gait velocity score to at least 1.8 ft/sec for decreased fall risk.  TARGET 11/30/17    Time  9    Period  Weeks    Status  New    Target Date  11/30/17      PT LONG TERM GOAL #2   Title  Pt will improve Berg score to at least 37/56 for decreased fall risk.    Time  9    Period  Weeks    Status  New    Target Date  11/30/17      PT LONG TERM GOAL #3   Title  Pt will improve TUG score to less than or equal to 20 seconds for decreased fall risk.    Time  9    Period  Weeks    Status  New    Target Date  11/30/17      PT LONG TERM GOAL #4   Title  Pt will verbalize plans for ongoing community fitness post d/c.    Time  9    Period  Weeks    Status  New    Target Date  11/30/17            Plan - 10/30/17 1700    Clinical Impression Statement  Patient continues to refuse wife's encouragement to do HEP. Wife very upset with patient and discussing he will need to go to a SNF if he doesn't start moving more. Patient eventually agreed to an "assigned" time each day for exercises--after lunch but before he can take his nap. As pt has not been doing ex's at home, repeated [place] instruction with pt rquiring mod to max cues for technique. Discussed with wife that she needs to choose one-two elements of his form/posture to focus on when cuing pt and not expect him to be able to do every aspect of exercise correctly at this time (i.e. focus on upright head and shoulders or fully extending knees in standing). She tends to overwhelm pt with too many cues. Next session will be final session with STGs to be assessed.     Rehab Potential  Good    Clinical Impairments Affecting Rehab Potential  Good family support    PT Frequency  2x / week    PT Duration  8 weeks    PT Treatment/Interventions  ADLs/Self Care Home Management;DME Instruction;Balance training;Therapeutic exercise;Therapeutic activities;Functional mobility training;Gait training;Neuromuscular  re-education;Patient/family education    PT Next Visit Plan  chk STG's, give fall prevention info, answer questions, discharge; continue with OTAGA exercise program     PT Home Exercise Plan  Continue balance exercises, gait training, trial RW    Consulted and Agree with Plan of Care  Patient;Family member/caregiver    Family Member Consulted  wife, Hinton Dyer       Patient will benefit from skilled therapeutic intervention in order to improve the following deficits and impairments:  Abnormal gait, Decreased balance, Difficulty walking, Decreased strength, Decreased mobility  Visit Diagnosis: Unsteadiness on feet  Abnormal posture  Other abnormalities of gait and mobility  Muscle  weakness (generalized)     Problem List Patient Active Problem List   Diagnosis Date Noted  . BPH (benign prostatic hypertrophy) with urinary retention 11/05/2015  . AKI (acute kidney injury) ([place]) 08/15/2015  . Acute renal failure ([place]) 08/15/2015  . Constipation 08/15/2015  . Urinary retention 08/15/2015  . Hyperkalemia 08/15/2015    Rexanne Mano, PT 10/31/2017, 5:13 AM  [facility] [address] [place], [place], [address] Phone: [phone]   Fax:  [phone]  Name: ASANTE BLANDA MRN: [id number] Date of Birth: 1925-06-23

## 2017-11-01 ENCOUNTER — Encounter: Payer: Self-pay | Admitting: Physical Therapy

## 2017-11-01 ENCOUNTER — Ambulatory Visit: Payer: Medicare Other | Admitting: Physical Therapy

## 2017-11-01 DIAGNOSIS — R2681 Unsteadiness on feet: Secondary | ICD-10-CM | POA: Diagnosis not present

## 2017-11-01 DIAGNOSIS — R2689 Other abnormalities of gait and mobility: Secondary | ICD-10-CM

## 2017-11-01 DIAGNOSIS — M6281 Muscle weakness (generalized): Secondary | ICD-10-CM | POA: Diagnosis not present

## 2017-11-01 DIAGNOSIS — R293 Abnormal posture: Secondary | ICD-10-CM

## 2017-11-01 NOTE — Patient Instructions (Addendum)

## 2017-11-02 NOTE — Therapy (Signed)
[facility] [address] [place], [place], [address] Phone: [phone]   Fax:  [phone]  Physical Therapy Treatment and Discharge Summary  Patient Details  Name: [name] MRN: [id number] Date of Birth: 11-03-1925 Referring Provider: Lavone Orn, MD   Encounter Date: 11/01/2017  PT End of Session - 11/02/17 0659    Visit Number  9    Number of Visits  17    Date for PT Re-Evaluation  12/31/17    Authorization Type  Medicare/Tricare-No PTA    PT Start Time  1405    PT Stop Time  1445    PT Time Calculation (min)  40 min    Activity Tolerance  Patient tolerated treatment well    Behavior During Therapy  [facility] for tasks assessed/performed       Past Medical History:  Diagnosis Date  . Arthritis    back "issues"  . Enlarged prostate   . Foley catheter in place    remains in place to leg bag.  [place] Hearing difficulty of both ears    hearing aids-sometimes  . Hypothyroidism    recently diagnosed  . Neuromuscular disorder ([place])    periodic sciatic nerve pain left side.  . Renal failure (ARF), acute on chronic [facility])    7'17 episode of acute renal failure with hospital visit after Kyphoplasty procedure, urinary retention found, and Foley catheter placed to relieve.  . Sight impaired    left eye "detached retina"- limited vision left eye.    Past Surgical History:  Procedure Laterality Date  . ABDOMINAL HERNIA REPAIR    . BACK SURGERY     herniated disc  . CARPAL TUNNEL RELEASE Bilateral   . CATARACT EXTRACTION    . EYE SURGERY Left    x2 left eye detached retina-continues to be treated" some vision loss"  . HERNIA REPAIR Right    right groin hernia repair  . INSERTION OF SUPRAPUBIC CATHETER N/A 11/05/2015   Procedure: INSERTION OF SUPRAPUBIC CATHETER;  Surgeon: Kathie Rhodes, MD;  Location: [facility];  Service: Urology;  Laterality: N/A;  . KNEE ARTHROSCOPY WITH EXCISION BAKER'S CYST    . KYPHOPLASTY      08-03-15   . SEPTOPLASTY    . TESTICLE REMOVAL Left    ? why  . TRANSURETHRAL RESECTION OF PROSTATE N/A 11/05/2015   Procedure: TRANSURETHRAL RESECTION OF THE PROSTATE (TURP) WITH GYRUS;  Surgeon: Kathie Rhodes, MD;  Location: [facility];  Service: Urology;  Laterality: N/A;  . VASECTOMY      There were no vitals filed for this visit.  Subjective Assessment - 11/01/17 1410    Subjective  Did his exercises yesterday and could tell his neck was sore from stretching.     Patient is accompained by:  Family member   wife   Pertinent History  HOH, sciatica, left eye detached retina, back sx,     Patient Stated Goals  To try to get rid of pain; agrees to wife's goal of working on strength and balance.    Currently in Pain?  No/denies         [facility] PT Assessment - 11/01/17 1421      Ambulation/Gait   Ambulation/Gait  Yes    Ambulation/Gait Assistance  5: Supervision    Ambulation/Gait Assistance Details  short shuffling gait, flexed posture; vc for correction     Ambulation Distance (Feet)  130 Feet   7 minutes   Assistive device  Straight cane  Gait Pattern  Step-to pattern;Step-through pattern;Trunk flexed;Narrow base of support;Poor foot clearance - left;Poor foot clearance - right;Decreased stride length;Right foot flat;Left foot flat;Right flexed knee in stance;Left flexed knee in stance    Ambulation Surface  Indoor      Berg Balance Test   Sit to Stand  Able to stand  independently using hands    Standing Unsupported  Able to stand 2 minutes with supervision    Sitting with Back Unsupported but Feet Supported on Floor or Stool  Able to sit safely and securely 2 minutes    Stand to Sit  Controls descent by using hands    Transfers  Needs one person to assist    Standing Unsupported with Eyes Closed  Able to stand 10 seconds with supervision    Standing Ubsupported with Feet Together  Able to place feet together independently and stand for 1 minute with supervision    From Standing,  Reach Forward with Outstretched Arm  Can reach forward >12 cm safely (5")    From Standing Position, Pick up Object from Floor  Able to pick up shoe, needs supervision    From Standing Position, Turn to Look Behind Over each Shoulder  Needs supervision when turning    Turn 360 Degrees  Needs close supervision or verbal cueing    Standing Unsupported, Alternately Place Feet on Step/Stool  Needs assistance to keep from falling or unable to try    Standing Unsupported, One Foot in Front  Able to plae foot ahead of the other independently and hold 30 seconds    Standing on One Leg  Tries to lift leg/unable to hold 3 seconds but remains standing independently    Total Score  32      Timed Up and Go Test   Normal TUG (seconds)  41.5    TUG Comments  Scores >13.5 sec indicate increased fall risk, >30 seconds indicate difficulty with ADLs in home.                           PT Education - 11/02/17 [phone]    Education Details  mixed results of progress towards STGs (some areas improved, most did not); importance of continuing HEP with wife's assistance;     Person(s) Educated  Patient;Spouse    Methods  Explanation    Comprehension  Verbalized understanding       PT Short Term Goals - 11/02/17 0700      PT SHORT TERM GOAL #1   Title  Pt will perform HEP with wife's supervision for improved balance, strength and gait.  TARGET 11/02/17    Baseline  11/01/17 Patient has had very poor compliance despite wife's encouragement and trying to get him to do his HEP    Time  5    Period  Weeks    Status  Not Met      PT SHORT TERM GOAL #2   Title  Pt will improve Berg Balance score to at least 32/56 for decreased fall risk.    Baseline  32/56    Time  5    Period  Weeks    Status  Achieved      PT SHORT TERM GOAL #3   Title  Pt will improve TUG score to equal to or less than 28 seconds for decreased fall risk.    Baseline  9/19  41.5 sec     Time  5    Period  Weeks    Status   Not Met      PT SHORT TERM GOAL #4   Title  Pt will ambulate at least 300 ft using least restrictive assistive device (cane versus RW) modified independnetly for improved safety with gait.    Baseline  9/19 130 ft with SPC with supervision    Time  5    Period  Weeks    Status  Not Met      PT SHORT TERM GOAL #5   Title  Pt/wife will verbalize understanding of fall prevention in home environment.    Baseline  9/19 wife, yes    Time  5    Period  Weeks    Status  Achieved        PT Long Term Goals - 10/03/17 1944      PT LONG TERM GOAL #1   Title  Pt will improve gait velocity score to at least 1.8 ft/sec for decreased fall risk.  TARGET 11/30/17    Time  9    Period  Weeks    Status  New    Target Date  11/30/17      PT LONG TERM GOAL #2   Title  Pt will improve Berg score to at least 37/56 for decreased fall risk.    Time  9    Period  Weeks    Status  New    Target Date  11/30/17      PT LONG TERM GOAL #3   Title  Pt will improve TUG score to less than or equal to 20 seconds for decreased fall risk.    Time  9    Period  Weeks    Status  New    Target Date  11/30/17      PT LONG TERM GOAL #4   Title  Pt will verbalize plans for ongoing community fitness post d/c.    Time  9    Period  Weeks    Status  New    Target Date  11/30/17      *discharged after 4 weeks due to lack of progress and compliance, therefore 8 week goals will not be assessed      Plan - 11/02/17 4174    Clinical Impression Statement  STGs assessed with pt meeting 2 of 5 goals and 3 goals not met. Patient with increased time on TUG compared to at evaluation (i.e. time was worse), however improved score on Berg Balance Assessment. Patient has consistently refused to do his HEP despite wife's encouragement (although did do it yesterday, finally!). Patient is being discharged due to lack of progress and poor compliance with HEP. Emphasized reasons for him to continue HEP.     Rehab Potential   Good    Clinical Impairments Affecting Rehab Potential  Good family support    PT Frequency  2x / week    PT Duration  8 weeks    PT Treatment/Interventions  ADLs/Self Care Home Management;DME Instruction;Balance training;Therapeutic exercise;Therapeutic activities;Functional mobility training;Gait training;Neuromuscular re-education;Patient/family education    PT Home Exercise Plan  Continue balance exercises, gait training, trial RW    Consulted and Agree with Plan of Care  Patient;Family member/caregiver    Family Member Consulted  wife, Hinton Dyer       Patient will benefit from skilled therapeutic intervention in order to improve the following deficits and impairments:  Abnormal gait, Decreased balance, Difficulty walking, Decreased strength, Decreased mobility  Visit Diagnosis: Unsteadiness on feet  Abnormal posture  Other abnormalities of gait and mobility     Problem List Patient Active Problem List   Diagnosis Date Noted  . BPH (benign prostatic hypertrophy) with urinary retention 11/05/2015  . AKI (acute kidney injury) ([place]) 08/15/2015  . Acute renal failure ([place]) 08/15/2015  . Constipation 08/15/2015  . Urinary retention 08/15/2015  . Hyperkalemia 08/15/2015   PHYSICAL THERAPY DISCHARGE SUMMARY  Visits from Start of Care: 9  Current functional level related to goals / functional outcomes: See above results of STG   Remaining deficits: Posture, balance, safety with gait   Education / Equipment: HEP extensively educated in technique and importance, benefits  Plan: Patient agrees to discharge.  Patient goals were not met. Patient is being discharged due to the physician's request.  ?????        Rexanne Mano, PT 11/02/2017, 7:06 AM  [facility] [address] [place], [place], [address] Phone: [phone]   Fax:  [phone]  Name: [name] MRN: [id number] Date of Birth: [birth year]

## 2018-01-01 ENCOUNTER — Inpatient Hospital Stay (HOSPITAL_COMMUNITY)
Admission: EM | Admit: 2018-01-01 | Discharge: 2018-01-13 | DRG: 082 | Disposition: E | Payer: Medicare Other | Attending: Internal Medicine | Admitting: Internal Medicine

## 2018-01-01 ENCOUNTER — Other Ambulatory Visit: Payer: Self-pay

## 2018-01-01 ENCOUNTER — Encounter (HOSPITAL_COMMUNITY): Payer: Self-pay | Admitting: Emergency Medicine

## 2018-01-01 ENCOUNTER — Emergency Department (HOSPITAL_COMMUNITY): Payer: Medicare Other

## 2018-01-01 DIAGNOSIS — Z9842 Cataract extraction status, left eye: Secondary | ICD-10-CM

## 2018-01-01 DIAGNOSIS — Z974 Presence of external hearing-aid: Secondary | ICD-10-CM | POA: Diagnosis not present

## 2018-01-01 DIAGNOSIS — S065XAA Traumatic subdural hemorrhage with loss of consciousness status unknown, initial encounter: Secondary | ICD-10-CM | POA: Insufficient documentation

## 2018-01-01 DIAGNOSIS — Z66 Do not resuscitate: Secondary | ICD-10-CM | POA: Diagnosis present

## 2018-01-01 DIAGNOSIS — J810 Acute pulmonary edema: Secondary | ICD-10-CM | POA: Diagnosis present

## 2018-01-01 DIAGNOSIS — H547 Unspecified visual loss: Secondary | ICD-10-CM | POA: Diagnosis present

## 2018-01-01 DIAGNOSIS — I4891 Unspecified atrial fibrillation: Secondary | ICD-10-CM | POA: Diagnosis present

## 2018-01-01 DIAGNOSIS — I615 Nontraumatic intracerebral hemorrhage, intraventricular: Secondary | ICD-10-CM

## 2018-01-01 DIAGNOSIS — S066X9A Traumatic subarachnoid hemorrhage with loss of consciousness of unspecified duration, initial encounter: Secondary | ICD-10-CM | POA: Diagnosis present

## 2018-01-01 DIAGNOSIS — R338 Other retention of urine: Secondary | ICD-10-CM | POA: Diagnosis present

## 2018-01-01 DIAGNOSIS — Z79890 Hormone replacement therapy (postmenopausal): Secondary | ICD-10-CM | POA: Diagnosis not present

## 2018-01-01 DIAGNOSIS — E039 Hypothyroidism, unspecified: Secondary | ICD-10-CM | POA: Diagnosis present

## 2018-01-01 DIAGNOSIS — F0390 Unspecified dementia without behavioral disturbance: Secondary | ICD-10-CM | POA: Diagnosis present

## 2018-01-01 DIAGNOSIS — J9601 Acute respiratory failure with hypoxia: Secondary | ICD-10-CM | POA: Diagnosis present

## 2018-01-01 DIAGNOSIS — Z881 Allergy status to other antibiotic agents status: Secondary | ICD-10-CM | POA: Diagnosis not present

## 2018-01-01 DIAGNOSIS — Z7982 Long term (current) use of aspirin: Secondary | ICD-10-CM | POA: Diagnosis not present

## 2018-01-01 DIAGNOSIS — R58 Hemorrhage, not elsewhere classified: Secondary | ICD-10-CM | POA: Diagnosis not present

## 2018-01-01 DIAGNOSIS — M25551 Pain in right hip: Secondary | ICD-10-CM | POA: Diagnosis not present

## 2018-01-01 DIAGNOSIS — Z885 Allergy status to narcotic agent status: Secondary | ICD-10-CM | POA: Diagnosis not present

## 2018-01-01 DIAGNOSIS — Z515 Encounter for palliative care: Secondary | ICD-10-CM

## 2018-01-01 DIAGNOSIS — I6200 Nontraumatic subdural hemorrhage, unspecified: Secondary | ICD-10-CM | POA: Diagnosis not present

## 2018-01-01 DIAGNOSIS — N401 Enlarged prostate with lower urinary tract symptoms: Secondary | ICD-10-CM | POA: Diagnosis present

## 2018-01-01 DIAGNOSIS — Z887 Allergy status to serum and vaccine status: Secondary | ICD-10-CM

## 2018-01-01 DIAGNOSIS — Z79899 Other long term (current) drug therapy: Secondary | ICD-10-CM | POA: Diagnosis not present

## 2018-01-01 DIAGNOSIS — W19XXXA Unspecified fall, initial encounter: Secondary | ICD-10-CM | POA: Diagnosis present

## 2018-01-01 DIAGNOSIS — S066X0A Traumatic subarachnoid hemorrhage without loss of consciousness, initial encounter: Secondary | ICD-10-CM | POA: Diagnosis not present

## 2018-01-01 DIAGNOSIS — W19XXXD Unspecified fall, subsequent encounter: Secondary | ICD-10-CM | POA: Diagnosis not present

## 2018-01-01 DIAGNOSIS — I609 Nontraumatic subarachnoid hemorrhage, unspecified: Secondary | ICD-10-CM | POA: Diagnosis not present

## 2018-01-01 DIAGNOSIS — R4020 Unspecified coma: Secondary | ICD-10-CM | POA: Diagnosis not present

## 2018-01-01 DIAGNOSIS — R079 Chest pain, unspecified: Secondary | ICD-10-CM | POA: Diagnosis not present

## 2018-01-01 DIAGNOSIS — S299XXA Unspecified injury of thorax, initial encounter: Secondary | ICD-10-CM | POA: Diagnosis not present

## 2018-01-01 DIAGNOSIS — S0003XA Contusion of scalp, initial encounter: Secondary | ICD-10-CM | POA: Diagnosis not present

## 2018-01-01 DIAGNOSIS — W1830XA Fall on same level, unspecified, initial encounter: Secondary | ICD-10-CM | POA: Diagnosis present

## 2018-01-01 DIAGNOSIS — S065X9A Traumatic subdural hemorrhage with loss of consciousness of unspecified duration, initial encounter: Principal | ICD-10-CM

## 2018-01-01 DIAGNOSIS — G936 Cerebral edema: Secondary | ICD-10-CM | POA: Diagnosis present

## 2018-01-01 DIAGNOSIS — S065X0A Traumatic subdural hemorrhage without loss of consciousness, initial encounter: Secondary | ICD-10-CM | POA: Diagnosis not present

## 2018-01-01 DIAGNOSIS — Z7189 Other specified counseling: Secondary | ICD-10-CM | POA: Diagnosis not present

## 2018-01-01 DIAGNOSIS — Z9079 Acquired absence of other genital organ(s): Secondary | ICD-10-CM | POA: Diagnosis not present

## 2018-01-01 DIAGNOSIS — S199XXA Unspecified injury of neck, initial encounter: Secondary | ICD-10-CM | POA: Diagnosis not present

## 2018-01-01 DIAGNOSIS — S79911A Unspecified injury of right hip, initial encounter: Secondary | ICD-10-CM | POA: Diagnosis not present

## 2018-01-01 DIAGNOSIS — S06330A Contusion and laceration of cerebrum, unspecified, without loss of consciousness, initial encounter: Secondary | ICD-10-CM | POA: Diagnosis not present

## 2018-01-01 LAB — BASIC METABOLIC PANEL
Anion gap: 8 (ref 5–15)
BUN: 17 mg/dL (ref 8–23)
CALCIUM: 8.7 mg/dL — AB (ref 8.9–10.3)
CO2: 30 mmol/L (ref 22–32)
Chloride: 101 mmol/L (ref 98–111)
Creatinine, Ser: 0.69 mg/dL (ref 0.61–1.24)
GFR calc non Af Amer: >60 mL/min (ref >60)
GLUCOSE: 115 mg/dL — AB (ref 70–99)
Potassium: 3.8 mmol/L (ref 3.5–5.1)
SODIUM: 139 mmol/L (ref 135–145)

## 2018-01-01 LAB — PROTIME-INR
INR: 1.04
Prothrombin Time: 13.5 seconds (ref 11.4–15.2)

## 2018-01-01 LAB — CBC
HCT: 40.5 % (ref 39.0–52.0)
Hemoglobin: 13.1 g/dL (ref 13.0–17.0)
MCH: 33.6 pg (ref 26.0–34.0)
MCHC: 32.3 g/dL (ref 30.0–36.0)
MCV: 103.8 fL — ABNORMAL HIGH (ref 80.0–100.0)
PLATELETS: 203 10*3/uL (ref 150–400)
RBC: 3.9 MIL/uL — AB (ref 4.22–5.81)
RDW: 13.9 % (ref 11.5–15.5)
WBC: 6.9 10*3/uL (ref 4.0–10.5)
nRBC: 0 % (ref 0.0–0.2)

## 2018-01-01 MED ORDER — FENTANYL CITRATE (PF) 100 MCG/2ML IJ SOLN
50.0000 ug | Freq: Once | INTRAMUSCULAR | Status: AC
  Administered 2018-01-01: 50 ug via INTRAVENOUS
  Filled 2018-01-01: qty 2

## 2018-01-01 MED ORDER — FUROSEMIDE 10 MG/ML IJ SOLN
20.0000 mg | Freq: Once | INTRAMUSCULAR | Status: AC
  Administered 2018-01-01: 20 mg via INTRAVENOUS
  Filled 2018-01-01: qty 2

## 2018-01-01 MED ORDER — HYDROCODONE-ACETAMINOPHEN 10-325 MG PO TABS: 1.0000 | ORAL_TABLET | Freq: Four times a day (QID) | ORAL | Status: DC | PRN

## 2018-01-01 MED ORDER — LEVETIRACETAM IN NACL 500 MG/100ML IV SOLN
500.0000 mg | Freq: Two times a day (BID) | INTRAVENOUS | Status: DC
  Administered 2018-01-01 – 2018-01-05 (×7): 500 mg via INTRAVENOUS
  Filled 2018-01-01 (×8): qty 100

## 2018-01-01 MED ORDER — LATANOPROST 0.005 % OP SOLN
1.0000 [drp] | Freq: Every day | OPHTHALMIC | Status: DC
  Administered 2018-01-04 (×2): 1 [drp] via OPHTHALMIC
  Filled 2018-01-01 (×3): qty 2.5

## 2018-01-01 MED ORDER — LEVOTHYROXINE SODIUM 25 MCG PO TABS: 25.0000 ug | ORAL_TABLET | Freq: Every day | ORAL | Status: DC

## 2018-01-01 MED ORDER — LEVOTHYROXINE SODIUM 100 MCG IV SOLR
12.5000 ug | Freq: Every day | INTRAVENOUS | Status: DC
  Administered 2018-01-02: 12.5 ug via INTRAVENOUS
  Filled 2018-01-01: qty 5

## 2018-01-01 MED ORDER — DILTIAZEM HCL-DEXTROSE 100-5 MG/100ML-% IV SOLN (PREMIX)
5.0000 mg/h | INTRAVENOUS | Status: DC
  Administered 2018-01-01: 5 mg/h via INTRAVENOUS
  Filled 2018-01-01: qty 100

## 2018-01-01 MED ORDER — ACETAMINOPHEN 650 MG RE SUPP
650.0000 mg | Freq: Four times a day (QID) | RECTAL | Status: DC | PRN
  Filled 2018-01-01: qty 1

## 2018-01-01 MED ORDER — DILTIAZEM LOAD VIA INFUSION
15.0000 mg | Freq: Once | INTRAVENOUS | Status: AC
  Administered 2018-01-01: 15 mg via INTRAVENOUS
  Filled 2018-01-01 (×3): qty 15

## 2018-01-01 MED ORDER — MORPHINE SULFATE (PF) 2 MG/ML IV SOLN
2.0000 mg | Freq: Once | INTRAVENOUS | Status: AC
  Administered 2018-01-01: 2 mg via INTRAVENOUS
  Filled 2018-01-01: qty 1

## 2018-01-01 MED ORDER — FENTANYL CITRATE (PF) 100 MCG/2ML IJ SOLN: 25.0000 ug | INTRAMUSCULAR | Status: DC | PRN

## 2018-01-01 MED ORDER — ACETAMINOPHEN 325 MG PO TABS: 650.0000 mg | ORAL_TABLET | Freq: Four times a day (QID) | ORAL | Status: DC | PRN

## 2018-01-01 MED ORDER — POLYETHYLENE GLYCOL 3350 17 G PO PACK: 17.0000 g | PACK | Freq: Every day | ORAL | Status: DC | PRN

## 2018-01-01 NOTE — Progress Notes (Signed)
Recurved call from nursing that patient is becoming more restless. Still moving all extremities.  No change in NS plan. Continue to monitor.

## 2018-01-01 NOTE — ED Notes (Signed)
Unable to obtain all of the triage questions due to patients mental status, no family member are present at bedside.

## 2018-01-01 NOTE — ED Notes (Addendum)
Vinny PA- Neurosurgery made aware of pts change in condition. Per Neurosurgery- no additional interventions needed at this time.

## 2018-01-01 NOTE — ED Notes (Signed)
[facility] Notified need for transport 

## 2018-01-01 NOTE — ED Provider Notes (Signed)
[facility] DEPT Provider Note   CSN: [id number] Arrival date & time: 01/10/2018  1413     History   Chief Complaint Chief Complaint  Patient presents with  . Fall   Level 5 caveat: Closed head injury/repetitive speech  HPI [name] is a 84 y.o. male.  HPI Patient is an 84 year old male presents emergency department after mechanical fall today.  He lives at home with his wife.  He walks with a cane.  They had just parked the car at their house and gotten out of the car.  When the wife got out of the car she found her husband on the ground a few feet from the car.  He presents with trauma to his right forehead and right parietal scalp and complains of headache and right hip pain.  Denies significant neck pain.  Moves all 4 extremities.  Patient is on aspirin.  No use of any other anticoagulants.  Wife reports he has been his normal state of health this week.   Past Medical History:  Diagnosis Date  . Arthritis    back "issues"  . Enlarged prostate   . Foley catheter in place    remains in place to leg bag.  Marland Kitchen Hearing difficulty of both ears    hearing aids-sometimes  . Hypothyroidism    recently diagnosed  . Neuromuscular disorder ([place])    periodic sciatic nerve pain left side.  . Renal failure (ARF), acute on chronic [facility])    7'17 episode of acute renal failure with hospital visit after Kyphoplasty procedure, urinary retention found, and Foley catheter placed to relieve.  . Sight impaired    left eye "detached retina"- limited vision left eye.    Patient Active Problem List   Diagnosis Date Noted  . BPH (benign prostatic hypertrophy) with urinary retention 11/05/2015  . AKI (acute kidney injury) ([place]) 08/15/2015  . Acute renal failure ([place]) 08/15/2015  . Constipation 08/15/2015  . Urinary retention 08/15/2015  . Hyperkalemia 08/15/2015    Past Surgical History:  Procedure Laterality Date  . ABDOMINAL HERNIA REPAIR    . BACK  SURGERY     herniated disc  . CARPAL TUNNEL RELEASE Bilateral   . CATARACT EXTRACTION    . EYE SURGERY Left    x2 left eye detached retina-continues to be treated" some vision loss"  . HERNIA REPAIR Right    right groin hernia repair  . INSERTION OF SUPRAPUBIC CATHETER N/A 11/05/2015   Procedure: INSERTION OF SUPRAPUBIC CATHETER;  Surgeon: Kathie Rhodes, MD;  Location: [facility] ORS;  Service: Urology;  Laterality: N/A;  . KNEE ARTHROSCOPY WITH EXCISION BAKER'S CYST    . KYPHOPLASTY     08-03-15   . SEPTOPLASTY    . TESTICLE REMOVAL Left    ? why  . TRANSURETHRAL RESECTION OF PROSTATE N/A 11/05/2015   Procedure: TRANSURETHRAL RESECTION OF THE PROSTATE (TURP) WITH GYRUS;  Surgeon: Kathie Rhodes, MD;  Location: [facility] ORS;  Service: Urology;  Laterality: N/A;  . VASECTOMY          Home Medications    Prior to Admission medications   Medication Sig Start Date End Date Taking? Authorizing Provider  acetaminophen (TYLENOL) 500 MG tablet Take 500 mg by mouth every 6 (six) hours as needed for moderate pain.   Yes [provider]  aspirin EC 81 MG tablet Take 81 mg by mouth daily.   Yes [provider]  feeding supplement, ENSURE ENLIVE, (ENSURE ENLIVE) LIQD Take  237 mLs by mouth daily at 3 pm. Patient taking differently: Take 237 mLs by mouth daily.  08/19/15  Yes Robbie Lis, MD  levothyroxine (SYNTHROID, LEVOTHROID) 25 MCG tablet Take 25 mcg by mouth daily before breakfast. 10/08/15  Yes [provider]  Multiple Vitamin (MULTIVITAMIN WITH MINERALS) TABS tablet Take 1 tablet by mouth daily.   Yes [provider]  TRAVATAN Z 0.004 % SOLN ophthalmic solution Place 1 drop into both eyes at bedtime. 05/05/15  Yes [provider]  acetaminophen (TYLENOL) 325 MG tablet Take 2 tablets (650 mg total) by mouth every 6 (six) hours as needed for mild pain (or Fever >/= 101). Patient not taking: Reported on 01/08/2018 08/19/15   Robbie Lis, MD    HYDROcodone-acetaminophen [facility]) 10-325 MG tablet Take 1 tablet by mouth every 6 (six) hours as needed for moderate pain.  09/25/17   [provider]  HYDROcodone-acetaminophen (NORCO) 7.5-325 MG tablet Take 1-2 tablets by mouth every 4 (four) hours as needed for moderate pain. Maximum dose per 24 hours - 8 pills Patient not taking: Reported on 12/20/2017 11/05/15   Kathie Rhodes, MD  polyethylene glycol (MIRALAX / GLYCOLAX) packet Take 17 g by mouth daily as needed. For constipation.    [provider]  senna (SENOKOT) 8.6 MG tablet Take 1-2 tablets by mouth daily as needed for constipation.    [provider]  trimethoprim (TRIMPEX) 100 MG tablet Take 1 tablet (100 mg total) by mouth 2 (two) times daily. Patient not taking: Reported on 10/02/2017 11/05/15   Kathie Rhodes, MD    Family History Family History  Problem Relation Age of Onset  . Heart attack Unknown     Social History Social History   Tobacco Use  . Smoking status: Never Smoker  . Smokeless tobacco: Never Used  Substance Use Topics  . Alcohol use: No  . Drug use: No     Allergies   Ciprofloxacin; Sulfa antibiotics; and Tramadol   Review of Systems Review of Systems  Unable to perform ROS: Mental status change     Physical Exam Updated Vital Signs BP (!) 144/85   Pulse 97   Temp 97.9 F (36.6 C) (Oral)   Resp 19   SpO2 97%   Physical Exam  Constitutional: He is oriented to person, place, and time. He appears well-developed and well-nourished.  HENT:  Head: Normocephalic.  Right parietal and right frontal scalp bruising and small hematoma.  Eyes: EOM are normal.  Neck: Neck supple.  Mild cervical and paracervical tenderness without cervical step-off.  C-spine immobilized in cervical collar.  Cardiovascular: Normal rate, regular rhythm and normal heart sounds.  Pulmonary/Chest: Effort normal and breath sounds normal. No respiratory distress.  Abdominal: Soft. He exhibits no  distension. There is no tenderness.  Musculoskeletal: Normal range of motion.  Mild pain with range of motion of the right hip without obvious deformity of the right lower extremity.  No shortening or external rotation.  Full range of motion of bilateral shoulders, elbows, wrist.  Small skin tear to the posterior right elbow without active bleeding.  Full range of motion of left hip, knee, ankle.  Neurological: He is alert and oriented to person, place, and time.  Skin: Skin is warm and dry.  Psychiatric: He has a normal mood and affect. Judgment normal.  Nursing note and vitals reviewed.    ED Treatments / Results  Labs (all labs ordered are listed, but only abnormal results are displayed) Labs  Reviewed  CBC  BASIC METABOLIC PANEL  PROTIME-INR    EKG None  Radiology Dg Chest 1 View  Result Date: 12/22/2017 CLINICAL DATA:  Fall with pain EXAM: CHEST  1 VIEW COMPARISON:  08/29/2017 FINDINGS: Chronic cardiomegaly.  Aortic tortuosity. Large lung volumes and interstitial coarsening. No consolidation, effusion, or pneumothorax. No acute osseous finding as permitted by osteopenia and rotation. IMPRESSION: Cardiomegaly and borderline vascular congestion. Electronically Signed   By: Monte Fantasia M.D.   On: 12/29/2017 15:25   Ct Head Wo Contrast  Result Date: 12/29/2017 CLINICAL DATA:  84 year old male post fall.  Initial encounter. EXAM: CT HEAD WITHOUT CONTRAST CT CERVICAL SPINE WITHOUT CONTRAST TECHNIQUE: Multidetector CT imaging of the head and cervical spine was performed following the standard protocol without intravenous contrast. Multiplanar CT image reconstructions of the cervical spine were also generated. COMPARISON:  08/21/2015. FINDINGS: CT HEAD FINDINGS Brain: Left occipital lobe hematoma spanning over 5.6 x 2.4 x 3.8 cm with surrounding vasogenic edema. Broad-based left convexity subdural hematoma measuring up to 4 mm maximal thickness. Small amount of subarachnoid blood left  temporal region and occipital region. Tiny posterior left parafalcine blood may be subarachnoid or subdural in location. Question artifact versus tiny amount of blood along the periphery of the right temporal lobe. Chronic microvascular changes. Global atrophy. No intracranial mass lesion noted on this unenhanced exam. Vascular: Vascular calcifications Skull: No skull fracture. Left nasal bone fracture of questionable age. Sinuses/Orbits: Post lens replacement. Globes appear to be grossly intact. Mucosal thickening/opacification left frontal sinus without surrounding fracture. Opacification ethmoid sinus air cells bilaterally. Polypoid opacification anterior sphenoid sinus. Other: Mastoid air cells and middle ear cavities are clear. CT CERVICAL SPINE FINDINGS Alignment: Curvature cervical spine. Skull base and vertebrae: No cervical spine fracture noted. Minimal interval loss of height T1 vertebral body predominantly involving the inferior endplate suggestive of small Schmorl's node deformity of indeterminate age. Soft tissues and spinal canal: No abnormal prevertebral soft tissue swelling. Disc levels: Multilevel cervical spondylotic changes with various degrees of spinal stenosis and foraminal narrowing. Upper chest: Bony overgrowth sternoclavicular region incompletely assessed. This was noted previously. Other: No worrisome neck mass. IMPRESSION: CT HEAD: 1. Left occipital lobe hematoma spanning over 5.6 x 2.4 x 3.8 cm with surrounding vasogenic edema. 2. Broad-based left convexity subdural hematoma measuring up to 4 mm maximal thickness. 3. Small amount of subarachnoid blood left temporal region and occipital region. 4. Tiny posterior left parafalcine blood may be subarachnoid or subdural in location. 5. Question artifact versus tiny amount of blood along the periphery of the right temporal lobe. 6. Chronic microvascular changes. Global atrophy. 7. Left nasal bone fracture of questionable age. 8. Mucosal  thickening/opacification left frontal sinus without surrounding fracture. Opacification ethmoid sinus air cells bilaterally. Polypoid opacification anterior sphenoid sinus. CT CERVICAL SPINE: 1. Curvature cervical spine. No cervical spine fracture or abnormal prevertebral soft tissue swelling noted. 2. Minimal interval loss of height T1 vertebral body predominantly involving the inferior endplate suggestive of small Schmorl's node deformity of indeterminate age. 3. Multilevel cervical spondylotic changes with various degrees of spinal stenosis and foraminal narrowing. These results were called by telephone at the time of interpretation on 12/20/2017 at 3:14 pm to Dr. Jola Schmidt , who verbally acknowledged these results. Electronically Signed   By: Genia Del M.D.   On: 01/08/2018 15:41   Ct Cervical Spine Wo Contrast  Result Date: 12/20/2017 CLINICAL DATA:  84 year old male post fall.  Initial encounter. EXAM: CT HEAD WITHOUT CONTRAST CT  CERVICAL SPINE WITHOUT CONTRAST TECHNIQUE: Multidetector CT imaging of the head and cervical spine was performed following the standard protocol without intravenous contrast. Multiplanar CT image reconstructions of the cervical spine were also generated. COMPARISON:  08/21/2015. FINDINGS: CT HEAD FINDINGS Brain: Left occipital lobe hematoma spanning over 5.6 x 2.4 x 3.8 cm with surrounding vasogenic edema. Broad-based left convexity subdural hematoma measuring up to 4 mm maximal thickness. Small amount of subarachnoid blood left temporal region and occipital region. Tiny posterior left parafalcine blood may be subarachnoid or subdural in location. Question artifact versus tiny amount of blood along the periphery of the right temporal lobe. Chronic microvascular changes. Global atrophy. No intracranial mass lesion noted on this unenhanced exam. Vascular: Vascular calcifications Skull: No skull fracture. Left nasal bone fracture of questionable age. Sinuses/Orbits: Post lens  replacement. Globes appear to be grossly intact. Mucosal thickening/opacification left frontal sinus without surrounding fracture. Opacification ethmoid sinus air cells bilaterally. Polypoid opacification anterior sphenoid sinus. Other: Mastoid air cells and middle ear cavities are clear. CT CERVICAL SPINE FINDINGS Alignment: Curvature cervical spine. Skull base and vertebrae: No cervical spine fracture noted. Minimal interval loss of height T1 vertebral body predominantly involving the inferior endplate suggestive of small Schmorl's node deformity of indeterminate age. Soft tissues and spinal canal: No abnormal prevertebral soft tissue swelling. Disc levels: Multilevel cervical spondylotic changes with various degrees of spinal stenosis and foraminal narrowing. Upper chest: Bony overgrowth sternoclavicular region incompletely assessed. This was noted previously. Other: No worrisome neck mass. IMPRESSION: CT HEAD: 1. Left occipital lobe hematoma spanning over 5.6 x 2.4 x 3.8 cm with surrounding vasogenic edema. 2. Broad-based left convexity subdural hematoma measuring up to 4 mm maximal thickness. 3. Small amount of subarachnoid blood left temporal region and occipital region. 4. Tiny posterior left parafalcine blood may be subarachnoid or subdural in location. 5. Question artifact versus tiny amount of blood along the periphery of the right temporal lobe. 6. Chronic microvascular changes. Global atrophy. 7. Left nasal bone fracture of questionable age. 8. Mucosal thickening/opacification left frontal sinus without surrounding fracture. Opacification ethmoid sinus air cells bilaterally. Polypoid opacification anterior sphenoid sinus. CT CERVICAL SPINE: 1. Curvature cervical spine. No cervical spine fracture or abnormal prevertebral soft tissue swelling noted. 2. Minimal interval loss of height T1 vertebral body predominantly involving the inferior endplate suggestive of small Schmorl's node deformity of  indeterminate age. 3. Multilevel cervical spondylotic changes with various degrees of spinal stenosis and foraminal narrowing. These results were called by telephone at the time of interpretation on 01/07/2018 at 3:14 pm to Dr. Jola Schmidt , who verbally acknowledged these results. Electronically Signed   By: Genia Del M.D.   On: 12/31/2017 15:41   Dg Hip Unilat W Or Wo Pelvis 2-3 Views Right  Result Date: 01/10/2018 CLINICAL DATA:  Right hip pain after fall. EXAM: DG HIP (WITH OR WITHOUT PELVIS) 2-3V RIGHT COMPARISON:  None. FINDINGS: There is no evidence of hip fracture or dislocation. There is no evidence of arthropathy or other focal bone abnormality. IMPRESSION: Negative. Electronically Signed   By: Marijo Conception, M.D.   On: 01/09/2018 15:26   Case discussed with radiology regarding head CT with left occipital lobe hematoma as well as small subdural and small subarachnoid on the left.  Procedures .Critical Care Performed by: Jola Schmidt, MD Authorized by: Jola Schmidt, MD     CRITICAL CARE Performed by: Jola Schmidt Total critical care time: 35 minutes Critical care time was exclusive of separately billable procedures  and treating other patients. Critical care was necessary to treat or prevent imminent or life-threatening deterioration. Critical care was time spent personally by me on the following activities: development of treatment plan with patient and/or surrogate as well as nursing, discussions with consultants, evaluation of patient's response to treatment, examination of patient, obtaining history from patient or surrogate, ordering and performing treatments and interventions, ordering and review of laboratory studies, ordering and review of radiographic studies, pulse oximetry and re-evaluation of patient's condition.   Medications Ordered in ED Medications  fentaNYL (SUBLIMAZE) injection 50 mcg (50 mcg Intravenous Given 12/23/2017 1531)     Initial Impression /  Assessment and Plan / ED Course  I have reviewed the triage vital signs and the nursing notes.  Pertinent labs & imaging results that were available during my care of the patient were reviewed by me and considered in my medical decision making (see chart for details).     Mechanical fall. Complains of HA and right hip pain.   3:44 PM Spoke with [facility] ([place], PA-C), they have reviewed the images.  No need for intervention at this time but they will see in consultation and follow along.   Spoke with hospitalist, Dr [place] Pink. Will see in consultation and admit to step down unit at [facility].   Final Clinical Impressions(s) / ED Diagnoses   Final diagnoses:  Subdural hematoma ([place])  SAH (subarachnoid hemorrhage) [facility])    ED Discharge Orders    None       Jola Schmidt, MD 12/29/2017 1553

## 2018-01-01 NOTE — ED Notes (Signed)
Bed: [facility] Expected date:  Expected time:  Means of arrival:  Comments: EMS-fall 

## 2018-01-01 NOTE — ED Notes (Signed)
Hospitialist at bedside.  

## 2018-01-01 NOTE — Consult Note (Addendum)
0.004 % SOLN ophthalmic solution Place 1 drop into both eyes at bedtime.   12/31/2017 at Unknown time  . HYDROcodone-acetaminophen (NORCO) 10-325 MG tablet Take 1 tablet by mouth every 6 (six) hours as needed for moderate pain.   0 unknown  . polyethylene glycol (MIRALAX / GLYCOLAX) packet Take 17 g by mouth daily as needed. For constipation.   unknown  . senna (SENOKOT) 8.6 MG tablet Take 1-2 tablets by mouth daily as needed for constipation.   unknown    SH: Social History   Tobacco Use  . Smoking status: Never Smoker  . Smokeless tobacco: Never Used  Substance Use Topics  . Alcohol use: No  . Drug use: No    MEDS: Prior to Admission medications     Medication Sig Start Date End Date Taking? Authorizing Provider  acetaminophen (TYLENOL) 500 MG tablet Take 500 mg by mouth every 6 (six) hours as needed for moderate pain.   Yes [provider]  aspirin EC 81 MG tablet Take 81 mg by mouth daily.   Yes [provider]  feeding supplement, ENSURE ENLIVE, (ENSURE ENLIVE) LIQD Take 237 mLs by mouth daily at 3 pm. Patient taking differently: Take 237 mLs by mouth daily.  08/19/15  Yes Robbie Lis, MD  levothyroxine (SYNTHROID, LEVOTHROID) 25 MCG tablet Take 25 mcg by mouth daily before breakfast. 10/08/15  Yes [provider]  Multiple Vitamin (MULTIVITAMIN WITH MINERALS) TABS tablet Take 1 tablet by mouth daily.   Yes [provider]  TRAVATAN Z 0.004 % SOLN ophthalmic solution Place 1 drop into both eyes at bedtime. 05/05/15  Yes [provider]  HYDROcodone-acetaminophen (NORCO) 10-325 MG tablet Take 1 tablet by mouth every 6 (six) hours as needed for moderate pain.  09/25/17   [provider]  polyethylene glycol (MIRALAX / GLYCOLAX) packet Take 17 g by mouth daily as needed. For constipation.    [provider]  senna (SENOKOT) 8.6 MG tablet Take 1-2 tablets by mouth daily as needed for constipation.    [provider]    ALLERGY: Allergies  Allergen Reactions  . Ciprofloxacin Hives  . Sulfa Antibiotics Hives  . Tramadol Other (See Comments)    Altered Mental status    Social History   Tobacco Use  . Smoking status: Never Smoker  . Smokeless tobacco: Never Used  Substance Use Topics  . Alcohol use: No     Family History  Problem Relation Age of Onset  . Heart attack Unknown      ROS   ROS unable to obtain, AMS  Exam   Vitals:   12/20/2017 1932 01/11/2018 1948  BP: (!) 146/85   Pulse: 63   Resp: (!) 26   Temp:    SpO2: 92% 91%   Cachetic elderly male PERRL Right facial droop Does not follow commands Opens eyes occasionally to pain Moves LUE  spontaneously Localizes with LUE W/D LLE and RLE to painful stimulus, L>R  Results - Imaging/Labs   Results for orders placed or performed during the hospital encounter of 12/19/2017 (from the past 48 hour(s))  CBC     Status: Abnormal   Collection Time: 01/12/2018  3:30 PM  Result Value Ref Range   WBC 6.9 4.0 - 10.5 K/uL   RBC 3.90 (L) 4.22 - 5.81 MIL/uL   Hemoglobin 13.1 13.0 - 17.0 g/dL   HCT 40.5 39.0 - 52.0 %   MCV 103.8 (H) 80.0 - 100.0 fL   MCH 33.6  0.004 % SOLN ophthalmic solution Place 1 drop into both eyes at bedtime.   12/31/2017 at Unknown time  . HYDROcodone-acetaminophen (NORCO) 10-325 MG tablet Take 1 tablet by mouth every 6 (six) hours as needed for moderate pain.   0 unknown  . polyethylene glycol (MIRALAX / GLYCOLAX) packet Take 17 g by mouth daily as needed. For constipation.   unknown  . senna (SENOKOT) 8.6 MG tablet Take 1-2 tablets by mouth daily as needed for constipation.   unknown    SH: Social History   Tobacco Use  . Smoking status: Never Smoker  . Smokeless tobacco: Never Used  Substance Use Topics  . Alcohol use: No  . Drug use: No    MEDS: Prior to Admission medications     Medication Sig Start Date End Date Taking? Authorizing Provider  acetaminophen (TYLENOL) 500 MG tablet Take 500 mg by mouth every 6 (six) hours as needed for moderate pain.   Yes [provider]  aspirin EC 81 MG tablet Take 81 mg by mouth daily.   Yes [provider]  feeding supplement, ENSURE ENLIVE, (ENSURE ENLIVE) LIQD Take 237 mLs by mouth daily at 3 pm. Patient taking differently: Take 237 mLs by mouth daily.  08/19/15  Yes Robbie Lis, MD  levothyroxine (SYNTHROID, LEVOTHROID) 25 MCG tablet Take 25 mcg by mouth daily before breakfast. 10/08/15  Yes [provider]  Multiple Vitamin (MULTIVITAMIN WITH MINERALS) TABS tablet Take 1 tablet by mouth daily.   Yes [provider]  TRAVATAN Z 0.004 % SOLN ophthalmic solution Place 1 drop into both eyes at bedtime. 05/05/15  Yes [provider]  HYDROcodone-acetaminophen (NORCO) 10-325 MG tablet Take 1 tablet by mouth every 6 (six) hours as needed for moderate pain.  09/25/17   [provider]  polyethylene glycol (MIRALAX / GLYCOLAX) packet Take 17 g by mouth daily as needed. For constipation.    [provider]  senna (SENOKOT) 8.6 MG tablet Take 1-2 tablets by mouth daily as needed for constipation.    [provider]    ALLERGY: Allergies  Allergen Reactions  . Ciprofloxacin Hives  . Sulfa Antibiotics Hives  . Tramadol Other (See Comments)    Altered Mental status    Social History   Tobacco Use  . Smoking status: Never Smoker  . Smokeless tobacco: Never Used  Substance Use Topics  . Alcohol use: No     Family History  Problem Relation Age of Onset  . Heart attack Unknown      ROS   ROS unable to obtain, AMS  Exam   Vitals:   12/20/2017 1932 01/11/2018 1948  BP: (!) 146/85   Pulse: 63   Resp: (!) 26   Temp:    SpO2: 92% 91%   Cachetic elderly male PERRL Right facial droop Does not follow commands Opens eyes occasionally to pain Moves LUE  spontaneously Localizes with LUE W/D LLE and RLE to painful stimulus, L>R  Results - Imaging/Labs   Results for orders placed or performed during the hospital encounter of 12/19/2017 (from the past 48 hour(s))  CBC     Status: Abnormal   Collection Time: 01/12/2018  3:30 PM  Result Value Ref Range   WBC 6.9 4.0 - 10.5 K/uL   RBC 3.90 (L) 4.22 - 5.81 MIL/uL   Hemoglobin 13.1 13.0 - 17.0 g/dL   HCT 40.5 39.0 - 52.0 %   MCV 103.8 (H) 80.0 - 100.0 fL   MCH 33.6  Chief Complaint   Chief Complaint  Patient presents with  . Fall    HPI   Consult requested by: Dr Venora Maples Reason for consult: SDH  HPI: [name] is a 84 y.o. male who presented to [facility] ER earlier today after a witnessed fall by his wife. He was transferred to [facility] for monitoring. No family at bedside currently. History obtained via chart review. While in ER, became tachycardic and then went into Afib. Also became hypoxic requiring nasal canula. By report has advanced dementia but lives at home with wife.  Patient Active Problem List   Diagnosis Date Noted  . SDH (subdural hematoma) ([place]) 01/08/2018  . Hypothyroidism 12/16/2017  . Fall 12/31/2017  . New onset a-fib ([place]) 12/24/2017  . Acute respiratory failure with hypoxia ([place]) 12/29/2017  . Subdural hematoma ([place]) 01/07/2018  . Benign prostatic hyperplasia with urinary retention 11/05/2015  . Constipation 08/15/2015  . Urinary retention 08/15/2015    PMH: Past Medical History:  Diagnosis Date  . Arthritis    back "issues"  . Enlarged prostate   . Foley catheter in place    remains in place to leg bag.  Marland Kitchen Hearing difficulty of both ears    hearing aids-sometimes  . Hypothyroidism    recently diagnosed  . Neuromuscular disorder ([place])    periodic sciatic nerve pain left side.  . Renal failure (ARF), acute on chronic [facility])    7'17 episode of acute renal failure with hospital visit after Kyphoplasty procedure, urinary retention found, and Foley catheter placed to relieve.  . Sight impaired    left eye "detached retina"- limited vision left eye.    PSH: Past Surgical History:  Procedure Laterality Date  . ABDOMINAL HERNIA REPAIR    . BACK SURGERY     herniated disc  . CARPAL TUNNEL RELEASE Bilateral   . CATARACT EXTRACTION    . EYE SURGERY Left    x2 left eye detached retina-continues to be treated" some vision loss"  . HERNIA REPAIR Right    right groin hernia repair  . INSERTION OF SUPRAPUBIC CATHETER  N/A 11/05/2015   Procedure: INSERTION OF SUPRAPUBIC CATHETER;  Surgeon: Kathie Rhodes, MD;  Location: [facility];  Service: Urology;  Laterality: N/A;  . KNEE ARTHROSCOPY WITH EXCISION BAKER'S CYST    . KYPHOPLASTY     08-03-15   . SEPTOPLASTY    . TESTICLE REMOVAL Left    ? why  . TRANSURETHRAL RESECTION OF PROSTATE N/A 11/05/2015   Procedure: TRANSURETHRAL RESECTION OF THE PROSTATE (TURP) WITH GYRUS;  Surgeon: Kathie Rhodes, MD;  Location: [facility];  Service: Urology;  Laterality: N/A;  . VASECTOMY      Medications Prior to Admission  Medication Sig Dispense Refill Last Dose  . acetaminophen (TYLENOL) 500 MG tablet Take 500 mg by mouth every 6 (six) hours as needed for moderate pain.   12/31/2017 at Unknown time  . aspirin EC 81 MG tablet Take 81 mg by mouth daily.   12/31/2017 at Unknown time  . feeding supplement, ENSURE ENLIVE, (ENSURE ENLIVE) LIQD Take 237 mLs by mouth daily at 3 pm. (Patient taking differently: Take 237 mLs by mouth daily. ) 237 mL 12 12/28/2017 at Unknown time  . levothyroxine (SYNTHROID, LEVOTHROID) 25 MCG tablet Take 25 mcg by mouth daily before breakfast.   12/14/2017 at Unknown time  . Multiple Vitamin (MULTIVITAMIN WITH MINERALS) TABS tablet Take 1 tablet by mouth daily.   12/31/2017 at Unknown time  . TRAVATAN Z  Chief Complaint   Chief Complaint  Patient presents with  . Fall    HPI   Consult requested by: Dr Venora Maples Reason for consult: SDH  HPI: [name] is a 84 y.o. male who presented to [facility] ER earlier today after a witnessed fall by his wife. He was transferred to [facility] for monitoring. No family at bedside currently. History obtained via chart review. While in ER, became tachycardic and then went into Afib. Also became hypoxic requiring nasal canula. By report has advanced dementia but lives at home with wife.  Patient Active Problem List   Diagnosis Date Noted  . SDH (subdural hematoma) ([place]) 01/08/2018  . Hypothyroidism 12/16/2017  . Fall 12/31/2017  . New onset a-fib ([place]) 12/24/2017  . Acute respiratory failure with hypoxia ([place]) 12/29/2017  . Subdural hematoma ([place]) 01/07/2018  . Benign prostatic hyperplasia with urinary retention 11/05/2015  . Constipation 08/15/2015  . Urinary retention 08/15/2015    PMH: Past Medical History:  Diagnosis Date  . Arthritis    back "issues"  . Enlarged prostate   . Foley catheter in place    remains in place to leg bag.  Marland Kitchen Hearing difficulty of both ears    hearing aids-sometimes  . Hypothyroidism    recently diagnosed  . Neuromuscular disorder ([place])    periodic sciatic nerve pain left side.  . Renal failure (ARF), acute on chronic [facility])    7'17 episode of acute renal failure with hospital visit after Kyphoplasty procedure, urinary retention found, and Foley catheter placed to relieve.  . Sight impaired    left eye "detached retina"- limited vision left eye.    PSH: Past Surgical History:  Procedure Laterality Date  . ABDOMINAL HERNIA REPAIR    . BACK SURGERY     herniated disc  . CARPAL TUNNEL RELEASE Bilateral   . CATARACT EXTRACTION    . EYE SURGERY Left    x2 left eye detached retina-continues to be treated" some vision loss"  . HERNIA REPAIR Right    right groin hernia repair  . INSERTION OF SUPRAPUBIC CATHETER  N/A 11/05/2015   Procedure: INSERTION OF SUPRAPUBIC CATHETER;  Surgeon: Kathie Rhodes, MD;  Location: [facility];  Service: Urology;  Laterality: N/A;  . KNEE ARTHROSCOPY WITH EXCISION BAKER'S CYST    . KYPHOPLASTY     08-03-15   . SEPTOPLASTY    . TESTICLE REMOVAL Left    ? why  . TRANSURETHRAL RESECTION OF PROSTATE N/A 11/05/2015   Procedure: TRANSURETHRAL RESECTION OF THE PROSTATE (TURP) WITH GYRUS;  Surgeon: Kathie Rhodes, MD;  Location: [facility];  Service: Urology;  Laterality: N/A;  . VASECTOMY      Medications Prior to Admission  Medication Sig Dispense Refill Last Dose  . acetaminophen (TYLENOL) 500 MG tablet Take 500 mg by mouth every 6 (six) hours as needed for moderate pain.   12/31/2017 at Unknown time  . aspirin EC 81 MG tablet Take 81 mg by mouth daily.   12/31/2017 at Unknown time  . feeding supplement, ENSURE ENLIVE, (ENSURE ENLIVE) LIQD Take 237 mLs by mouth daily at 3 pm. (Patient taking differently: Take 237 mLs by mouth daily. ) 237 mL 12 12/28/2017 at Unknown time  . levothyroxine (SYNTHROID, LEVOTHROID) 25 MCG tablet Take 25 mcg by mouth daily before breakfast.   12/14/2017 at Unknown time  . Multiple Vitamin (MULTIVITAMIN WITH MINERALS) TABS tablet Take 1 tablet by mouth daily.   12/31/2017 at Unknown time  . TRAVATAN Z  0.004 % SOLN ophthalmic solution Place 1 drop into both eyes at bedtime.   12/31/2017 at Unknown time  . HYDROcodone-acetaminophen (NORCO) 10-325 MG tablet Take 1 tablet by mouth every 6 (six) hours as needed for moderate pain.   0 unknown  . polyethylene glycol (MIRALAX / GLYCOLAX) packet Take 17 g by mouth daily as needed. For constipation.   unknown  . senna (SENOKOT) 8.6 MG tablet Take 1-2 tablets by mouth daily as needed for constipation.   unknown    SH: Social History   Tobacco Use  . Smoking status: Never Smoker  . Smokeless tobacco: Never Used  Substance Use Topics  . Alcohol use: No  . Drug use: No    MEDS: Prior to Admission medications     Medication Sig Start Date End Date Taking? Authorizing Provider  acetaminophen (TYLENOL) 500 MG tablet Take 500 mg by mouth every 6 (six) hours as needed for moderate pain.   Yes [provider]  aspirin EC 81 MG tablet Take 81 mg by mouth daily.   Yes [provider]  feeding supplement, ENSURE ENLIVE, (ENSURE ENLIVE) LIQD Take 237 mLs by mouth daily at 3 pm. Patient taking differently: Take 237 mLs by mouth daily.  08/19/15  Yes Robbie Lis, MD  levothyroxine (SYNTHROID, LEVOTHROID) 25 MCG tablet Take 25 mcg by mouth daily before breakfast. 10/08/15  Yes [provider]  Multiple Vitamin (MULTIVITAMIN WITH MINERALS) TABS tablet Take 1 tablet by mouth daily.   Yes [provider]  TRAVATAN Z 0.004 % SOLN ophthalmic solution Place 1 drop into both eyes at bedtime. 05/05/15  Yes [provider]  HYDROcodone-acetaminophen (NORCO) 10-325 MG tablet Take 1 tablet by mouth every 6 (six) hours as needed for moderate pain.  09/25/17   [provider]  polyethylene glycol (MIRALAX / GLYCOLAX) packet Take 17 g by mouth daily as needed. For constipation.    [provider]  senna (SENOKOT) 8.6 MG tablet Take 1-2 tablets by mouth daily as needed for constipation.    [provider]    ALLERGY: Allergies  Allergen Reactions  . Ciprofloxacin Hives  . Sulfa Antibiotics Hives  . Tramadol Other (See Comments)    Altered Mental status    Social History   Tobacco Use  . Smoking status: Never Smoker  . Smokeless tobacco: Never Used  Substance Use Topics  . Alcohol use: No     Family History  Problem Relation Age of Onset  . Heart attack Unknown      ROS   ROS unable to obtain, AMS  Exam   Vitals:   12/20/2017 1932 01/11/2018 1948  BP: (!) 146/85   Pulse: 63   Resp: (!) 26   Temp:    SpO2: 92% 91%   Cachetic elderly male PERRL Right facial droop Does not follow commands Opens eyes occasionally to pain Moves LUE  spontaneously Localizes with LUE W/D LLE and RLE to painful stimulus, L>R  Results - Imaging/Labs   Results for orders placed or performed during the hospital encounter of 12/19/2017 (from the past 48 hour(s))  CBC     Status: Abnormal   Collection Time: 01/12/2018  3:30 PM  Result Value Ref Range   WBC 6.9 4.0 - 10.5 K/uL   RBC 3.90 (L) 4.22 - 5.81 MIL/uL   Hemoglobin 13.1 13.0 - 17.0 g/dL   HCT 40.5 39.0 - 52.0 %   MCV 103.8 (H) 80.0 - 100.0 fL   MCH 33.6

## 2018-01-01 NOTE — H&P (Addendum)
History and Physical  [name] GNF:[id number] DOB: [birth year] DOA: 12/23/2017  Referring physician: Jola Schmidt, ER physician PCP: Lavone Orn, MD  Outpatient Specialists: None Patient coming from: Home & is able to ambulate with use of a cane  Chief Complaint: Fall  HPI: [name] is a 84 y.o. male with medical history significant for dementia, hypothyroidism and BPH who was brought into the emergency room after sustaining a mechanical fall.  This was witnessed by his wife.  Patient confused, agitated on admission.  Presented with trauma to his right forehead and parietal area of his scalp.  Only on aspirin.  ED Course: In the emergency room, noted to be tachycardic and then went into atrial fibrillation.  Initially breathing comfortably on room air, and required 2 L nasal cannula.  Lab work unremarkable, but CT scan of head noted left occipital lobe hematoma, left convexity subdural hematoma and small amount of subarachnoid blood in the left temporal region.  Neurosurgery reviewed films and recommended no surgical intervention at this time.  Recommended repeat head CT in the morning, neurochecks and starting patient on Keppra twice daily..    Review of Systems: Patient seen in the emergency room.  He is confused, unable to really give me any kind of review of systems, minimally verbal.   Past Medical History:  Diagnosis Date  . Arthritis    back "issues"  . Enlarged prostate   . Foley catheter in place    remains in place to leg bag.  Marland Kitchen Hearing difficulty of both ears    hearing aids-sometimes  . Hypothyroidism    recently diagnosed  . Neuromuscular disorder ([place])    periodic sciatic nerve pain left side.  . Renal failure (ARF), acute on chronic [facility])    7'17 episode of acute renal failure with hospital visit after Kyphoplasty procedure, urinary retention found, and Foley catheter placed to relieve.  . Sight impaired    left eye "detached retina"- limited vision  left eye.   Past Surgical History:  Procedure Laterality Date  . ABDOMINAL HERNIA REPAIR    . BACK SURGERY     herniated disc  . CARPAL TUNNEL RELEASE Bilateral   . CATARACT EXTRACTION    . EYE SURGERY Left    x2 left eye detached retina-continues to be treated" some vision loss"  . HERNIA REPAIR Right    right groin hernia repair  . INSERTION OF SUPRAPUBIC CATHETER N/A 11/05/2015   Procedure: INSERTION OF SUPRAPUBIC CATHETER;  Surgeon: Kathie Rhodes, MD;  Location: [facility];  Service: Urology;  Laterality: N/A;  . KNEE ARTHROSCOPY WITH EXCISION BAKER'S CYST    . KYPHOPLASTY     08-03-15   . SEPTOPLASTY    . TESTICLE REMOVAL Left    ? why  . TRANSURETHRAL RESECTION OF PROSTATE N/A 11/05/2015   Procedure: TRANSURETHRAL RESECTION OF THE PROSTATE (TURP) WITH GYRUS;  Surgeon: Kathie Rhodes, MD;  Location: [facility];  Service: Urology;  Laterality: N/A;  . VASECTOMY      Social History:  reports that he has never smoked. He has never used smokeless tobacco. He reports that he does not drink alcohol or use drugs.  History obtained and confirmed from wife.  Ambulates with a cane.  Lives at home with her.   Allergies  Allergen Reactions  . Ciprofloxacin Hives  . Sulfa Antibiotics Hives  . Tramadol Other (See Comments)    Altered Mental status    Family History  Problem Relation Age of Onset  .  Heart attack Unknown       Prior to Admission medications   Medication Sig Start Date End Date Taking? Authorizing Provider  acetaminophen (TYLENOL) 500 MG tablet Take 500 mg by mouth every 6 (six) hours as needed for moderate pain.   Yes [provider]  aspirin EC 81 MG tablet Take 81 mg by mouth daily.   Yes [provider]  feeding supplement, ENSURE ENLIVE, (ENSURE ENLIVE) LIQD Take 237 mLs by mouth daily at 3 pm. Patient taking differently: Take 237 mLs by mouth daily.  08/19/15  Yes Robbie Lis, MD  levothyroxine (SYNTHROID, LEVOTHROID) 25 MCG tablet Take 25 mcg by mouth  daily before breakfast. 10/08/15  Yes [provider]  Multiple Vitamin (MULTIVITAMIN WITH MINERALS) TABS tablet Take 1 tablet by mouth daily.   Yes [provider]  TRAVATAN Z 0.004 % SOLN ophthalmic solution Place 1 drop into both eyes at bedtime. 05/05/15  Yes [provider]  HYDROcodone-acetaminophen (NORCO) 10-325 MG tablet Take 1 tablet by mouth every 6 (six) hours as needed for moderate pain.  09/25/17   [provider]  polyethylene glycol (MIRALAX / GLYCOLAX) packet Take 17 g by mouth daily as needed. For constipation.    [provider]  senna (SENOKOT) 8.6 MG tablet Take 1-2 tablets by mouth daily as needed for constipation.    [provider]    Physical Exam: BP (!) 158/109   Pulse (!) 41   Temp 97.9 F (36.6 C) (Oral)   Resp (!) 28   SpO2 94%   General: Confused, agitated, not talking at this time Eyes: Sclera appear nonicteric, extraocular movements are intact ENT: Normocephalic and atraumatic, mucous memories slightly dry Neck: Supple, no JVD Cardiovascular: Irregular rhythm, tachycardic Respiratory: Decreased breath sounds bibasilar Abdomen: Soft, nontender, nondistended, positive bowel sounds Skin: Contusion over left side of head Musculoskeletal: No clubbing cyanosis or edema Psychiatric: Confused currently Neurologic: Exam extremely limited due to patient's confusion, decreased level of responsiveness          Labs on Admission:  Basic Metabolic Panel: Recent Labs  Lab 01/06/2018 1530  NA 139  K 3.8  CL 101  CO2 30  GLUCOSE 115*  BUN 17  CREATININE 0.69  CALCIUM 8.7*   Liver Function Tests: No results for input(s): AST, ALT, ALKPHOS, BILITOT, PROT, ALBUMIN in the last 168 hours. No results for input(s): LIPASE, AMYLASE in the last 168 hours. No results for input(s): AMMONIA in the last 168 hours. CBC: Recent Labs  Lab 01/03/2018 1530  WBC 6.9  HGB 13.1  HCT 40.5  MCV 103.8*  PLT 203   Cardiac  Enzymes: No results for input(s): CKTOTAL, CKMB, CKMBINDEX, TROPONINI in the last 168 hours.  BNP (last 3 results) No results for input(s): BNP in the last 8760 hours.  ProBNP (last 3 results) No results for input(s): PROBNP in the last 8760 hours.  CBG: No results for input(s): GLUCAP in the last 168 hours.  Radiological Exams on Admission: Dg Chest 1 View  Result Date: 01/06/2018 CLINICAL DATA:  Fall with pain EXAM: CHEST  1 VIEW COMPARISON:  08/29/2017 FINDINGS: Chronic cardiomegaly.  Aortic tortuosity. Large lung volumes and interstitial coarsening. No consolidation, effusion, or pneumothorax. No acute osseous finding as permitted by osteopenia and rotation. IMPRESSION: Cardiomegaly and borderline vascular congestion. Electronically Signed   By: Monte Fantasia M.D.   On: 12/25/2017 15:25   Ct Head Wo Contrast  Result Date: 12/30/2017 CLINICAL DATA:  84 year old male  post fall.  Initial encounter. EXAM: CT HEAD WITHOUT CONTRAST CT CERVICAL SPINE WITHOUT CONTRAST TECHNIQUE: Multidetector CT imaging of the head and cervical spine was performed following the standard protocol without intravenous contrast. Multiplanar CT image reconstructions of the cervical spine were also generated. COMPARISON:  08/21/2015. FINDINGS: CT HEAD FINDINGS Brain: Left occipital lobe hematoma spanning over 5.6 x 2.4 x 3.8 cm with surrounding vasogenic edema. Broad-based left convexity subdural hematoma measuring up to 4 mm maximal thickness. Small amount of subarachnoid blood left temporal region and occipital region. Tiny posterior left parafalcine blood may be subarachnoid or subdural in location. Question artifact versus tiny amount of blood along the periphery of the right temporal lobe. Chronic microvascular changes. Global atrophy. No intracranial mass lesion noted on this unenhanced exam. Vascular: Vascular calcifications Skull: No skull fracture. Left nasal bone fracture of questionable age. Sinuses/Orbits:  Post lens replacement. Globes appear to be grossly intact. Mucosal thickening/opacification left frontal sinus without surrounding fracture. Opacification ethmoid sinus air cells bilaterally. Polypoid opacification anterior sphenoid sinus. Other: Mastoid air cells and middle ear cavities are clear. CT CERVICAL SPINE FINDINGS Alignment: Curvature cervical spine. Skull base and vertebrae: No cervical spine fracture noted. Minimal interval loss of height T1 vertebral body predominantly involving the inferior endplate suggestive of small Schmorl's node deformity of indeterminate age. Soft tissues and spinal canal: No abnormal prevertebral soft tissue swelling. Disc levels: Multilevel cervical spondylotic changes with various degrees of spinal stenosis and foraminal narrowing. Upper chest: Bony overgrowth sternoclavicular region incompletely assessed. This was noted previously. Other: No worrisome neck mass. IMPRESSION: CT HEAD: 1. Left occipital lobe hematoma spanning over 5.6 x 2.4 x 3.8 cm with surrounding vasogenic edema. 2. Broad-based left convexity subdural hematoma measuring up to 4 mm maximal thickness. 3. Small amount of subarachnoid blood left temporal region and occipital region. 4. Tiny posterior left parafalcine blood may be subarachnoid or subdural in location. 5. Question artifact versus tiny amount of blood along the periphery of the right temporal lobe. 6. Chronic microvascular changes. Global atrophy. 7. Left nasal bone fracture of questionable age. 8. Mucosal thickening/opacification left frontal sinus without surrounding fracture. Opacification ethmoid sinus air cells bilaterally. Polypoid opacification anterior sphenoid sinus. CT CERVICAL SPINE: 1. Curvature cervical spine. No cervical spine fracture or abnormal prevertebral soft tissue swelling noted. 2. Minimal interval loss of height T1 vertebral body predominantly involving the inferior endplate suggestive of small Schmorl's node deformity of  indeterminate age. 3. Multilevel cervical spondylotic changes with various degrees of spinal stenosis and foraminal narrowing. These results were called by telephone at the time of interpretation on 01/10/2018 at 3:14 pm to Dr. Jola Schmidt , who verbally acknowledged these results. Electronically Signed   By: Genia Del M.D.   On: 01/03/2018 15:41   Ct Cervical Spine Wo Contrast  Result Date: 12/29/2017 CLINICAL DATA:  84 year old male post fall.  Initial encounter. EXAM: CT HEAD WITHOUT CONTRAST CT CERVICAL SPINE WITHOUT CONTRAST TECHNIQUE: Multidetector CT imaging of the head and cervical spine was performed following the standard protocol without intravenous contrast. Multiplanar CT image reconstructions of the cervical spine were also generated. COMPARISON:  08/21/2015. FINDINGS: CT HEAD FINDINGS Brain: Left occipital lobe hematoma spanning over 5.6 x 2.4 x 3.8 cm with surrounding vasogenic edema. Broad-based left convexity subdural hematoma measuring up to 4 mm maximal thickness. Small amount of subarachnoid blood left temporal region and occipital region. Tiny posterior left parafalcine blood may be subarachnoid or subdural in location. Question artifact versus tiny amount of blood  along the periphery of the right temporal lobe. Chronic microvascular changes. Global atrophy. No intracranial mass lesion noted on this unenhanced exam. Vascular: Vascular calcifications Skull: No skull fracture. Left nasal bone fracture of questionable age. Sinuses/Orbits: Post lens replacement. Globes appear to be grossly intact. Mucosal thickening/opacification left frontal sinus without surrounding fracture. Opacification ethmoid sinus air cells bilaterally. Polypoid opacification anterior sphenoid sinus. Other: Mastoid air cells and middle ear cavities are clear. CT CERVICAL SPINE FINDINGS Alignment: Curvature cervical spine. Skull base and vertebrae: No cervical spine fracture noted. Minimal interval loss of height  T1 vertebral body predominantly involving the inferior endplate suggestive of small Schmorl's node deformity of indeterminate age. Soft tissues and spinal canal: No abnormal prevertebral soft tissue swelling. Disc levels: Multilevel cervical spondylotic changes with various degrees of spinal stenosis and foraminal narrowing. Upper chest: Bony overgrowth sternoclavicular region incompletely assessed. This was noted previously. Other: No worrisome neck mass. IMPRESSION: CT HEAD: 1. Left occipital lobe hematoma spanning over 5.6 x 2.4 x 3.8 cm with surrounding vasogenic edema. 2. Broad-based left convexity subdural hematoma measuring up to 4 mm maximal thickness. 3. Small amount of subarachnoid blood left temporal region and occipital region. 4. Tiny posterior left parafalcine blood may be subarachnoid or subdural in location. 5. Question artifact versus tiny amount of blood along the periphery of the right temporal lobe. 6. Chronic microvascular changes. Global atrophy. 7. Left nasal bone fracture of questionable age. 8. Mucosal thickening/opacification left frontal sinus without surrounding fracture. Opacification ethmoid sinus air cells bilaterally. Polypoid opacification anterior sphenoid sinus. CT CERVICAL SPINE: 1. Curvature cervical spine. No cervical spine fracture or abnormal prevertebral soft tissue swelling noted. 2. Minimal interval loss of height T1 vertebral body predominantly involving the inferior endplate suggestive of small Schmorl's node deformity of indeterminate age. 3. Multilevel cervical spondylotic changes with various degrees of spinal stenosis and foraminal narrowing. These results were called by telephone at the time of interpretation on 12/27/2017 at 3:14 pm to Dr. Jola Schmidt , who verbally acknowledged these results. Electronically Signed   By: Genia Del M.D.   On: 12/26/2017 15:41   Dg Hip Unilat W Or Wo Pelvis 2-3 Views Right  Result Date: 01/07/2018 CLINICAL DATA:  Right hip  pain after fall. EXAM: DG HIP (WITH OR WITHOUT PELVIS) 2-3V RIGHT COMPARISON:  None. FINDINGS: There is no evidence of hip fracture or dislocation. There is no evidence of arthropathy or other focal bone abnormality. IMPRESSION: Negative. Electronically Signed   By: Marijo Conception, M.D.   On: 12/15/2017 15:26    EKG: Ordered and is pending  Assessment/Plan Present on Admission: . SDH (subdural hematoma) (HCC)/left occipital hematoma/subarachnoid hemorrhage: Neurosurgery following.  Transfer to ICU at [facility]  Keppra 500 twice daily.  Neuro exam every 2 hours.  Hold aspirin.  Repeat head CT in the morning.  Marland Kitchen Hypothyroidism: While n.p.o., IV Synthroid  . Benign prostatic hyperplasia with urinary retention: Currently stable.  . Fall: Wife reports mechanical fall, but I suspect A. fib/hypoxia may have been underlying issue.  . New onset a-fib [facility]): No previous history.  Have started IV Cardizem following Cardizem bolus.  Patient going to ICU.  Obviously, no anticoagulation due to head bleed.  Senile dementia with acute behavioral disturbance: Secondary to trauma and head bleed plus hypoxia.  Marland Kitchen Acute respiratory failure with hypoxia [facility]): Suspect mild volume overload.  Chest x-ray notes cardiomegaly and pulmonary vascular congestion.  No previous history of CHF and no previous echocardiogram done.  Will  give 1 dose of IV Lasix now.  Continue supplemental oxygen.  Echocardiogram ordered for tomorrow.  Active Problems:   Benign prostatic hyperplasia with urinary retention   SDH (subdural hematoma) (HCC)   Hypothyroidism   Fall   New onset a-fib ([place])   Acute respiratory failure with hypoxia (HCC)   DVT prophylaxis: SCDs  Code Status: DNR as confirmed by wife  Family Communication: Wife and son at the bedside  Disposition Plan: Discharge if bleeding, A. fib stabilized, CHF worked up  [employer] called: Neurosurgery  Admission status: Given expectation will be here past 2  midnights requiring acute hospital services, have admitted as inpatient  I have spent 40 minutes in the care of this acutely ill patient including medical decision making, review of films and lab studies, previous records and bedside examination  Annita Brod MD [facility] Pager [phone]  If 7PM-7AM, please contact night-coverage [email or web address] Password [email or web address]  12/28/2017, 6:06 PM

## 2018-01-01 NOTE — ED Notes (Signed)
Patient transported to CT 

## 2018-01-01 NOTE — Consult Note (Signed)
NAME:  [name], MRN:  [id number], DOB:  February 19, 1925, LOS: 0 ADMISSION DATE:  01/03/2018, CONSULTATION DATE:  12/21/2017 REFERRING MD:  [place] Pink  CHIEF COMPLAINT:  AMS   Brief History   [name] is a 84 y.o. male who was admitted 11/19 with a left SDH and small left SAH after a mechanical fall.  He was transferred from [facility] to [facility].  History of present illness   [name] is a 84 y.o. male who has a PMH as outlined below (see "past medical history").  He presented to [facility] ED 11/19 after a mechanical fall that was witnessed by his wife.  He was found to have a left SDH and small SAH in the left temporal region.  Case was discussed with neurosurgery who recommended medical management with no surgical interventions along with transfer to [facility] for closer monitoring.  Per discussions with neurosurgery PA and family, if pt were to deteriorate, will move towards comfort measures as he is a poor candidate for any surgical interventions.  PCCM was called for transfer to [facility] ICU.  Pt is not on any anticoagulation but does take 81mg  aspirin daily.  Past Medical History  CKD, hypothyroidism, dementia.  Significant Hospital Events   11/19 > admit.  Consults:  Neurosurgery. PCCM.  Procedures:  None.  Significant Diagnostic Tests:  CT head and C spine 11/19 > left occipital lobe hematoma, left SDH 62mm thickness, small SAH left temporal region, tiny posterior left parafalcine blood, chronic microvascular changes, global atrophy, left nasal bone fx.  No cervical spine fx.  Multilevel cervical spondylotic changes with various degrees of spinal stenosis and foraminal narrowing. CXR 11/19 > vascular congestion. R hip 11/19 > negative. CT head 11/20 >  Echo 11/20 >   Micro Data:  None.  Antimicrobials:  None.   Interim history/subjective:  Somnolent.  Comfortable.  Objective:  Blood pressure (!) 148/91, pulse (!) 51, temperature 97.9 F (36.6 C), temperature source Oral, resp. rate  20, SpO2 93 %.       No intake or output data in the 24 hours ending 01/09/2018 1914 There were no vitals filed for this visit.  Examination: General: Adult male, in NAD. Neuro: Somnolent, withdraws to noxious stimuli.  Does not answer questions or follow commands. HEENT: Bruising to right scalp.. Sclerae anicteric. Cardiovascular: RRR, no M/R/G.  Lungs: Respirations even and unlabored.  CTA bilaterally, No W/R/R.  Abdomen: BS x 4, soft, NT/ND.  Musculoskeletal: No gross deformities, no edema.  Skin: Intact, warm, no rashes.  Assessment & Plan:   Left SDH with small left sided SAH and left occipital hemorrhagic contusion - after a witnessed mechanical fall. - Neurosurgery following and recommending conservative management. - If pt deteriorates, will proceed with comfort measures as pt is poor candidate for surgical interventions. - Continue Keppra per neurosurgery. - Frequent neuro checks.  A.fib - new onset. - Continue cardizem. - No anticoagulation due to SDH / SAH. - F/u echo.  Acute hypoxic respiratory failure - felt to be due to acute pulmonary edema.  Now s/p 1 dose 20mg  lasix. - Continue supplemental O2 as needed to maintain SpO2 > 92%. - F/u echo.  Hx hypothyroidism. - Continue synthroid, change to IV formulation.  Best Practice:  Diet: NPO. Pain/Anxiety/Delirium protocol (if indicated): N/A. VAP protocol (if indicated): N/A. DVT prophylaxis: SCD's. GI prophylaxis: N/A. Glucose control: N/A. Mobility: Bedrest. Code Status: DNR.  Per discussions with neurosurgery PA and family, if pt were to deteriorate, will  move towards comfort measures as he is a poor candidate for any surgical interventions. Family Communication: Wife and son updated at bedside. Disposition: ICU.  Labs   CBC: Recent Labs  Lab 12/17/2017 1530  WBC 6.9  HGB 13.1  HCT 40.5  MCV 103.8*  PLT 790   Basic Metabolic Panel: Recent Labs  Lab 12/16/2017 1530  NA 139  K 3.8  CL 101  CO2 30    GLUCOSE 115*  BUN 17  CREATININE 0.69  CALCIUM 8.7*   GFR: CrCl cannot be calculated (Unknown ideal weight.). Recent Labs  Lab 12/15/2017 1530  WBC 6.9   Liver Function Tests: No results for input(s): AST, ALT, ALKPHOS, BILITOT, PROT, ALBUMIN in the last 168 hours. No results for input(s): LIPASE, AMYLASE in the last 168 hours. No results for input(s): AMMONIA in the last 168 hours. ABG    Component Value Date/Time   TCO2 25 08/15/2015 0558    Coagulation Profile: Recent Labs  Lab 12/22/2017 1530  INR 1.04   Cardiac Enzymes: No results for input(s): CKTOTAL, CKMB, CKMBINDEX, TROPONINI in the last 168 hours. HbA1C: No results found for: HGBA1C CBG: No results for input(s): GLUCAP in the last 168 hours.  Review of Systems:   Unable to obtain as pt is encephalopathic.  Past medical history  He,  has a past medical history of Arthritis, Enlarged prostate, Foley catheter in place, Hearing difficulty of both ears, Hypothyroidism, Neuromuscular disorder ([place]), Renal failure (ARF), acute on chronic ([place]), and Sight impaired.   Surgical History    Past Surgical History:  Procedure Laterality Date  . ABDOMINAL HERNIA REPAIR    . BACK SURGERY     herniated disc  . CARPAL TUNNEL RELEASE Bilateral   . CATARACT EXTRACTION    . EYE SURGERY Left    x2 left eye detached retina-continues to be treated" some vision loss"  . HERNIA REPAIR Right    right groin hernia repair  . INSERTION OF SUPRAPUBIC CATHETER N/A 11/05/2015   Procedure: INSERTION OF SUPRAPUBIC CATHETER;  Surgeon: Kathie Rhodes, MD;  Location: [facility];  Service: Urology;  Laterality: N/A;  . KNEE ARTHROSCOPY WITH EXCISION BAKER'S CYST    . KYPHOPLASTY     08-03-15   . SEPTOPLASTY    . TESTICLE REMOVAL Left    ? why  . TRANSURETHRAL RESECTION OF PROSTATE N/A 11/05/2015   Procedure: TRANSURETHRAL RESECTION OF THE PROSTATE (TURP) WITH GYRUS;  Surgeon: Kathie Rhodes, MD;  Location: [facility];  Service: Urology;  Laterality:  N/A;  . VASECTOMY       Social History   reports that he has never smoked. He has never used smokeless tobacco. He reports that he does not drink alcohol or use drugs.   Family history   His family history includes Heart attack in his unknown relative.   Allergies Allergies  Allergen Reactions  . Ciprofloxacin Hives  . Sulfa Antibiotics Hives  . Tramadol Other (See Comments)    Altered Mental status     Home meds  Prior to Admission medications   Medication Sig Start Date End Date Taking? Authorizing Provider  acetaminophen (TYLENOL) 500 MG tablet Take 500 mg by mouth every 6 (six) hours as needed for moderate pain.   Yes [provider]  aspirin EC 81 MG tablet Take 81 mg by mouth daily.   Yes [provider]  feeding supplement, ENSURE ENLIVE, (ENSURE ENLIVE) LIQD Take 237 mLs by mouth daily at 3 pm. Patient taking differently:  Take 237 mLs by mouth daily.  08/19/15  Yes Robbie Lis, MD  levothyroxine (SYNTHROID, LEVOTHROID) 25 MCG tablet Take 25 mcg by mouth daily before breakfast. 10/08/15  Yes [provider]  Multiple Vitamin (MULTIVITAMIN WITH MINERALS) TABS tablet Take 1 tablet by mouth daily.   Yes [provider]  TRAVATAN Z 0.004 % SOLN ophthalmic solution Place 1 drop into both eyes at bedtime. 05/05/15  Yes [provider]  HYDROcodone-acetaminophen (NORCO) 10-325 MG tablet Take 1 tablet by mouth every 6 (six) hours as needed for moderate pain.  09/25/17   [provider]  polyethylene glycol (MIRALAX / GLYCOLAX) packet Take 17 g by mouth daily as needed. For constipation.    [provider]  senna (SENOKOT) 8.6 MG tablet Take 1-2 tablets by mouth daily as needed for constipation.    [provider]    Montey Hora, [facility] Pager: [phone]  or [phone] 12/14/2017, 9:23 PM

## 2018-01-01 NOTE — ED Notes (Signed)
Pt remains in CT

## 2018-01-01 NOTE — Progress Notes (Signed)
Received call from Dr Venora Maples, [place] at [facility], regarding patient. [name] is an 84 year old male who had a mechanical fall earlier today and struck head. Reportedly concussed with repetitive questioning but otherwise moving all extremities. He is on 81mg  ASA daily. Complaining of right hip pain.  Reviewed Head CT which is significant for left occipital hematoma, left convexity SDH and small amount of SAH left temporal region. No MLS. There is no indication for acute NS intervention. He will be admitted under [facility] for monitoring to SDU. - Keppra 500mg  BIDx7days for seizure prophylaxis - Monitor neuro exam q 2 hours, report any change - Repeat head CT tomorrow am, sooner as indicated by nero exam - D/C ASA  Will follow.

## 2018-01-01 NOTE — ED Notes (Signed)
Patient arrived by EMS from home. Patient had fall at home. The patient hit head during fall, bruise is located on right side of head. Patient is not on blood thinners. EMS placed C-collar on patient. Patient has hx of dementia.

## 2018-01-01 NOTE — ED Notes (Addendum)
Hospitalist paged due to pts worsening restlessness and pain. Pt pulling at wires. Per MD administer Morphine and page Neurosurgery

## 2018-01-01 NOTE — ED Notes (Signed)
ED Provider at bedside. 

## 2018-01-01 NOTE — ED Notes (Addendum)
Pt unable to follow commands. Per family pt has advanced dementia, does not follow commands frequently at baseline. Pt moving all extremities without difficulty.

## 2018-01-01 NOTE — ED Notes (Signed)
Pt unable to follow commands. Per family pt has advanced dementia had does not follow commands frequently at baseline. Pt moving all extremities.

## 2018-01-02 ENCOUNTER — Inpatient Hospital Stay (HOSPITAL_COMMUNITY): Payer: Medicare Other

## 2018-01-02 DIAGNOSIS — R4020 Unspecified coma: Secondary | ICD-10-CM

## 2018-01-02 DIAGNOSIS — Z515 Encounter for palliative care: Secondary | ICD-10-CM

## 2018-01-02 DIAGNOSIS — Z7189 Other specified counseling: Secondary | ICD-10-CM

## 2018-01-02 LAB — MRSA PCR SCREENING: MRSA BY PCR: NEGATIVE

## 2018-01-02 LAB — COMPREHENSIVE METABOLIC PANEL
ALK PHOS: 66 U/L (ref 38–126)
ALT: 32 U/L (ref 0–44)
ANION GAP: 10 (ref 5–15)
AST: 42 U/L — ABNORMAL HIGH (ref 15–41)
Albumin: 3.5 g/dL (ref 3.5–5.0)
BILIRUBIN TOTAL: 0.9 mg/dL (ref 0.3–1.2)
BUN: 15 mg/dL (ref 8–23)
CO2: 26 mmol/L (ref 22–32)
Calcium: 8.8 mg/dL — ABNORMAL LOW (ref 8.9–10.3)
Chloride: 98 mmol/L (ref 98–111)
Creatinine, Ser: 0.88 mg/dL (ref 0.61–1.24)
Glucose, Bld: 193 mg/dL — ABNORMAL HIGH (ref 70–99)
Potassium: 3.8 mmol/L (ref 3.5–5.1)
SODIUM: 134 mmol/L — AB (ref 135–145)
TOTAL PROTEIN: 6.6 g/dL (ref 6.5–8.1)

## 2018-01-02 LAB — CBC
HCT: 40.1 % (ref 39.0–52.0)
HEMOGLOBIN: 13.1 g/dL (ref 13.0–17.0)
MCH: 32 pg (ref 26.0–34.0)
MCHC: 32.7 g/dL (ref 30.0–36.0)
MCV: 97.8 fL (ref 80.0–100.0)
Platelets: 187 10*3/uL (ref 150–400)
RBC: 4.1 MIL/uL — ABNORMAL LOW (ref 4.22–5.81)
RDW: 13.3 % (ref 11.5–15.5)
WBC: 10.4 10*3/uL (ref 4.0–10.5)
nRBC: 0 % (ref 0.0–0.2)

## 2018-01-02 MED ORDER — GLYCOPYRROLATE 0.2 MG/ML IJ SOLN: 0.2000 mg | INTRAMUSCULAR | Status: DC | PRN

## 2018-01-02 MED ORDER — LORAZEPAM 2 MG/ML PO CONC: 1.0000 mg | ORAL | Status: DC | PRN

## 2018-01-02 MED ORDER — MORPHINE SULFATE (PF) 2 MG/ML IV SOLN
1.0000 mg | INTRAVENOUS | Status: DC | PRN
  Administered 2018-01-02: 1 mg via INTRAVENOUS
  Administered 2018-01-02: 2 mg via INTRAVENOUS
  Filled 2018-01-02 (×2): qty 1

## 2018-01-02 MED ORDER — LORAZEPAM 1 MG PO TABS: 1.0000 mg | ORAL_TABLET | ORAL | Status: DC | PRN

## 2018-01-02 MED ORDER — HALOPERIDOL LACTATE 2 MG/ML PO CONC
0.5000 mg | ORAL | Status: DC | PRN
  Filled 2018-01-02: qty 0.3

## 2018-01-02 MED ORDER — MORPHINE BOLUS VIA INFUSION
1.0000 mg | INTRAVENOUS | Status: DC | PRN
  Administered 2018-01-04 (×3): 1 mg via INTRAVENOUS
  Filled 2018-01-02: qty 1

## 2018-01-02 MED ORDER — HALOPERIDOL 0.5 MG PO TABS
0.5000 mg | ORAL_TABLET | ORAL | Status: DC | PRN
  Filled 2018-01-02: qty 1

## 2018-01-02 MED ORDER — HALOPERIDOL LACTATE 5 MG/ML IJ SOLN: 0.5000 mg | INTRAMUSCULAR | Status: DC | PRN

## 2018-01-02 MED ORDER — MORPHINE 100MG IN NS 100ML (1MG/ML) PREMIX INFUSION
4.0000 mg/h | INTRAVENOUS | Status: DC
  Administered 2018-01-02: 1 mg/h via INTRAVENOUS
  Administered 2018-01-03: 2 mg/h via INTRAVENOUS
  Filled 2018-01-02 (×3): qty 100

## 2018-01-02 MED ORDER — LORAZEPAM 2 MG/ML IJ SOLN: 1.0000 mg | INTRAMUSCULAR | Status: DC | PRN

## 2018-01-02 NOTE — Progress Notes (Signed)
Chaplain responded to spiritual care consult.  Pt receiving comfort care/end of life. Wife of 33 years bedside.  She had been widowed twice earlier in life so she assured me "I've done this before."  [employer] of presence and prayer. Will be available as needed. Tamsen Snider  pager [phone]

## 2018-01-02 NOTE — Consult Note (Signed)
Consultation Note Date: 01/02/2018   Patient Name: [name]  DOB: 1925-05-13  MRN: [id number]  Age / Sex: 84 y.o., male  PCP: Lavone Orn, MD Referring Physician: Kipp Brood, MD  Reason for Consultation: Establishing goals of care and Terminal Care  HPI/Patient Profile: 84 y.o. male  with past medical history of dementia, arthritis, left detached retina, enlarged prostate admitted on 12/26/2017 with fall while walking from his car to his home. Initial CT scan showed left occipital lobe hematoma, L convexity subdural hematomy, and small amount of subarachnoid blood in the left temporal region, no surgical intervention was recommended by neurosurgery. Additionally, found to be in new onset a fib with hypoxia requiring 2L O2. Plan was made to treat medically and transition to comfort if patient declined further. This morning patient was clinically worse and repeat CT scan showed extension of ICH with new intraventricular hemorrhage with midline shift. Patient was transitioned to comfort measures and palliative medicine was consulted for assistance with end of life care.     Clinical Assessment and Goals of Care: Patient in bed, brow furrowed, appears uncomfortable. I met with patient's wife, [name].  Brief life review of patient was conducted- patient and [name] have been married for 33 years. 35 first husband died when her son was [age] years old. [name] and [name] met at church, their children introduced them to each other. Patient has children from previous marriage who are aware of patient's condition and in agreement with plan.  What is most important to [name] is that patient does not suffer. We discussed comfort care- [name] feels that patient is currently uncomfortable. She states he is intermittently agitated and his brow is furrowed. He grabs her hands and squeezes very hard. Her hope is that he can  achieve some peace and comfort and die without suffering.  Discussed starting continuous infusion of low dose pain medication to ensure patient is receiving adequate pain management. [name] agrees with plan.  Discussed disposition- [name] is understanding that patient may die within hours or days. She notes that location is not important to her as long as he is cared for and does not suffer.  Primary Decision Maker NEXT OF KIN- spouse [name]    SUMMARY OF RECOMMENDATIONS -Initiate morphine infusion 1-63m/hr with bolus dosing available 159mq15 min prn for signs of pain or discomfort, or SOB -lorazepam 35m28mv for anxiety, or sob not controlled with morphine -haldol .5mg26mhrs prn for agitation -robinul .2mg 42mq2hr prn for terminal secretions -Will reevaluate patient's stability for transfer to residential hospice tomorrow once patient has been fully transitioned to comfort measures and prognosis can better be determined.  -Palliative Chaplain consult for EOL support   -Do not check pulse ox, do not titrate O2 up, give morphine bolus for SOB or signs of air hunger  Code Status/Advance Care Planning:  DNR   Additional Recommendations (Limitations, Scope, Preferences):  Full Comfort Care  Psycho-social/Spiritual:   Desire for further Chaplaincy support:yes  Additional Recommendations: Grief/Bereavement Support  Prognosis:    <  2 weeks  Discharge Planning: To Be Determined  Primary Diagnoses: Present on Admission: . SDH (subdural hematoma) ([place]) . Hypothyroidism . Benign prostatic hyperplasia with urinary retention . Fall . New onset a-fib ([place]) . Acute respiratory failure with hypoxia ([place]) . Subdural hematoma ([place])   I have reviewed the medical record, interviewed the patient and family, and examined the patient. The following aspects are pertinent.  Past Medical History:  Diagnosis Date  . Arthritis    back "issues"  . Enlarged prostate   . Foley catheter in  place    remains in place to leg bag.  Marland Kitchen Hearing difficulty of both ears    hearing aids-sometimes  . Hypothyroidism    recently diagnosed  . Neuromuscular disorder ([place])    periodic sciatic nerve pain left side.  . Renal failure (ARF), acute on chronic [facility])    7'17 episode of acute renal failure with hospital visit after Kyphoplasty procedure, urinary retention found, and Foley catheter placed to relieve.  . Sight impaired    left eye "detached retina"- limited vision left eye.   Social History   Socioeconomic History  . Marital status: Married    Spouse name: Not on file  . Number of children: Not on file  . Years of education: Not on file  . Highest education level: Not on file  Occupational History  . Not on file  Social Needs  . Financial resource strain: Not on file  . Food insecurity:    Worry: Not on file    Inability: Not on file  . Transportation needs:    Medical: Not on file    Non-medical: Not on file  Tobacco Use  . Smoking status: Never Smoker  . Smokeless tobacco: Never Used  Substance and Sexual Activity  . Alcohol use: No  . Drug use: No  . Sexual activity: Not Currently  Lifestyle  . Physical activity:    Days per week: Not on file    Minutes per session: Not on file  . Stress: Not on file  Relationships  . Social connections:    Talks on phone: Not on file    Gets together: Not on file    Attends religious service: Not on file    Active member of club or organization: Not on file    Attends meetings of clubs or organizations: Not on file    Relationship status: Not on file  Other Topics Concern  . Not on file  Social History Narrative  . Not on file   Family History  Problem Relation Age of Onset  . Heart attack Unknown    Scheduled Meds: . latanoprost  1 drop Both Eyes QHS   Continuous Infusions: . levETIRAcetam Stopped (01/02/18 0409)  . morphine     PRN Meds:.acetaminophen **OR** acetaminophen, glycopyrrolate, haloperidol  **OR** haloperidol **OR** haloperidol lactate, LORazepam **OR** LORazepam **OR** LORazepam, morphine Medications Prior to Admission:  Prior to Admission medications   Medication Sig Start Date End Date Taking? Authorizing Provider  acetaminophen (TYLENOL) 500 MG tablet Take 500 mg by mouth every 6 (six) hours as needed for moderate pain.   Yes [provider]  aspirin EC 81 MG tablet Take 81 mg by mouth daily.   Yes [provider]  feeding supplement, ENSURE ENLIVE, (ENSURE ENLIVE) LIQD Take 237 mLs by mouth daily at 3 pm. Patient taking differently: Take 237 mLs by mouth daily.  08/19/15  Yes Robbie Lis, MD  levothyroxine (SYNTHROID, LEVOTHROID) 25 MCG  tablet Take 25 mcg by mouth daily before breakfast. 10/08/15  Yes [provider]  Multiple Vitamin (MULTIVITAMIN WITH MINERALS) TABS tablet Take 1 tablet by mouth daily.   Yes [provider]  TRAVATAN Z 0.004 % SOLN ophthalmic solution Place 1 drop into both eyes at bedtime. 05/05/15  Yes [provider]  HYDROcodone-acetaminophen (NORCO) 10-325 MG tablet Take 1 tablet by mouth every 6 (six) hours as needed for moderate pain.  09/25/17   [provider]  polyethylene glycol (MIRALAX / GLYCOLAX) packet Take 17 g by mouth daily as needed. For constipation.    [provider]  senna (SENOKOT) 8.6 MG tablet Take 1-2 tablets by mouth daily as needed for constipation.    [provider]   Allergies  Allergen Reactions  . Ciprofloxacin Hives  . Sulfa Antibiotics Hives  . Tramadol Other (See Comments)    Altered Mental status   Review of Systems  Unable to perform ROS: Acuity of condition    Physical Exam  Constitutional:  frail  Cardiovascular:  irregular  Pulmonary/Chest: Effort normal.  Neurological:  nonresponsive to voice or light touch  Skin: Skin is warm and dry.  Nursing note and vitals reviewed.   Vital Signs: BP (!) 140/94   Pulse 66   Temp 98.3 F  (36.8 C) (Axillary)   Resp (!) 25   Wt 57.1 kg   SpO2 93%   BMI 18.06 kg/m  Pain Scale: PAINAD       SpO2: SpO2: 93 % O2 Device:SpO2: 93 % O2 Flow Rate: .O2 Flow Rate (L/min): 2 L/min  IO: Intake/output summary:   Intake/Output Summary (Last 24 hours) at 01/02/2018 1439 Last data filed at 01/02/2018 0900 Gross per 24 hour  Intake 246.02 ml  Output 1675 ml  Net -1428.98 ml    LBM: Last BM Date: 01/02/18 Baseline Weight: Weight: 57.1 kg Most recent weight: Weight: 57.1 kg     Palliative Assessment/Data: PPS: 10%     Thank you for this consult. Palliative medicine will continue to follow and assist as needed.   Time In: 1400 Time Out: 1515 Time Total: 75 minutes Greater than 50%  of this time was spent counseling and coordinating care related to the above assessment and plan.  Signed by: Mariana Kaufman, AGNP-C Palliative Medicine    Please contact Palliative Medicine Team phone at [phone] for questions and concerns.  For individual provider: See Shea Evans

## 2018-01-02 NOTE — Progress Notes (Signed)
[facility] Progress Note  Subjective: not responding verbally.    Vitals:   01/02/18 1200 01/02/18 1300 01/02/18 1400 01/02/18 1500  BP: (!) 142/100 (!) 140/94 (!) 152/81 117/82  Pulse: 69 66 (!) 43 (!) 41  Resp: 20 (!) 25 14 (!) 35  Temp: 98.3 F (36.8 C)     TempSrc: Axillary     SpO2: 96% 93% 94% 97%  Weight:        Inpatient medications: . latanoprost  1 drop Both Eyes QHS   . levETIRAcetam Stopped (01/02/18 0409)  . morphine 1 mg/hr (01/02/18 1515)   acetaminophen **OR** acetaminophen, glycopyrrolate, haloperidol **OR** haloperidol **OR** haloperidol lactate, LORazepam **OR** LORazepam **OR** LORazepam, morphine  Exam: General: 84 year old male somnolent, grimaced, eyes partially open, not following commands.  HEENT normocephalic atraumatic no jugular venous distention Pulmonary: Diminished bases no accessory use Cardiac: Regular irregular Abdomen: Soft nontender Extremities: Warm dry brisk cap refill no edema Neuro: Withdraws to pain nonverbal does not follow commands GU: Clear yellow  Assessment & Plan:   Left SDH with small left sided SAH and left occipital hemorrhagic contusion: after witnessed mechanical fall. Over the evening hours last night patient developed progression of hemorrhage and f/u CT head showed marked intraventricular hemorrhage with midline shift. Per discussions w/ family there was nothing that could be done and family agreed w/ medical /ICU team that he would be transitioned to comfort care.   - continue comfort care - d/w wife at bedside today - palliative care consult  A.fib - new onset. Plan No further anticoagulation DC telemetry  Acute hypoxic respiratory failure - felt to be due to acute pulmonary edema.   Plan Supplemental oxygen Comfort care No escalation  Hx hypothyroidism Plan Cont Synthroid IV  Best Practice:  Diet: NPO. DVT prophylaxis: SCD's. Mobility: Bedrest. Code Status: DNR.   Kelly Splinter  MD [facility] pgr [phone] 01/02/2018, 3:50 PM   Recent Labs  Lab 01/06/2018 1530 01/02/18 0420  NA 139 134*  K 3.8 3.8  CL 101 98  CO2 30 26  GLUCOSE 115* 193*  BUN 17 15  CREATININE 0.69 0.88  CALCIUM 8.7* 8.8*   Recent Labs  Lab 01/02/18 0420  AST 42*  ALT 32  ALKPHOS 66  BILITOT 0.9  PROT 6.6  ALBUMIN 3.5   Recent Labs  Lab 12/14/2017 1530 01/02/18 0420  WBC 6.9 10.4  HGB 13.1 13.1  HCT 40.5 40.1  MCV 103.8* 97.8  PLT 203 187   Iron/TIBC/Ferritin/ %Sat No results found for: IRON, TIBC, FERRITIN, IRONPCTSAT

## 2018-01-02 NOTE — Progress Notes (Signed)
Radiologist called CT result to this RN. MD made aware.

## 2018-01-02 NOTE — Progress Notes (Signed)
   NAME:  [name], MRN:  [id number], DOB:  [birth year], LOS: 1 ADMISSION DATE:  12/31/2017, CONSULTATION DATE:  12/31/2017 REFERRING MD:  [place] Pink  CHIEF COMPLAINT:  AMS   Brief History   [name] is a 84 y.o. male who was admitted 11/19 with a left SDH and small left SAH after a mechanical fall.  He was transferred from [facility] to [facility].   Past Medical History  CKD, hypothyroidism, dementia.  Significant Hospital Events   11/19 > admit. 11/20: clinically worse. Head CT showing extension of ICH and w/ new IVH w/ midline shift; given clinical decline will transition to comfort  Consults:  Neurosurgery. PCCM.  Procedures:  None.  Significant Diagnostic Tests:  CT head and C spine 11/19 > left occipital lobe hematoma, left SDH 48mm thickness, small SAH left temporal region, tiny posterior left parafalcine blood, chronic microvascular changes, global atrophy, left nasal bone fx.  No cervical spine fx.  Multilevel cervical spondylotic changes with various degrees of spinal stenosis and foraminal narrowing. CXR 11/19 > vascular congestion. R hip 11/19 > negative. CT head 11/20 > increased hemorrhage w/in left occipital lobe (from 5.6 to 7.6); new IVH and increased SAH, small mass effect Echo 11/20 >   Micro Data:  None.  Antimicrobials:  None.   Interim history/subjective:  Somnolent.  Comfortable.  Objective:  Blood pressure 112/78, pulse (Abnormal) 56, temperature 97.8 F (36.6 C), temperature source Oral, resp. rate 18, weight 57.1 kg, SpO2 94 %.        Intake/Output Summary (Last 24 hours) at 01/02/2018 0910 Last data filed at 01/02/2018 0600 Gross per 24 hour  Intake no documentation  Output 1500 ml  Net -1500 ml   Filed Weights   12/22/2017 2030  Weight: 57.1 kg   Examination:  General: 84 year old male currently lethargic and minimally responsive he does respond any withdrawal to pain only  HEENT normocephalic atraumatic no jugular venous  distention Pulmonary: Diminished bases no accessory use Cardiac: Regular irregular Abdomen: Soft nontender Extremities: Warm dry brisk cap refill no edema Neuro: Withdraws to pain nonverbal does not follow commands GU: Clear yellow  Assessment & Plan:   Left SDH with small left sided SAH and left occipital hemorrhagic contusion - after a witnessed mechanical fall.  -Over the evening hours has developed progression of hemorrhage and now has marked intraventricular hemorrhage with midline shift Plan Continue Keppra Comfort care Will meet with family Palliative care consult Move out of the intensive care  A.fib - new onset. Plan No further anticoagulation DC telemetry  Acute hypoxic respiratory failure - felt to be due to acute pulmonary edema.   Plan Supplemental oxygen Comfort care No escalation  Hx hypothyroidism Plan Cont Synthroid IV  Best Practice:  Diet: NPO. Pain/Anxiety/Delirium protocol (if indicated): N/A. VAP protocol (if indicated): N/A. DVT prophylaxis: SCD's. GI prophylaxis: N/A. Glucose control: N/A. Mobility: Bedrest. Code Status: DNR.  Per family discussion should things decline we will transition to comfort.  He has absolutely declined with worsening hemorrhage, now intraventricular hemorrhage, as well as midline shift.  This is not survivable.  We will transition to comfort care and update the family.  Will move to MedSurg bed  Critical care time: na     Erick Colace ACNP-BC [place] Pager # [phone] OR # [phone] if no answer

## 2018-01-03 DIAGNOSIS — N401 Enlarged prostate with lower urinary tract symptoms: Secondary | ICD-10-CM

## 2018-01-03 DIAGNOSIS — R338 Other retention of urine: Secondary | ICD-10-CM

## 2018-01-03 DIAGNOSIS — W19XXXD Unspecified fall, subsequent encounter: Secondary | ICD-10-CM

## 2018-01-03 DIAGNOSIS — J9601 Acute respiratory failure with hypoxia: Secondary | ICD-10-CM

## 2018-01-03 NOTE — Progress Notes (Signed)
Daily Progress Note   Patient Name: [name]       Date: 01/03/2018 DOB: 1925-08-25  Age: 84 y.o. MRN#: [id number] Attending Physician: Mendel Corning, MD Primary Care Physician: Lavone Orn, MD Admit Date: 12/18/2017  Reason for Consultation/Follow-up: Establishing goals of care  Subjective: Patient appears comfortable with morphine drip at rate of 3. No boluses or other prn medications given.  Spoke with spouse Hinton Dyer at bedside. She is grateful for the care patient is receiving.  Noted Dr. Tana Coast has requested residential Hospice for patient. Hinton Dyer is going to discuss this with patient's children. I discussed with Hinton Dyer that patient appears stable to survive a transfer to residential Hospice if that is her preference.   Review of Systems  Unable to perform ROS: Acuity of condition    Length of Stay: 2  Current Medications: Scheduled Meds:  . latanoprost  1 drop Both Eyes QHS    Continuous Infusions: . levETIRAcetam Stopped (01/03/18 0513)  . morphine 3 mg/hr (01/03/18 0900)    PRN Meds: acetaminophen **OR** acetaminophen, glycopyrrolate, haloperidol **OR** haloperidol **OR** haloperidol lactate, LORazepam **OR** LORazepam **OR** LORazepam, morphine  Physical Exam  Constitutional:  Frail, cachetic  Cardiovascular:  irregular  Pulmonary/Chest: Effort normal. No respiratory distress.  Neurological:  nonresponsive to voice or touch  Nursing note and vitals reviewed.           Vital Signs: BP 122/85   Pulse 71   Temp 98.3 F (36.8 C) (Axillary)   Resp (!) 21   Wt 57.1 kg   SpO2 92%   BMI 18.06 kg/m  SpO2: SpO2: 92 % O2 Device: O2 Device: Nasal Cannula O2 Flow Rate: O2 Flow Rate (L/min): 1 L/min  Intake/output summary:   Intake/Output Summary (Last 24  hours) at 01/03/2018 1400 Last data filed at 01/03/2018 0900 Gross per 24 hour  Intake 149.4 ml  Output 340 ml  Net -190.6 ml   LBM: Last BM Date: 01/02/18 Baseline Weight: Weight: 57.1 kg Most recent weight: Weight: 57.1 kg       Palliative Assessment/Data: PPS: 10%      Patient Active Problem List   Diagnosis Date Noted  . Goals of care, counseling/discussion   . Advanced care planning/counseling discussion   . Palliative care by specialist   . Terminal care   .  SDH (subdural hematoma) ([place]) 12/29/2017  . Hypothyroidism 12/16/2017  . Fall 12/26/2017  . New onset a-fib ([place]) 01/06/2018  . Acute respiratory failure with hypoxia ([place]) 01/02/2018  . Subdural hematoma ([place]) 01/09/2018  . SAH (subarachnoid hemorrhage) ([place])   . Benign prostatic hyperplasia with urinary retention 11/05/2015  . Constipation 08/15/2015  . Urinary retention 08/15/2015    Palliative Care Assessment & Plan   Patient Profile:84 y.o. male  with past medical history of dementia, arthritis, left detached retina, enlarged prostate admitted on 12/31/2017 with fall while walking from his car to his home. Initial CT scan showed left occipital lobe hematoma, L convexity subdural hematomy, and small amount of subarachnoid blood in the left temporal region, no surgical intervention was recommended by neurosurgery. Additionally, found to be in new onset a fib with hypoxia requiring 2L O2. Plan was made to treat medically and transition to comfort if patient declined further. This morning patient was clinically worse and repeat CT scan showed extension of ICH with new intraventricular hemorrhage with midline shift. Patient was transitioned to comfort measures and palliative medicine was consulted for assistance with end of life care.      Assessment/Recommendations/Plan   Continue comfort measures  Referal for residential hospice placed by Dr. Tana Coast  Goals of Care and Additional Recommendations:  Limitations  on Scope of Treatment: Full Comfort Care  Code Status:  DNR  Prognosis:   Hours - Days  Discharge Planning:  To Be Determined hospital death vs residential hospice  Care plan was discussed with patient's spouse- Hinton Dyer.  Thank you for allowing the Palliative Medicine Team to assist in the care of this patient.   Time In: 0900 Time Out: 0945 Total Time 45 minutes Prolonged Time Billed no      Greater than 50%  of this time was spent counseling and coordinating care related to the above assessment and plan.  Mariana Kaufman, AGNP-C Palliative Medicine   Please contact Palliative Medicine Team phone at [phone] for questions and concerns.

## 2018-01-03 NOTE — Progress Notes (Signed)
Pt transferred from [place], Palliative, alert and responsive, can move his left arm, with foley cath, had bowel movement today,peripheral IV on Morphine drip at 2 mg, 02nc at 2 liters, right forehead abrasion noted no bleeding, will continue to monitor.

## 2018-01-03 NOTE — Consult Note (Signed)
            [facility] CM Primary Care Navigator  01/03/2018  [name] 09-14-1925 [id number]   Went to see patient at the bedside to identify possible discharge needs but noted MD note requesting Inpatient social worker for residential hospice for patient. Per family's wishes, patient was placed on comfort care status.  Palliative care was consulted for assistance with end of life care. DNR status confirmed. Goals of care were discussed with patient's family. Discharge disposition to be determined hospital death vs. residential hospice Patient will possibly transition to a residential hospice facility at discharge.  Noted no further [facility] care management needs identified at this point.   For additional questions please contact:  Edwena Felty A. Airyana Sprunger, BSN, RN-BC [facility] PRIMARY CARE Navigator Cell: [phone]

## 2018-01-03 NOTE — Progress Notes (Signed)
[facility]                                                                              Patient Demographics  [name], is a 84 y.o. male, DOB - 09-07-1925, FOY:[id number]  Admit date - 12/24/2017   Admitting Physician Kipp Brood, MD  Outpatient Primary MD for the patient is Lavone Orn, MD  Outpatient specialists:   LOS - 2  days   Medical records reviewed and are as summarized below:    Chief Complaint  Patient presents with  . Fall       Brief summary   Patient is a 84 year old male with advanced dementia, does not follow commands at baseline, BPH status post TURP, acute on chronic kidney disease, hypothyroidism presented to ED via EMS after found down by his wife by car status post mechanical fall and injuring right side of his head.  CT head showed large occipital hematoma with surrounding vasogenic edema, small amount of subarachnoid hemorrhage.  Patient was admitted to ICU. Patient was transferred to hospitalist service on 01/03/2018  Assessment & Plan    Principal Problem:   SDH (subdural hematoma) (HCC), subarachnoid hemorrhage status post mechanical fall-  -Initial CT head on admission showed left occipital lobe hematoma 5.6x 2.4x 3.8 cm with surrounding vasogenic edema, subdural hematoma measuring up to 4 mm thickness, small amount of subarachnoid blood left temporal region and occipital region. -Neurosurgery was consulted and recommended medical management given his age, comorbidities and advanced dementia. -Patient was admitted to neuro ICU, started on neurochecks and Keppra, aspirin was discontinued -Repeat CT head showed increasing hemorrhage, now 7.6x 4.1x 4.9 cm with mass-effect, new intraventricular hemorrhage. palliative medicine consult was obtained, goals of care were discussed with patient's family -Per family's wishes, patient was placed on comfort care status, morphine drip -Discussed with patient son at the bedside, will  transfer to palliative floor, social work consulted for residential hospice  Active Problems:   Benign prostatic hyperplasia with urinary retention -Now on comfort care status    Hypothyroidism -Comfort care status    New onset a-fib ([place]) -Patient was found to have new onset atrial fibrillation and was initially placed on Cardizem, now comfort care status    Acute respiratory failure with hypoxia ([place]) -Felt secondary to acute pulmonary edema, received 1 dose of Lasix -No further work-up as patient is on complete comfort care status   Code Status: DNR/DNI DVT Prophylaxis: Comfort care Family Communication: Discussed in detail with the patient, all imaging results, lab results explained to the patient's son at the bedside  Disposition Plan: Awaiting residential hospice.  Transfer to palliative floor until bed available at residential hospice  Time Spent in minutes   40 minutes  Procedures:  CT head x2  Consultants:    Neurosurgery Patient was admitted by CCM  Antimicrobials:      Medications  Scheduled Meds: . latanoprost  1 drop Both Eyes QHS   Continuous Infusions: . levETIRAcetam Stopped (01/03/18 0513)  . morphine 3 mg/hr (01/03/18 0900)   PRN Meds:.acetaminophen **OR** acetaminophen, glycopyrrolate, haloperidol **OR** haloperidol **OR** haloperidol lactate, LORazepam **OR** LORazepam **OR** LORazepam, morphine  Antibiotics   Anti-infectives (From admission, onward)   None        Subjective:   [name] was seen and examined today.  Currently on morphine drip, minimally responsive  Objective:   Vitals:   01/03/18 0000 01/03/18 0400 01/03/18 0700 01/03/18 0800  BP:      Pulse: 95 90 (!) 108 71  Resp: 19 (!) 21 18 (!) 21  Temp:      TempSrc:      SpO2: 95% 98% (!) 89% 92%  Weight:        Intake/Output Summary (Last 24 hours) at 01/03/2018 1040 Last data filed at 01/03/2018 0900 Gross per 24 hour  Intake 149.4 ml  Output 440 ml    Net -290.6 ml     Wt Readings from Last 3 Encounters:  01/11/2018 57.1 kg  11/05/15 59.4 kg  11/02/15 59.4 kg     Exam : discussed with patient son at the bedside, he did not feel the need for examination  General: Somnolent, still appears to be comfortable and sleeping   Eyes:   HEENT: Bruising to the right scalp  Cardiovascular:   Respiratory:   Gastrointestinal:   Ext: No edema  Neuro: Somnolent, appears to be comfortable  Musculoskeletal:   Skin: No rashes  Psych: Minimally responsive   Data Reviewed:  I have personally reviewed following labs and imaging studies  Micro Results Recent Results (from the past 240 hour(s))  MRSA PCR Screening     Status: None   Collection Time: 01/04/2018  8:33 PM  Result Value Ref Range Status   MRSA by PCR NEGATIVE NEGATIVE Final    Comment:        The GeneXpert MRSA Assay (FDA approved for NASAL specimens only), is one component of a comprehensive MRSA colonization surveillance program. It is not intended to diagnose MRSA infection nor to guide or monitor treatment for MRSA infections. Performed at [facility], [place] [address], [place],  [address]     Radiology Reports Dg Chest 1 View  Result Date: 01/06/2018 CLINICAL DATA:  Fall with pain EXAM: CHEST  1 VIEW COMPARISON:  08/29/2017 FINDINGS: Chronic cardiomegaly.  Aortic tortuosity. Large lung volumes and interstitial coarsening. No consolidation, effusion, or pneumothorax. No acute osseous finding as permitted by osteopenia and rotation. IMPRESSION: Cardiomegaly and borderline vascular congestion. Electronically Signed   By: Monte Fantasia M.D.   On: 12/16/2017 15:25   Ct Head Wo Contrast  Result Date: 01/02/2018 CLINICAL DATA:  84 y/o  M; intracranial hemorrhage for follow-up. EXAM: CT HEAD WITHOUT CONTRAST TECHNIQUE: Contiguous axial images were obtained from the base of the skull through the vertex without intravenous contrast. COMPARISON:   12/24/2017 CT head. FINDINGS: Brain: Increase size of hemorrhage within the left posterior temporal and occipital lobes measuring 7.6 x 4.1 x 4.9 cm (volume = 80 cm^3). Interval partial decompression of the hematoma into the ventricular system with pooling of blood products in the occipital horns of the lateral ventricles. Increased small volume subarachnoid hemorrhage predominantly over the left convexity. Previously identified subdural hematoma over the left cerebral convexity is poorly visualized, probably due to mass effect from the adjacent brain. Mass effect results in 4 mm of left-to-right midline shift and partial effacement of the left lateral ventricle. No herniation at this time. Stable background of chronic microvascular ischemic changes and volume loss of the brain. Vascular: No hyperdense vessel or unexpected calcification. Skull: Normal. Negative for fracture or focal lesion. Sinuses/Orbits: Stable mucosal  thickening of left frontal sinus and partial opacification of ethmoid air cells. Orbits are unremarkable. Other: None. IMPRESSION: 1. Increased hemorrhage within the left occipitotemporal lobe measuring up to 7.6 cm, 80 cc. Previously up to 5.6 cm. 2. New intraventricular hemorrhage dependent lateral ventricles. 3. Increased small volume subarachnoid hemorrhage over the left convexity. 4. Previously identified thin left-sided subdural hematoma is poorly visualized, probably due to mass effect from the brain parenchymal hemorrhage. 5. Mass effect results in 4 mm of left-to-right midline shift and partial effacement of the left lateral ventricle. No herniation at this time. These results will be called to the ordering clinician or representative by the Radiologist Assistant, and communication documented in the PACS or zVision Dashboard. Electronically Signed   By: Kristine Garbe M.D.   On: 01/02/2018 06:04   Ct Head Wo Contrast  Result Date: 12/27/2017 CLINICAL DATA:  84 year old male  post fall.  Initial encounter. EXAM: CT HEAD WITHOUT CONTRAST CT CERVICAL SPINE WITHOUT CONTRAST TECHNIQUE: Multidetector CT imaging of the head and cervical spine was performed following the standard protocol without intravenous contrast. Multiplanar CT image reconstructions of the cervical spine were also generated. COMPARISON:  08/21/2015. FINDINGS: CT HEAD FINDINGS Brain: Left occipital lobe hematoma spanning over 5.6 x 2.4 x 3.8 cm with surrounding vasogenic edema. Broad-based left convexity subdural hematoma measuring up to 4 mm maximal thickness. Small amount of subarachnoid blood left temporal region and occipital region. Tiny posterior left parafalcine blood may be subarachnoid or subdural in location. Question artifact versus tiny amount of blood along the periphery of the right temporal lobe. Chronic microvascular changes. Global atrophy. No intracranial mass lesion noted on this unenhanced exam. Vascular: Vascular calcifications Skull: No skull fracture. Left nasal bone fracture of questionable age. Sinuses/Orbits: Post lens replacement. Globes appear to be grossly intact. Mucosal thickening/opacification left frontal sinus without surrounding fracture. Opacification ethmoid sinus air cells bilaterally. Polypoid opacification anterior sphenoid sinus. Other: Mastoid air cells and middle ear cavities are clear. CT CERVICAL SPINE FINDINGS Alignment: Curvature cervical spine. Skull base and vertebrae: No cervical spine fracture noted. Minimal interval loss of height T1 vertebral body predominantly involving the inferior endplate suggestive of small Schmorl's node deformity of indeterminate age. Soft tissues and spinal canal: No abnormal prevertebral soft tissue swelling. Disc levels: Multilevel cervical spondylotic changes with various degrees of spinal stenosis and foraminal narrowing. Upper chest: Bony overgrowth sternoclavicular region incompletely assessed. This was noted previously. Other: No worrisome  neck mass. IMPRESSION: CT HEAD: 1. Left occipital lobe hematoma spanning over 5.6 x 2.4 x 3.8 cm with surrounding vasogenic edema. 2. Broad-based left convexity subdural hematoma measuring up to 4 mm maximal thickness. 3. Small amount of subarachnoid blood left temporal region and occipital region. 4. Tiny posterior left parafalcine blood may be subarachnoid or subdural in location. 5. Question artifact versus tiny amount of blood along the periphery of the right temporal lobe. 6. Chronic microvascular changes. Global atrophy. 7. Left nasal bone fracture of questionable age. 8. Mucosal thickening/opacification left frontal sinus without surrounding fracture. Opacification ethmoid sinus air cells bilaterally. Polypoid opacification anterior sphenoid sinus. CT CERVICAL SPINE: 1. Curvature cervical spine. No cervical spine fracture or abnormal prevertebral soft tissue swelling noted. 2. Minimal interval loss of height T1 vertebral body predominantly involving the inferior endplate suggestive of small Schmorl's node deformity of indeterminate age. 3. Multilevel cervical spondylotic changes with various degrees of spinal stenosis and foraminal narrowing. These results were called by telephone at the time of interpretation on 12/28/2017 at 3:14  pm to Dr. Jola Schmidt , who verbally acknowledged these results. Electronically Signed   By: Genia Del M.D.   On: 01/07/2018 15:41   Ct Cervical Spine Wo Contrast  Result Date: 01/02/2018 CLINICAL DATA:  84 year old male post fall.  Initial encounter. EXAM: CT HEAD WITHOUT CONTRAST CT CERVICAL SPINE WITHOUT CONTRAST TECHNIQUE: Multidetector CT imaging of the head and cervical spine was performed following the standard protocol without intravenous contrast. Multiplanar CT image reconstructions of the cervical spine were also generated. COMPARISON:  08/21/2015. FINDINGS: CT HEAD FINDINGS Brain: Left occipital lobe hematoma spanning over 5.6 x 2.4 x 3.8 cm with surrounding  vasogenic edema. Broad-based left convexity subdural hematoma measuring up to 4 mm maximal thickness. Small amount of subarachnoid blood left temporal region and occipital region. Tiny posterior left parafalcine blood may be subarachnoid or subdural in location. Question artifact versus tiny amount of blood along the periphery of the right temporal lobe. Chronic microvascular changes. Global atrophy. No intracranial mass lesion noted on this unenhanced exam. Vascular: Vascular calcifications Skull: No skull fracture. Left nasal bone fracture of questionable age. Sinuses/Orbits: Post lens replacement. Globes appear to be grossly intact. Mucosal thickening/opacification left frontal sinus without surrounding fracture. Opacification ethmoid sinus air cells bilaterally. Polypoid opacification anterior sphenoid sinus. Other: Mastoid air cells and middle ear cavities are clear. CT CERVICAL SPINE FINDINGS Alignment: Curvature cervical spine. Skull base and vertebrae: No cervical spine fracture noted. Minimal interval loss of height T1 vertebral body predominantly involving the inferior endplate suggestive of small Schmorl's node deformity of indeterminate age. Soft tissues and spinal canal: No abnormal prevertebral soft tissue swelling. Disc levels: Multilevel cervical spondylotic changes with various degrees of spinal stenosis and foraminal narrowing. Upper chest: Bony overgrowth sternoclavicular region incompletely assessed. This was noted previously. Other: No worrisome neck mass. IMPRESSION: CT HEAD: 1. Left occipital lobe hematoma spanning over 5.6 x 2.4 x 3.8 cm with surrounding vasogenic edema. 2. Broad-based left convexity subdural hematoma measuring up to 4 mm maximal thickness. 3. Small amount of subarachnoid blood left temporal region and occipital region. 4. Tiny posterior left parafalcine blood may be subarachnoid or subdural in location. 5. Question artifact versus tiny amount of blood along the periphery of  the right temporal lobe. 6. Chronic microvascular changes. Global atrophy. 7. Left nasal bone fracture of questionable age. 8. Mucosal thickening/opacification left frontal sinus without surrounding fracture. Opacification ethmoid sinus air cells bilaterally. Polypoid opacification anterior sphenoid sinus. CT CERVICAL SPINE: 1. Curvature cervical spine. No cervical spine fracture or abnormal prevertebral soft tissue swelling noted. 2. Minimal interval loss of height T1 vertebral body predominantly involving the inferior endplate suggestive of small Schmorl's node deformity of indeterminate age. 3. Multilevel cervical spondylotic changes with various degrees of spinal stenosis and foraminal narrowing. These results were called by telephone at the time of interpretation on 12/19/2017 at 3:14 pm to Dr. Jola Schmidt , who verbally acknowledged these results. Electronically Signed   By: Genia Del M.D.   On: 12/22/2017 15:41   Dg Hip Unilat W Or Wo Pelvis 2-3 Views Right  Result Date: 12/15/2017 CLINICAL DATA:  Right hip pain after fall. EXAM: DG HIP (WITH OR WITHOUT PELVIS) 2-3V RIGHT COMPARISON:  None. FINDINGS: There is no evidence of hip fracture or dislocation. There is no evidence of arthropathy or other focal bone abnormality. IMPRESSION: Negative. Electronically Signed   By: Marijo Conception, M.D.   On: 12/14/2017 15:26    Lab Data:  CBC: Recent Labs  Lab 12/29/2017  1530 01/02/18 0420  WBC 6.9 10.4  HGB 13.1 13.1  HCT 40.5 40.1  MCV 103.8* 97.8  PLT 203 280   Basic Metabolic Panel: Recent Labs  Lab 12/31/2017 1530 01/02/18 0420  NA 139 134*  K 3.8 3.8  CL 101 98  CO2 30 26  GLUCOSE 115* 193*  BUN 17 15  CREATININE 0.69 0.88  CALCIUM 8.7* 8.8*   GFR: CrCl cannot be calculated (Unknown ideal weight.). Liver Function Tests: Recent Labs  Lab 01/02/18 0420  AST 42*  ALT 32  ALKPHOS 66  BILITOT 0.9  PROT 6.6  ALBUMIN 3.5   No results for input(s): LIPASE, AMYLASE in the  last 168 hours. No results for input(s): AMMONIA in the last 168 hours. Coagulation Profile: Recent Labs  Lab 12/31/2017 1530  INR 1.04   Cardiac Enzymes: No results for input(s): CKTOTAL, CKMB, CKMBINDEX, TROPONINI in the last 168 hours. BNP (last 3 results) No results for input(s): PROBNP in the last 8760 hours. HbA1C: No results for input(s): HGBA1C in the last 72 hours. CBG: No results for input(s): GLUCAP in the last 168 hours. Lipid Profile: No results for input(s): CHOL, HDL, LDLCALC, TRIG, CHOLHDL, LDLDIRECT in the last 72 hours. Thyroid Function Tests: No results for input(s): TSH, T4TOTAL, FREET4, T3FREE, THYROIDAB in the last 72 hours. Anemia Panel: No results for input(s): VITAMINB12, FOLATE, FERRITIN, TIBC, IRON, RETICCTPCT in the last 72 hours. Urine analysis:    Component Value Date/Time   COLORURINE AMBER (A) 08/21/2015 0421   APPEARANCEUR CLEAR 08/21/2015 0421   LABSPEC 1.024 08/21/2015 0421   PHURINE 6.5 08/21/2015 0421   GLUCOSEU NEGATIVE 08/21/2015 0421   HGBUR NEGATIVE 08/21/2015 0421   BILIRUBINUR NEGATIVE 08/21/2015 0421   KETONESUR NEGATIVE 08/21/2015 0421   PROTEINUR NEGATIVE 08/21/2015 0421   UROBILINOGEN 0.2 08/03/2007 0928   NITRITE NEGATIVE 08/21/2015 0421   LEUKOCYTESUR NEGATIVE 08/21/2015 0421     Ripudeep Rai M.D. [facility] 01/03/2018, 10:40 AM  Pager: [phone] Between 7am to 7pm - call Pager - [phone]  After 7pm go to [email or web address] - password [email or web address]  Call night coverage person covering after 7pm

## 2018-01-04 DIAGNOSIS — I615 Nontraumatic intracerebral hemorrhage, intraventricular: Secondary | ICD-10-CM

## 2018-01-04 NOTE — Social Work (Signed)
CSW acknowledging consult for transfer/referral to a residential Hospice Home. Per PMT notes pt wife is discussing this with their children. Will follow up for preference for hospice home.  Westley Hummer, MSW, [place] [facility] [phone]

## 2018-01-04 NOTE — Progress Notes (Signed)
Nutrition Brief Note  Chart reviewed. Pt now transitioning to comfort care.  No further nutrition interventions warranted at this time.  Please re-consult as needed.   Shuna Tabor A. Anecia Nusbaum, RD, LDN, CDE Pager: [phone] After hours Pager: [phone]  

## 2018-01-04 NOTE — Progress Notes (Signed)
Daily Progress Note   Patient Name: [name]       Date: 01/04/2018 DOB: 01/04/1926  Age: 84 y.o. MRN#: [id number] Attending Physician: Mendel Corning, MD Primary Care Physician: Lavone Orn, MD Admit Date: 12/23/2017  Reason for Consultation/Follow-up: Establishing goals of care  Subjective: Patient in bed. Does not respond to voice or touch. Per wife he was awake today and appeared comfortable when awake. He has had very little urine output today. Noted that his RR was increased >25, last O2 sat was 68%, he felt tachycardic to me. Requested RN administer morphine bolus. [facility] Dyer has spoken to patient's children and they do not wish to transfer patient to residential hospice. I do not think at this point he is stable for transfer.   ROS  Length of Stay: 3  Current Medications: Scheduled Meds:  . latanoprost  1 drop Both Eyes QHS    Continuous Infusions: . levETIRAcetam 500 mg (01/04/18 0327)  . morphine 2 mg/hr (01/03/18 2012)    PRN Meds: acetaminophen **OR** acetaminophen, glycopyrrolate, haloperidol **OR** haloperidol **OR** haloperidol lactate, LORazepam **OR** LORazepam **OR** LORazepam, morphine  Physical Exam          Vital Signs: BP 123/88 (BP Location: Left Arm)   Pulse (!) 110   Temp 97.6 F (36.4 C) (Oral)   Resp 18   Wt 57.1 kg   SpO2 (!) 68%   BMI 18.06 kg/m  SpO2: SpO2: (!) 68 % O2 Device: O2 Device: Nasal Cannula O2 Flow Rate: O2 Flow Rate (L/min): 2 L/min  Intake/output summary:   Intake/Output Summary (Last 24 hours) at 01/04/2018 1327 Last data filed at 01/04/2018 0550 Gross per 24 hour  Intake 30.1 ml  Output 625 ml  Net -594.9 ml   LBM: Last BM Date: 01/03/18 Baseline Weight: Weight: 57.1 kg Most recent weight: Weight: 57.1 kg    Palliative Assessment/Data: PPS: 10%      Patient Active Problem List   Diagnosis Date Noted  . Goals of care, counseling/discussion   . Advanced care planning/counseling discussion   . Palliative care by specialist   . Terminal care   . SDH (subdural hematoma) ([place]) 01/09/2018  . Hypothyroidism 12/21/2017  . Fall 01/09/2018  . New onset a-fib ([place]) 12/24/2017  . Acute respiratory failure with hypoxia ([place]) 12/21/2017  . Subdural  hematoma ([place]) 01/10/2018  . SAH (subarachnoid hemorrhage) ([place])   . Benign prostatic hyperplasia with urinary retention 11/05/2015  . Constipation 08/15/2015  . Urinary retention 08/15/2015    Palliative Care Assessment & Plan   Patient Profile:  84 y.o. male  with past medical history of dementia, arthritis, left detached retina, enlarged prostate admitted on 12/30/2017 with fall while walking from his car to his home. Initial CT scan showed left occipital lobe hematoma, L convexity subdural hematomy, and small amount of subarachnoid blood in the left temporal region, no surgical intervention was recommended by neurosurgery. Additionally, found to be in new onset a fib with hypoxia requiring 2L O2. Plan was made to treat medically and transition to comfort if patient declined further. This morning patient was clinically worse and repeat CT scan showed extension of ICH with new intraventricular hemorrhage with midline shift. Patient was transitioned to comfort measures and palliative medicine was consulted for assistance with end of life care.     Assessment/Recommendations/Plan   Continue comfort measures  Patient's family do not wish to discharge patient to residential hospice- patient does not appear stable for discharge at this point with increased RR and O2 sat at 68%.  Anticipate hospital death  Goals of Care and Additional Recommendations:  Limitations on Scope of Treatment: Full Comfort Care  Code Status:  DNR  Prognosis:   Hours -  Days  Discharge Planning:  Anticipated Hospital Death  Care plan was discussed with patient's RN and patient's spouse.   Thank you for allowing the Palliative Medicine Team to assist in the care of this patient.   Time In: 1255 Time Out: 1330 Total Time 25 minutes Prolonged Time Billed no      Greater than 50%  of this time was spent counseling and coordinating care related to the above assessment and plan.  Mariana Kaufman, AGNP-C Palliative Medicine   Please contact Palliative Medicine Team phone at [phone] for questions and concerns.

## 2018-01-04 NOTE — Progress Notes (Signed)
[facility]                                                                              Patient Demographics  [name], is a 84 y.o. male, DOB - January 11, 1926, LDJ:[id number]  Admit date - 01/03/2018   Admitting Physician Kipp Brood, MD  Outpatient Primary MD for the patient is Lavone Orn, MD  Outpatient specialists:   LOS - 3  days   Medical records reviewed and are as summarized below:    Chief Complaint  Patient presents with  . Fall       Brief summary   Patient is a 84 year old male with advanced dementia, does not follow commands at baseline, BPH status post TURP, acute on chronic kidney disease, hypothyroidism presented to ED via EMS after found down by his wife by car status post mechanical fall and injuring right side of his head.  CT head showed large occipital hematoma with surrounding vasogenic edema, small amount of subarachnoid hemorrhage.  Patient was admitted to ICU. Patient was transferred to hospitalist service on 01/03/2018  Assessment & Plan    Principal Problem:   SDH (subdural hematoma) (HCC), subarachnoid hemorrhage status post mechanical fall-  -Initial CT head on admission showed left occipital lobe hematoma 5.6x 2.4x 3.8 cm with surrounding vasogenic edema, subdural hematoma measuring up to 4 mm thickness, small amount of subarachnoid blood left temporal region and occipital region. -Neurosurgery was consulted and recommended medical management given his age, comorbidities and advanced dementia. Patient was admitted to neuro ICU, started on neurochecks and Keppra, aspirin was discontinued -Repeat CT head showed increasing hemorrhage, now 7.6x 4.1x 4.9 cm with mass-effect, new intraventricular hemorrhage. palliative medicine consult was obtained, goals of care were discussed with patient's family -Per family's wishes, patient was placed on comfort care status, morphine drip -Anticipate hospital death, prognosis hours to  days  Active Problems:   Benign prostatic hyperplasia with urinary retention -Now on comfort care status    Hypothyroidism -Comfort care status    New onset a-fib ([place]) -Patient was found to have new onset atrial fibrillation and was initially placed on Cardizem, now comfort care status    Acute respiratory failure with hypoxia ([place]) -Felt secondary to acute pulmonary edema, received 1 dose of Lasix -No further work-up as patient is on complete comfort care status   Code Status: DNR/DNI DVT Prophylaxis: Comfort care Family Communication: Discussed in detail with the patient, all imaging results, lab results explained to the patient's son yesterday on 11/21  Disposition Plan: Anticipated hospital death  Time Spent in minutes   40 minutes  Procedures:  CT head x2  Consultants:    Neurosurgery Patient was admitted by CCM  Antimicrobials:      Medications  Scheduled Meds: . latanoprost  1 drop Both Eyes QHS   Continuous Infusions: . levETIRAcetam 500 mg (01/04/18 0327)  . morphine 2 mg/hr (01/03/18 2012)   PRN Meds:.acetaminophen **OR** acetaminophen, glycopyrrolate, haloperidol **OR** haloperidol **OR** haloperidol lactate, LORazepam **OR** LORazepam **OR** LORazepam, morphine   Antibiotics   Anti-infectives (From admission, onward)   None        Subjective:  [name] was seen and examined today.  Currently on morphine drip, minimally responsive, shallow breathing.  Objective:   Vitals:   01/03/18 0800 01/03/18 1200 01/03/18 1400 01/04/18 0522  BP:    123/88  Pulse: 71 88 60 (!) 110  Resp: (!) 21 18 (!) 24 18  Temp:    97.6 F (36.4 C)  TempSrc:    Oral  SpO2: 92% 95% 94% (!) 68%  Weight:        Intake/Output Summary (Last 24 hours) at 01/04/2018 1443 Last data filed at 01/04/2018 0550 Gross per 24 hour  Intake 27.11 ml  Output 625 ml  Net -597.89 ml     Wt Readings from Last 3 Encounters:  01/07/2018 57.1 kg  11/05/15 59.4 kg   11/02/15 59.4 kg     Physical Exam General: Minimally responsive, eyes open, shallow breathing Eyes: HEENT:   Cardiovascular: S1 S2 clear, RRR, tachycardia. Respiratory: Decreased breath sound at the bases Gastrointestinal: Ext: no pedal edema bilaterally Neuro: did not assess Musculoskeletal: No digital cyanosis, clubbing Skin: No rashes Psych: minimally responsive     Data Reviewed:  I have personally reviewed following labs and imaging studies  Micro Results Recent Results (from the past 240 hour(s))  MRSA PCR Screening     Status: None   Collection Time: 01/09/2018  8:33 PM  Result Value Ref Range Status   MRSA by PCR NEGATIVE NEGATIVE Final    Comment:        The GeneXpert MRSA Assay (FDA approved for NASAL specimens only), is one component of a comprehensive MRSA colonization surveillance program. It is not intended to diagnose MRSA infection nor to guide or monitor treatment for MRSA infections. Performed at [facility], [place] [address] [place], [place] [address]     Radiology Reports Dg Chest 1 View  Result Date: 12/28/2017 CLINICAL DATA:  Fall with pain EXAM: CHEST  1 VIEW COMPARISON:  08/29/2017 FINDINGS: Chronic cardiomegaly.  Aortic tortuosity. Large lung volumes and interstitial coarsening. No consolidation, effusion, or pneumothorax. No acute osseous finding as permitted by osteopenia and rotation. IMPRESSION: Cardiomegaly and borderline vascular congestion. Electronically Signed   By: Monte Fantasia M.D.   On: 12/24/2017 15:25   Ct Head Wo Contrast  Result Date: 01/02/2018 CLINICAL DATA:  84 y/o  M; intracranial hemorrhage for follow-up. EXAM: CT HEAD WITHOUT CONTRAST TECHNIQUE: Contiguous axial images were obtained from the base of the skull through the vertex without intravenous contrast. COMPARISON:  01/04/2018 CT head. FINDINGS: Brain: Increase size of hemorrhage within the left posterior temporal and occipital lobes measuring 7.6 x 4.1 x  4.9 cm (volume = 80 cm^3). Interval partial decompression of the hematoma into the ventricular system with pooling of blood products in the occipital horns of the lateral ventricles. Increased small volume subarachnoid hemorrhage predominantly over the left convexity. Previously identified subdural hematoma over the left cerebral convexity is poorly visualized, probably due to mass effect from the adjacent brain. Mass effect results in 4 mm of left-to-right midline shift and partial effacement of the left lateral ventricle. No herniation at this time. Stable background of chronic microvascular ischemic changes and volume loss of the brain. Vascular: No hyperdense vessel or unexpected calcification. Skull: Normal. Negative for fracture or focal lesion. Sinuses/Orbits: Stable mucosal thickening of left frontal sinus and partial opacification of ethmoid air cells. Orbits are unremarkable. Other: None. IMPRESSION: 1. Increased hemorrhage within the left occipitotemporal lobe measuring up to 7.6 cm, 80 cc. Previously up to  5.6 cm. 2. New intraventricular hemorrhage dependent lateral ventricles. 3. Increased small volume subarachnoid hemorrhage over the left convexity. 4. Previously identified thin left-sided subdural hematoma is poorly visualized, probably due to mass effect from the brain parenchymal hemorrhage. 5. Mass effect results in 4 mm of left-to-right midline shift and partial effacement of the left lateral ventricle. No herniation at this time. These results will be called to the ordering clinician or representative by the Radiologist Assistant, and communication documented in the PACS or zVision Dashboard. Electronically Signed   By: Kristine Garbe M.D.   On: 01/02/2018 06:04   Ct Head Wo Contrast  Result Date: 12/18/2017 CLINICAL DATA:  84 year old male post fall.  Initial encounter. EXAM: CT HEAD WITHOUT CONTRAST CT CERVICAL SPINE WITHOUT CONTRAST TECHNIQUE: Multidetector CT imaging of the  head and cervical spine was performed following the standard protocol without intravenous contrast. Multiplanar CT image reconstructions of the cervical spine were also generated. COMPARISON:  08/21/2015. FINDINGS: CT HEAD FINDINGS Brain: Left occipital lobe hematoma spanning over 5.6 x 2.4 x 3.8 cm with surrounding vasogenic edema. Broad-based left convexity subdural hematoma measuring up to 4 mm maximal thickness. Small amount of subarachnoid blood left temporal region and occipital region. Tiny posterior left parafalcine blood may be subarachnoid or subdural in location. Question artifact versus tiny amount of blood along the periphery of the right temporal lobe. Chronic microvascular changes. Global atrophy. No intracranial mass lesion noted on this unenhanced exam. Vascular: Vascular calcifications Skull: No skull fracture. Left nasal bone fracture of questionable age. Sinuses/Orbits: Post lens replacement. Globes appear to be grossly intact. Mucosal thickening/opacification left frontal sinus without surrounding fracture. Opacification ethmoid sinus air cells bilaterally. Polypoid opacification anterior sphenoid sinus. Other: Mastoid air cells and middle ear cavities are clear. CT CERVICAL SPINE FINDINGS Alignment: Curvature cervical spine. Skull base and vertebrae: No cervical spine fracture noted. Minimal interval loss of height T1 vertebral body predominantly involving the inferior endplate suggestive of small Schmorl's node deformity of indeterminate age. Soft tissues and spinal canal: No abnormal prevertebral soft tissue swelling. Disc levels: Multilevel cervical spondylotic changes with various degrees of spinal stenosis and foraminal narrowing. Upper chest: Bony overgrowth sternoclavicular region incompletely assessed. This was noted previously. Other: No worrisome neck mass. IMPRESSION: CT HEAD: 1. Left occipital lobe hematoma spanning over 5.6 x 2.4 x 3.8 cm with surrounding vasogenic edema. 2.  Broad-based left convexity subdural hematoma measuring up to 4 mm maximal thickness. 3. Small amount of subarachnoid blood left temporal region and occipital region. 4. Tiny posterior left parafalcine blood may be subarachnoid or subdural in location. 5. Question artifact versus tiny amount of blood along the periphery of the right temporal lobe. 6. Chronic microvascular changes. Global atrophy. 7. Left nasal bone fracture of questionable age. 8. Mucosal thickening/opacification left frontal sinus without surrounding fracture. Opacification ethmoid sinus air cells bilaterally. Polypoid opacification anterior sphenoid sinus. CT CERVICAL SPINE: 1. Curvature cervical spine. No cervical spine fracture or abnormal prevertebral soft tissue swelling noted. 2. Minimal interval loss of height T1 vertebral body predominantly involving the inferior endplate suggestive of small Schmorl's node deformity of indeterminate age. 3. Multilevel cervical spondylotic changes with various degrees of spinal stenosis and foraminal narrowing. These results were called by telephone at the time of interpretation on 12/30/2017 at 3:14 pm to Dr. Jola Schmidt , who verbally acknowledged these results. Electronically Signed   By: Genia Del M.D.   On: 01/08/2018 15:41   Ct Cervical Spine Wo Contrast  Result Date: 12/27/2017  CLINICAL DATA:  84 year old male post fall.  Initial encounter. EXAM: CT HEAD WITHOUT CONTRAST CT CERVICAL SPINE WITHOUT CONTRAST TECHNIQUE: Multidetector CT imaging of the head and cervical spine was performed following the standard protocol without intravenous contrast. Multiplanar CT image reconstructions of the cervical spine were also generated. COMPARISON:  08/21/2015. FINDINGS: CT HEAD FINDINGS Brain: Left occipital lobe hematoma spanning over 5.6 x 2.4 x 3.8 cm with surrounding vasogenic edema. Broad-based left convexity subdural hematoma measuring up to 4 mm maximal thickness. Small amount of subarachnoid  blood left temporal region and occipital region. Tiny posterior left parafalcine blood may be subarachnoid or subdural in location. Question artifact versus tiny amount of blood along the periphery of the right temporal lobe. Chronic microvascular changes. Global atrophy. No intracranial mass lesion noted on this unenhanced exam. Vascular: Vascular calcifications Skull: No skull fracture. Left nasal bone fracture of questionable age. Sinuses/Orbits: Post lens replacement. Globes appear to be grossly intact. Mucosal thickening/opacification left frontal sinus without surrounding fracture. Opacification ethmoid sinus air cells bilaterally. Polypoid opacification anterior sphenoid sinus. Other: Mastoid air cells and middle ear cavities are clear. CT CERVICAL SPINE FINDINGS Alignment: Curvature cervical spine. Skull base and vertebrae: No cervical spine fracture noted. Minimal interval loss of height T1 vertebral body predominantly involving the inferior endplate suggestive of small Schmorl's node deformity of indeterminate age. Soft tissues and spinal canal: No abnormal prevertebral soft tissue swelling. Disc levels: Multilevel cervical spondylotic changes with various degrees of spinal stenosis and foraminal narrowing. Upper chest: Bony overgrowth sternoclavicular region incompletely assessed. This was noted previously. Other: No worrisome neck mass. IMPRESSION: CT HEAD: 1. Left occipital lobe hematoma spanning over 5.6 x 2.4 x 3.8 cm with surrounding vasogenic edema. 2. Broad-based left convexity subdural hematoma measuring up to 4 mm maximal thickness. 3. Small amount of subarachnoid blood left temporal region and occipital region. 4. Tiny posterior left parafalcine blood may be subarachnoid or subdural in location. 5. Question artifact versus tiny amount of blood along the periphery of the right temporal lobe. 6. Chronic microvascular changes. Global atrophy. 7. Left nasal bone fracture of questionable age. 8.  Mucosal thickening/opacification left frontal sinus without surrounding fracture. Opacification ethmoid sinus air cells bilaterally. Polypoid opacification anterior sphenoid sinus. CT CERVICAL SPINE: 1. Curvature cervical spine. No cervical spine fracture or abnormal prevertebral soft tissue swelling noted. 2. Minimal interval loss of height T1 vertebral body predominantly involving the inferior endplate suggestive of small Schmorl's node deformity of indeterminate age. 3. Multilevel cervical spondylotic changes with various degrees of spinal stenosis and foraminal narrowing. These results were called by telephone at the time of interpretation on 12/21/2017 at 3:14 pm to Dr. Jola Schmidt , who verbally acknowledged these results. Electronically Signed   By: Genia Del M.D.   On: 01/09/2018 15:41   Dg Hip Unilat W Or Wo Pelvis 2-3 Views Right  Result Date: 01/04/2018 CLINICAL DATA:  Right hip pain after fall. EXAM: DG HIP (WITH OR WITHOUT PELVIS) 2-3V RIGHT COMPARISON:  None. FINDINGS: There is no evidence of hip fracture or dislocation. There is no evidence of arthropathy or other focal bone abnormality. IMPRESSION: Negative. Electronically Signed   By: Marijo Conception, M.D.   On: 12/21/2017 15:26    Lab Data:  CBC: Recent Labs  Lab 01/12/2018 1530 01/02/18 0420  WBC 6.9 10.4  HGB 13.1 13.1  HCT 40.5 40.1  MCV 103.8* 97.8  PLT 203 353   Basic Metabolic Panel: Recent Labs  Lab 12/30/2017 1530 01/02/18 0420  NA 139 134*  K 3.8 3.8  CL 101 98  CO2 30 26  GLUCOSE 115* 193*  BUN 17 15  CREATININE 0.69 0.88  CALCIUM 8.7* 8.8*   GFR: CrCl cannot be calculated (Unknown ideal weight.). Liver Function Tests: Recent Labs  Lab 01/02/18 0420  AST 42*  ALT 32  ALKPHOS 66  BILITOT 0.9  PROT 6.6  ALBUMIN 3.5   No results for input(s): LIPASE, AMYLASE in the last 168 hours. No results for input(s): AMMONIA in the last 168 hours. Coagulation Profile: Recent Labs  Lab 01/12/2018 1530    INR 1.04   Cardiac Enzymes: No results for input(s): CKTOTAL, CKMB, CKMBINDEX, TROPONINI in the last 168 hours. BNP (last 3 results) No results for input(s): PROBNP in the last 8760 hours. HbA1C: No results for input(s): HGBA1C in the last 72 hours. CBG: No results for input(s): GLUCAP in the last 168 hours. Lipid Profile: No results for input(s): CHOL, HDL, LDLCALC, TRIG, CHOLHDL, LDLDIRECT in the last 72 hours. Thyroid Function Tests: No results for input(s): TSH, T4TOTAL, FREET4, T3FREE, THYROIDAB in the last 72 hours. Anemia Panel: No results for input(s): VITAMINB12, FOLATE, FERRITIN, TIBC, IRON, RETICCTPCT in the last 72 hours. Urine analysis:    Component Value Date/Time   COLORURINE AMBER (A) 08/21/2015 0421   APPEARANCEUR CLEAR 08/21/2015 0421   LABSPEC 1.024 08/21/2015 0421   PHURINE 6.5 08/21/2015 0421   GLUCOSEU NEGATIVE 08/21/2015 0421   HGBUR NEGATIVE 08/21/2015 0421   BILIRUBINUR NEGATIVE 08/21/2015 0421   KETONESUR NEGATIVE 08/21/2015 0421   PROTEINUR NEGATIVE 08/21/2015 0421   UROBILINOGEN 0.2 08/03/2007 0928   NITRITE NEGATIVE 08/21/2015 0421   LEUKOCYTESUR NEGATIVE 08/21/2015 0421       M.D. [facility] 01/04/2018, 2:43 PM  Pager: [phone] Between 7am to 7pm - call Pager - [phone]  After 7pm go to [email or web address] - password [email or web address]  Call night coverage person covering after 7pm

## 2018-01-05 ENCOUNTER — Encounter (HOSPITAL_COMMUNITY): Payer: Self-pay

## 2018-01-05 MED ORDER — KETOROLAC TROMETHAMINE 30 MG/ML IJ SOLN
30.0000 mg | Freq: Three times a day (TID) | INTRAMUSCULAR | Status: DC
  Administered 2018-01-05: 30 mg via INTRAVENOUS
  Filled 2018-01-05: qty 1

## 2018-01-05 MED ORDER — MORPHINE BOLUS VIA INFUSION
2.0000 mg | INTRAVENOUS | Status: DC | PRN
  Filled 2018-01-05: qty 3

## 2018-01-13 NOTE — Progress Notes (Signed)
[facility]                                                                              Patient Demographics  [name], is a 84 y.o. male, DOB - 02-Dec-1925, VPX:[id number]  Admit date - 12/23/2017   Admitting Physician Kipp Brood, MD  Outpatient Primary MD for the patient is Lavone Orn, MD  Outpatient specialists:   LOS - 4  days   Medical records reviewed and are as summarized below:    Chief Complaint  Patient presents with  . Fall       Brief summary   Patient is a 84 year old male with advanced dementia, does not follow commands at baseline, BPH status post TURP, acute on chronic kidney disease, hypothyroidism presented to ED via EMS after found down by his wife by car status post mechanical fall and injuring right side of his head.  CT head showed large occipital hematoma with surrounding vasogenic edema, small amount of subarachnoid hemorrhage.  Patient was admitted to ICU. Patient was transferred to hospitalist service on 01/03/2018  Assessment & Plan    Principal Problem:   SDH (subdural hematoma) (HCC), subarachnoid hemorrhage status post mechanical fall-  -Initial CT head on admission showed left occipital lobe hematoma 5.6x 2.4x 3.8 cm with surrounding vasogenic edema, subdural hematoma measuring up to 4 mm thickness, small amount of subarachnoid blood left temporal region and occipital region. -Neurosurgery was consulted and recommended medical management given his age, comorbidities and advanced dementia. Patient was admitted to neuro ICU, started on neurochecks and Keppra, aspirin was discontinued -Repeat CT head showed increasing hemorrhage, now 7.6x 4.1x 4.9 cm with mass-effect, new intraventricular hemorrhage. palliative medicine consult was obtained, goals of care were discussed with patient's family -Shallow breathing, anticipate hospital death, son at the bedside, all questions addressed.  Continue morphine drip, scheduled Toradol  for pain.  Highly appreciate palliative medicine for the care.  Active Problems:   Benign prostatic hyperplasia with urinary retention -Now on comfort care status    Hypothyroidism -Comfort care status    New onset a-fib ([place]) -Patient was found to have new onset atrial fibrillation and was initially placed on Cardizem, now comfort care status    Acute respiratory failure with hypoxia ([place]) -Felt secondary to acute pulmonary edema, received 1 dose of Lasix -No further work-up as patient is on complete comfort care status   Code Status: DNR/DNI DVT Prophylaxis: Comfort care Family Communication: Discussed in detail with the patient, all imaging results, lab results explained to the patient's son at the bedside  Disposition Plan: Anticipated hospital death, poor prognosis, hours  Time Spent in minutes   20 minutes  Procedures:  CT head x2  Consultants:    Neurosurgery Patient was admitted by CCM Palliative medicine Antimicrobials:      Medications  Scheduled Meds: . ketorolac  30 mg Intravenous Q8H  . latanoprost  1 drop Both Eyes QHS   Continuous Infusions: . levETIRAcetam 500 mg (2018/01/10 0504)  . morphine 2 mg/hr (01/03/18 2012)   PRN Meds:.acetaminophen **OR** acetaminophen, glycopyrrolate, haloperidol **OR** haloperidol **OR** haloperidol lactate, LORazepam **OR** LORazepam **OR** LORazepam, morphine  Antibiotics   Anti-infectives (From admission, onward)   None        Subjective:   [name] was seen and examined today.  Unresponsive, shallow breathing on morphine drip.    Objective:   Vitals:   01/03/18 1200 01/03/18 1400 01/04/18 0522 01/04/18 2050  BP:   123/88 109/60  Pulse: 88 60 (!) 110 70  Resp: 18 (!) 24 18   Temp:   97.6 F (36.4 C) 98.5 F (36.9 C)  TempSrc:   Oral Oral  SpO2: 95% 94% (!) 68% 90%  Weight:       No intake or output data in the 24 hours ending 06-Jan-2018 1407   Wt Readings from Last 3 Encounters:    12/14/2017 57.1 kg  11/05/15 59.4 kg  11/02/15 59.4 kg    Physical Exam  General: Unresponsive, shallow breathing  Eyes:   HEENT:    Cardiovascular: Tachycardia  No pedal edema b/l  Respiratory: Apneic breathing  Gastrointestinal:   Ext: no pedal edema bilaterally  Neuro: unresponsive  Musculoskeletal: No digital cyanosis, clubbing  Skin: No rashes  Psych: unresponsive         Data Reviewed:  I have personally reviewed following labs and imaging studies  Micro Results Recent Results (from the past 240 hour(s))  MRSA PCR Screening     Status: None   Collection Time: 01/09/2018  8:33 PM  Result Value Ref Range Status   MRSA by PCR NEGATIVE NEGATIVE Final    Comment:        The GeneXpert MRSA Assay (FDA approved for NASAL specimens only), is one component of a comprehensive MRSA colonization surveillance program. It is not intended to diagnose MRSA infection nor to guide or monitor treatment for MRSA infections. Performed at [facility], [place] [address] [place], [place] [address]     Radiology Reports Dg Chest 1 View  Result Date: 01/06/2018 CLINICAL DATA:  Fall with pain EXAM: CHEST  1 VIEW COMPARISON:  08/29/2017 FINDINGS: Chronic cardiomegaly.  Aortic tortuosity. Large lung volumes and interstitial coarsening. No consolidation, effusion, or pneumothorax. No acute osseous finding as permitted by osteopenia and rotation. IMPRESSION: Cardiomegaly and borderline vascular congestion. Electronically Signed   By: Monte Fantasia M.D.   On: 12/16/2017 15:25   Ct Head Wo Contrast  Result Date: 01/02/2018 CLINICAL DATA:  84 y/o  M; intracranial hemorrhage for follow-up. EXAM: CT HEAD WITHOUT CONTRAST TECHNIQUE: Contiguous axial images were obtained from the base of the skull through the vertex without intravenous contrast. COMPARISON:  12/30/2017 CT head. FINDINGS: Brain: Increase size of hemorrhage within the left posterior temporal and occipital lobes  measuring 7.6 x 4.1 x 4.9 cm (volume = 80 cm^3). Interval partial decompression of the hematoma into the ventricular system with pooling of blood products in the occipital horns of the lateral ventricles. Increased small volume subarachnoid hemorrhage predominantly over the left convexity. Previously identified subdural hematoma over the left cerebral convexity is poorly visualized, probably due to mass effect from the adjacent brain. Mass effect results in 4 mm of left-to-right midline shift and partial effacement of the left lateral ventricle. No herniation at this time. Stable background of chronic microvascular ischemic changes and volume loss of the brain. Vascular: No hyperdense vessel or unexpected calcification. Skull: Normal. Negative for fracture or focal lesion. Sinuses/Orbits: Stable mucosal thickening of left frontal sinus and partial opacification of ethmoid air cells. Orbits are unremarkable. Other: None. IMPRESSION: 1. Increased hemorrhage within the left occipitotemporal lobe measuring  up to 7.6 cm, 80 cc. Previously up to 5.6 cm. 2. New intraventricular hemorrhage dependent lateral ventricles. 3. Increased small volume subarachnoid hemorrhage over the left convexity. 4. Previously identified thin left-sided subdural hematoma is poorly visualized, probably due to mass effect from the brain parenchymal hemorrhage. 5. Mass effect results in 4 mm of left-to-right midline shift and partial effacement of the left lateral ventricle. No herniation at this time. These results will be called to the ordering clinician or representative by the Radiologist Assistant, and communication documented in the PACS or zVision Dashboard. Electronically Signed   By: Kristine Garbe M.D.   On: 01/02/2018 06:04   Ct Head Wo Contrast  Result Date: 01/06/2018 CLINICAL DATA:  84 year old male post fall.  Initial encounter. EXAM: CT HEAD WITHOUT CONTRAST CT CERVICAL SPINE WITHOUT CONTRAST TECHNIQUE: Multidetector  CT imaging of the head and cervical spine was performed following the standard protocol without intravenous contrast. Multiplanar CT image reconstructions of the cervical spine were also generated. COMPARISON:  08/21/2015. FINDINGS: CT HEAD FINDINGS Brain: Left occipital lobe hematoma spanning over 5.6 x 2.4 x 3.8 cm with surrounding vasogenic edema. Broad-based left convexity subdural hematoma measuring up to 4 mm maximal thickness. Small amount of subarachnoid blood left temporal region and occipital region. Tiny posterior left parafalcine blood may be subarachnoid or subdural in location. Question artifact versus tiny amount of blood along the periphery of the right temporal lobe. Chronic microvascular changes. Global atrophy. No intracranial mass lesion noted on this unenhanced exam. Vascular: Vascular calcifications Skull: No skull fracture. Left nasal bone fracture of questionable age. Sinuses/Orbits: Post lens replacement. Globes appear to be grossly intact. Mucosal thickening/opacification left frontal sinus without surrounding fracture. Opacification ethmoid sinus air cells bilaterally. Polypoid opacification anterior sphenoid sinus. Other: Mastoid air cells and middle ear cavities are clear. CT CERVICAL SPINE FINDINGS Alignment: Curvature cervical spine. Skull base and vertebrae: No cervical spine fracture noted. Minimal interval loss of height T1 vertebral body predominantly involving the inferior endplate suggestive of small Schmorl's node deformity of indeterminate age. Soft tissues and spinal canal: No abnormal prevertebral soft tissue swelling. Disc levels: Multilevel cervical spondylotic changes with various degrees of spinal stenosis and foraminal narrowing. Upper chest: Bony overgrowth sternoclavicular region incompletely assessed. This was noted previously. Other: No worrisome neck mass. IMPRESSION: CT HEAD: 1. Left occipital lobe hematoma spanning over 5.6 x 2.4 x 3.8 cm with surrounding  vasogenic edema. 2. Broad-based left convexity subdural hematoma measuring up to 4 mm maximal thickness. 3. Small amount of subarachnoid blood left temporal region and occipital region. 4. Tiny posterior left parafalcine blood may be subarachnoid or subdural in location. 5. Question artifact versus tiny amount of blood along the periphery of the right temporal lobe. 6. Chronic microvascular changes. Global atrophy. 7. Left nasal bone fracture of questionable age. 8. Mucosal thickening/opacification left frontal sinus without surrounding fracture. Opacification ethmoid sinus air cells bilaterally. Polypoid opacification anterior sphenoid sinus. CT CERVICAL SPINE: 1. Curvature cervical spine. No cervical spine fracture or abnormal prevertebral soft tissue swelling noted. 2. Minimal interval loss of height T1 vertebral body predominantly involving the inferior endplate suggestive of small Schmorl's node deformity of indeterminate age. 3. Multilevel cervical spondylotic changes with various degrees of spinal stenosis and foraminal narrowing. These results were called by telephone at the time of interpretation on 01/09/2018 at 3:14 pm to Dr. Jola Schmidt , who verbally acknowledged these results. Electronically Signed   By: Genia Del M.D.   On: 12/28/2017 15:41  Ct Cervical Spine Wo Contrast  Result Date: 01/08/2018 CLINICAL DATA:  84 year old male post fall.  Initial encounter. EXAM: CT HEAD WITHOUT CONTRAST CT CERVICAL SPINE WITHOUT CONTRAST TECHNIQUE: Multidetector CT imaging of the head and cervical spine was performed following the standard protocol without intravenous contrast. Multiplanar CT image reconstructions of the cervical spine were also generated. COMPARISON:  08/21/2015. FINDINGS: CT HEAD FINDINGS Brain: Left occipital lobe hematoma spanning over 5.6 x 2.4 x 3.8 cm with surrounding vasogenic edema. Broad-based left convexity subdural hematoma measuring up to 4 mm maximal thickness. Small amount  of subarachnoid blood left temporal region and occipital region. Tiny posterior left parafalcine blood may be subarachnoid or subdural in location. Question artifact versus tiny amount of blood along the periphery of the right temporal lobe. Chronic microvascular changes. Global atrophy. No intracranial mass lesion noted on this unenhanced exam. Vascular: Vascular calcifications Skull: No skull fracture. Left nasal bone fracture of questionable age. Sinuses/Orbits: Post lens replacement. Globes appear to be grossly intact. Mucosal thickening/opacification left frontal sinus without surrounding fracture. Opacification ethmoid sinus air cells bilaterally. Polypoid opacification anterior sphenoid sinus. Other: Mastoid air cells and middle ear cavities are clear. CT CERVICAL SPINE FINDINGS Alignment: Curvature cervical spine. Skull base and vertebrae: No cervical spine fracture noted. Minimal interval loss of height T1 vertebral body predominantly involving the inferior endplate suggestive of small Schmorl's node deformity of indeterminate age. Soft tissues and spinal canal: No abnormal prevertebral soft tissue swelling. Disc levels: Multilevel cervical spondylotic changes with various degrees of spinal stenosis and foraminal narrowing. Upper chest: Bony overgrowth sternoclavicular region incompletely assessed. This was noted previously. Other: No worrisome neck mass. IMPRESSION: CT HEAD: 1. Left occipital lobe hematoma spanning over 5.6 x 2.4 x 3.8 cm with surrounding vasogenic edema. 2. Broad-based left convexity subdural hematoma measuring up to 4 mm maximal thickness. 3. Small amount of subarachnoid blood left temporal region and occipital region. 4. Tiny posterior left parafalcine blood may be subarachnoid or subdural in location. 5. Question artifact versus tiny amount of blood along the periphery of the right temporal lobe. 6. Chronic microvascular changes. Global atrophy. 7. Left nasal bone fracture of  questionable age. 8. Mucosal thickening/opacification left frontal sinus without surrounding fracture. Opacification ethmoid sinus air cells bilaterally. Polypoid opacification anterior sphenoid sinus. CT CERVICAL SPINE: 1. Curvature cervical spine. No cervical spine fracture or abnormal prevertebral soft tissue swelling noted. 2. Minimal interval loss of height T1 vertebral body predominantly involving the inferior endplate suggestive of small Schmorl's node deformity of indeterminate age. 3. Multilevel cervical spondylotic changes with various degrees of spinal stenosis and foraminal narrowing. These results were called by telephone at the time of interpretation on 12/19/2017 at 3:14 pm to Dr. Jola Schmidt , who verbally acknowledged these results. Electronically Signed   By: Genia Del M.D.   On: 12/29/2017 15:41   Dg Hip Unilat W Or Wo Pelvis 2-3 Views Right  Result Date: 12/15/2017 CLINICAL DATA:  Right hip pain after fall. EXAM: DG HIP (WITH OR WITHOUT PELVIS) 2-3V RIGHT COMPARISON:  None. FINDINGS: There is no evidence of hip fracture or dislocation. There is no evidence of arthropathy or other focal bone abnormality. IMPRESSION: Negative. Electronically Signed   By: Marijo Conception, M.D.   On: 01/07/2018 15:26    Lab Data:  CBC: Recent Labs  Lab 01/11/2018 1530 01/02/18 0420  WBC 6.9 10.4  HGB 13.1 13.1  HCT 40.5 40.1  MCV 103.8* 97.8  PLT 203 233   Basic Metabolic  Panel: Recent Labs  Lab 12/19/2017 1530 01/02/18 0420  NA 139 134*  K 3.8 3.8  CL 101 98  CO2 30 26  GLUCOSE 115* 193*  BUN 17 15  CREATININE 0.69 0.88  CALCIUM 8.7* 8.8*   GFR: CrCl cannot be calculated (Unknown ideal weight.). Liver Function Tests: Recent Labs  Lab 01/02/18 0420  AST 42*  ALT 32  ALKPHOS 66  BILITOT 0.9  PROT 6.6  ALBUMIN 3.5   No results for input(s): LIPASE, AMYLASE in the last 168 hours. No results for input(s): AMMONIA in the last 168 hours. Coagulation Profile: Recent Labs    Lab 01/07/2018 1530  INR 1.04   Cardiac Enzymes: No results for input(s): CKTOTAL, CKMB, CKMBINDEX, TROPONINI in the last 168 hours. BNP (last 3 results) No results for input(s): PROBNP in the last 8760 hours. HbA1C: No results for input(s): HGBA1C in the last 72 hours. CBG: No results for input(s): GLUCAP in the last 168 hours. Lipid Profile: No results for input(s): CHOL, HDL, LDLCALC, TRIG, CHOLHDL, LDLDIRECT in the last 72 hours. Thyroid Function Tests: No results for input(s): TSH, T4TOTAL, FREET4, T3FREE, THYROIDAB in the last 72 hours. Anemia Panel: No results for input(s): VITAMINB12, FOLATE, FERRITIN, TIBC, IRON, RETICCTPCT in the last 72 hours. Urine analysis:    Component Value Date/Time   COLORURINE AMBER (A) 08/21/2015 0421   APPEARANCEUR CLEAR 08/21/2015 0421   LABSPEC 1.024 08/21/2015 0421   PHURINE 6.5 08/21/2015 0421   GLUCOSEU NEGATIVE 08/21/2015 0421   HGBUR NEGATIVE 08/21/2015 0421   BILIRUBINUR NEGATIVE 08/21/2015 0421   KETONESUR NEGATIVE 08/21/2015 0421   PROTEINUR NEGATIVE 08/21/2015 0421   UROBILINOGEN 0.2 08/03/2007 0928   NITRITE NEGATIVE 08/21/2015 0421   LEUKOCYTESUR NEGATIVE 08/21/2015 0421       M.D. [facility] 2018-01-20, 2:07 PM  Pager: [phone] Between 7am to 7pm - call Pager - [phone]  After 7pm go to [email or web address] - password [email or web address]  Call night coverage person covering after 7pm

## 2018-01-13 NOTE — Death Summary Note (Signed)
DEATH SUMMARY   Patient Details  Name: [name] MRN: [id number] DOB: January 14, 1926  Admission/Discharge Information   Admit Date:  2018-01-04  Date of Death: Date of Death: (P) 01/08/2018  Time of Death: Time of Death: (P) 12-May-1746  Length of Stay: 4  Referring Physician: Lavone Orn, MD   Reason(s) for Hospitalization  Patient is a 84 year old male with advanced dementia, does not follow commands at baseline, BPH status post TURP, acute on chronic kidney disease, hypothyroidism presented to ED via EMS after found down by his wife by car status post mechanical fall and injuring right side of his head.  CT head showed large occipital hematoma with surrounding vasogenic edema, small amount of subarachnoid hemorrhage.  Patient was admitted to ICU. Patient was transferred to hospitalist service on 01/03/2018  Diagnoses  Preliminary cause of death: Acute respiratory failure with hypoxia Secondary Diagnoses (including complications and co-morbidities):     SDH (subdural hematoma) (HCC)    Benign prostatic hyperplasia with urinary retention   Hypothyroidism   Fall   New onset a-fib [facility])   Acute respiratory failure with hypoxia (HCC)   SAH (subarachnoid hemorrhage) (HCC)   Intraventricular hemorrhage ([place])   Brief Hospital Course (including significant findings, care, treatment, and services provided and events leading to death)    SDH (subdural hematoma) ([place]), subarachnoid hemorrhage status post mechanical fall-  -Initial CT head on admission showed left occipital lobe hematoma 5.6x 2.4x 3.8 cm with surrounding vasogenic edema, subdural hematoma measuring up to 4 mm thickness, small amount of subarachnoid blood left temporal region and occipital region. -Neurosurgery was consulted and recommended medical management given his age, comorbidities and advanced dementia. Patient was admitted to neuro ICU, started on neurochecks and Keppra, aspirin was discontinued -Repeat CT head showed  increasing hemorrhage, 7.6x 4.1x 4.9 cm with mass-effect, new intraventricular hemorrhage.  -palliative medicine consult was obtained, goals of care were discussed with patient's family -Family requested complete comfort care goals, DNR.   Acute respiratory failure with hypoxia (HCC) -Felt secondary to acute pulmonary edema, received 1 dose of Lasix -No further work-up was done as patient was placed on comfort care status    Benign prostatic hyperplasia with urinary retention - comfort care status    Hypothyroidism -Comfort care status    New onset a-fib [facility]) -Patient was found to have new onset atrial fibrillation and was initially placed on Cardizem, then placed on comfort care status   Patient passed on Jan 08, 2018 at 1748  Pertinent Labs and Studies  Significant Diagnostic Studies Dg Chest 1 View  Result Date: 01-04-18 CLINICAL DATA:  Fall with pain EXAM: CHEST  1 VIEW COMPARISON:  08/29/2017 FINDINGS: Chronic cardiomegaly.  Aortic tortuosity. Large lung volumes and interstitial coarsening. No consolidation, effusion, or pneumothorax. No acute osseous finding as permitted by osteopenia and rotation. IMPRESSION: Cardiomegaly and borderline vascular congestion. Electronically Signed   By: Monte Fantasia M.D.   On: 04-Jan-2018 15:25   Ct Head Wo Contrast  Result Date: 01/02/2018 CLINICAL DATA:  84 y/o  M; intracranial hemorrhage for follow-up. EXAM: CT HEAD WITHOUT CONTRAST TECHNIQUE: Contiguous axial images were obtained from the base of the skull through the vertex without intravenous contrast. COMPARISON:  January 04, 2018 CT head. FINDINGS: Brain: Increase size of hemorrhage within the left posterior temporal and occipital lobes measuring 7.6 x 4.1 x 4.9 cm (volume = 80 cm^3). Interval partial decompression of the hematoma into the ventricular system with pooling of blood products in the occipital horns of the lateral ventricles.  Increased small volume subarachnoid hemorrhage  predominantly over the left convexity. Previously identified subdural hematoma over the left cerebral convexity is poorly visualized, probably due to mass effect from the adjacent brain. Mass effect results in 4 mm of left-to-right midline shift and partial effacement of the left lateral ventricle. No herniation at this time. Stable background of chronic microvascular ischemic changes and volume loss of the brain. Vascular: No hyperdense vessel or unexpected calcification. Skull: Normal. Negative for fracture or focal lesion. Sinuses/Orbits: Stable mucosal thickening of left frontal sinus and partial opacification of ethmoid air cells. Orbits are unremarkable. Other: None. IMPRESSION: 1. Increased hemorrhage within the left occipitotemporal lobe measuring up to 7.6 cm, 80 cc. Previously up to 5.6 cm. 2. New intraventricular hemorrhage dependent lateral ventricles. 3. Increased small volume subarachnoid hemorrhage over the left convexity. 4. Previously identified thin left-sided subdural hematoma is poorly visualized, probably due to mass effect from the brain parenchymal hemorrhage. 5. Mass effect results in 4 mm of left-to-right midline shift and partial effacement of the left lateral ventricle. No herniation at this time. These results will be called to the ordering clinician or representative by the Radiologist Assistant, and communication documented in the PACS or zVision Dashboard. Electronically Signed   By: Kristine Garbe M.D.   On: 01/02/2018 06:04   Ct Head Wo Contrast  Result Date: 12/29/2017 CLINICAL DATA:  84 year old male post fall.  Initial encounter. EXAM: CT HEAD WITHOUT CONTRAST CT CERVICAL SPINE WITHOUT CONTRAST TECHNIQUE: Multidetector CT imaging of the head and cervical spine was performed following the standard protocol without intravenous contrast. Multiplanar CT image reconstructions of the cervical spine were also generated. COMPARISON:  08/21/2015. FINDINGS: CT HEAD FINDINGS  Brain: Left occipital lobe hematoma spanning over 5.6 x 2.4 x 3.8 cm with surrounding vasogenic edema. Broad-based left convexity subdural hematoma measuring up to 4 mm maximal thickness. Small amount of subarachnoid blood left temporal region and occipital region. Tiny posterior left parafalcine blood may be subarachnoid or subdural in location. Question artifact versus tiny amount of blood along the periphery of the right temporal lobe. Chronic microvascular changes. Global atrophy. No intracranial mass lesion noted on this unenhanced exam. Vascular: Vascular calcifications Skull: No skull fracture. Left nasal bone fracture of questionable age. Sinuses/Orbits: Post lens replacement. Globes appear to be grossly intact. Mucosal thickening/opacification left frontal sinus without surrounding fracture. Opacification ethmoid sinus air cells bilaterally. Polypoid opacification anterior sphenoid sinus. Other: Mastoid air cells and middle ear cavities are clear. CT CERVICAL SPINE FINDINGS Alignment: Curvature cervical spine. Skull base and vertebrae: No cervical spine fracture noted. Minimal interval loss of height T1 vertebral body predominantly involving the inferior endplate suggestive of small Schmorl's node deformity of indeterminate age. Soft tissues and spinal canal: No abnormal prevertebral soft tissue swelling. Disc levels: Multilevel cervical spondylotic changes with various degrees of spinal stenosis and foraminal narrowing. Upper chest: Bony overgrowth sternoclavicular region incompletely assessed. This was noted previously. Other: No worrisome neck mass. IMPRESSION: CT HEAD: 1. Left occipital lobe hematoma spanning over 5.6 x 2.4 x 3.8 cm with surrounding vasogenic edema. 2. Broad-based left convexity subdural hematoma measuring up to 4 mm maximal thickness. 3. Small amount of subarachnoid blood left temporal region and occipital region. 4. Tiny posterior left parafalcine blood may be subarachnoid or subdural  in location. 5. Question artifact versus tiny amount of blood along the periphery of the right temporal lobe. 6. Chronic microvascular changes. Global atrophy. 7. Left nasal bone fracture of questionable age. 8. Mucosal thickening/opacification left  frontal sinus without surrounding fracture. Opacification ethmoid sinus air cells bilaterally. Polypoid opacification anterior sphenoid sinus. CT CERVICAL SPINE: 1. Curvature cervical spine. No cervical spine fracture or abnormal prevertebral soft tissue swelling noted. 2. Minimal interval loss of height T1 vertebral body predominantly involving the inferior endplate suggestive of small Schmorl's node deformity of indeterminate age. 3. Multilevel cervical spondylotic changes with various degrees of spinal stenosis and foraminal narrowing. These results were called by telephone at the time of interpretation on 01/10/2018 at 3:14 pm to Dr. Jola Schmidt , who verbally acknowledged these results. Electronically Signed   By: Genia Del M.D.   On: 01/11/2018 15:41   Ct Cervical Spine Wo Contrast  Result Date: 01/11/2018 CLINICAL DATA:  84 year old male post fall.  Initial encounter. EXAM: CT HEAD WITHOUT CONTRAST CT CERVICAL SPINE WITHOUT CONTRAST TECHNIQUE: Multidetector CT imaging of the head and cervical spine was performed following the standard protocol without intravenous contrast. Multiplanar CT image reconstructions of the cervical spine were also generated. COMPARISON:  08/21/2015. FINDINGS: CT HEAD FINDINGS Brain: Left occipital lobe hematoma spanning over 5.6 x 2.4 x 3.8 cm with surrounding vasogenic edema. Broad-based left convexity subdural hematoma measuring up to 4 mm maximal thickness. Small amount of subarachnoid blood left temporal region and occipital region. Tiny posterior left parafalcine blood may be subarachnoid or subdural in location. Question artifact versus tiny amount of blood along the periphery of the right temporal lobe. Chronic  microvascular changes. Global atrophy. No intracranial mass lesion noted on this unenhanced exam. Vascular: Vascular calcifications Skull: No skull fracture. Left nasal bone fracture of questionable age. Sinuses/Orbits: Post lens replacement. Globes appear to be grossly intact. Mucosal thickening/opacification left frontal sinus without surrounding fracture. Opacification ethmoid sinus air cells bilaterally. Polypoid opacification anterior sphenoid sinus. Other: Mastoid air cells and middle ear cavities are clear. CT CERVICAL SPINE FINDINGS Alignment: Curvature cervical spine. Skull base and vertebrae: No cervical spine fracture noted. Minimal interval loss of height T1 vertebral body predominantly involving the inferior endplate suggestive of small Schmorl's node deformity of indeterminate age. Soft tissues and spinal canal: No abnormal prevertebral soft tissue swelling. Disc levels: Multilevel cervical spondylotic changes with various degrees of spinal stenosis and foraminal narrowing. Upper chest: Bony overgrowth sternoclavicular region incompletely assessed. This was noted previously. Other: No worrisome neck mass. IMPRESSION: CT HEAD: 1. Left occipital lobe hematoma spanning over 5.6 x 2.4 x 3.8 cm with surrounding vasogenic edema. 2. Broad-based left convexity subdural hematoma measuring up to 4 mm maximal thickness. 3. Small amount of subarachnoid blood left temporal region and occipital region. 4. Tiny posterior left parafalcine blood may be subarachnoid or subdural in location. 5. Question artifact versus tiny amount of blood along the periphery of the right temporal lobe. 6. Chronic microvascular changes. Global atrophy. 7. Left nasal bone fracture of questionable age. 8. Mucosal thickening/opacification left frontal sinus without surrounding fracture. Opacification ethmoid sinus air cells bilaterally. Polypoid opacification anterior sphenoid sinus. CT CERVICAL SPINE: 1. Curvature cervical spine. No  cervical spine fracture or abnormal prevertebral soft tissue swelling noted. 2. Minimal interval loss of height T1 vertebral body predominantly involving the inferior endplate suggestive of small Schmorl's node deformity of indeterminate age. 3. Multilevel cervical spondylotic changes with various degrees of spinal stenosis and foraminal narrowing. These results were called by telephone at the time of interpretation on 12/28/2017 at 3:14 pm to Dr. Jola Schmidt , who verbally acknowledged these results. Electronically Signed   By: Genia Del M.D.   On: 12/28/2017 15:41  Dg Hip Unilat W Or Wo Pelvis 2-3 Views Right  Result Date: 12/29/2017 CLINICAL DATA:  Right hip pain after fall. EXAM: DG HIP (WITH OR WITHOUT PELVIS) 2-3V RIGHT COMPARISON:  None. FINDINGS: There is no evidence of hip fracture or dislocation. There is no evidence of arthropathy or other focal bone abnormality. IMPRESSION: Negative. Electronically Signed   By: Marijo Conception, M.D.   On: 12/26/2017 15:26    Microbiology Recent Results (from the past 240 hour(s))  MRSA PCR Screening     Status: None   Collection Time: 12/15/2017  8:33 PM  Result Value Ref Range Status   MRSA by PCR NEGATIVE NEGATIVE Final    Comment:        The GeneXpert MRSA Assay (FDA approved for NASAL specimens only), is one component of a comprehensive MRSA colonization surveillance program. It is not intended to diagnose MRSA infection nor to guide or monitor treatment for MRSA infections. Performed at [facility], [place] [address], [place], [place] [address]     Lab Basic Metabolic Panel: Recent Labs  Lab 01/09/2018 1530 01/02/18 0420  NA 139 134*  K 3.8 3.8  CL 101 98  CO2 30 26  GLUCOSE 115* 193*  BUN 17 15  CREATININE 0.69 0.88  CALCIUM 8.7* 8.8*   Liver Function Tests: Recent Labs  Lab 01/02/18 0420  AST 42*  ALT 32  ALKPHOS 66  BILITOT 0.9  PROT 6.6  ALBUMIN 3.5   No results for input(s): LIPASE, AMYLASE in the last  168 hours. No results for input(s): AMMONIA in the last 168 hours. CBC: Recent Labs  Lab 01/06/2018 1530 01/02/18 0420  WBC 6.9 10.4  HGB 13.1 13.1  HCT 40.5 40.1  MCV 103.8* 97.8  PLT 203 187   Cardiac Enzymes: No results for input(s): CKTOTAL, CKMB, CKMBINDEX, TROPONINI in the last 168 hours. Sepsis Labs: Recent Labs  Lab 01/03/2018 1530 01/02/18 0420  WBC 6.9 10.4    Procedures/Operations  None     01/29/2018, 6:09 PM

## 2018-01-13 NOTE — Progress Notes (Signed)
Wasted 100 mL of Morphine with Willia Craze RN

## 2018-01-13 NOTE — Progress Notes (Signed)
Examined patient.    Mr. Doom son came into the room while I was at bedside.  We discussed his father's current status and his comfort.  PE:   Thin frail gentleman.  Eyes open but not seeing.  Slightly diaphoretic Resp: breathing is irregular and apneic CV: Tachy (120s) and irregular Abdomen thin, soft Skin: hot to touch  Prognosis:  Hours to days  Recommendation:   His pulse rate and diaphoresis make me concerned for discomfort.    Increase morphine gtt to 4 and will give 1 bolus of 2 mg now.    Will start scheduled Toradol as I am concerned for low grade fever  Wean oxygen to room air  Anticipate hospital death   Please call our office with any questions or if we can be of further support [phone].  Florentina Jenny, PA-C Palliative Medicine Pager: [phone]

## 2018-01-13 DEATH — deceased
